# Patient Record
Sex: Male | Born: 1965 | Race: Black or African American | Hispanic: No | Marital: Married | Smoking: Former smoker
Health system: Southern US, Community
[De-identification: ages and names within clinical notes are randomized; demographics above are authoritative.]

## PROBLEM LIST (undated history)

## (undated) DIAGNOSIS — M1990 Unspecified osteoarthritis, unspecified site: Secondary | ICD-10-CM

## (undated) DIAGNOSIS — I82409 Acute embolism and thrombosis of unspecified deep veins of unspecified lower extremity: Secondary | ICD-10-CM

## (undated) DIAGNOSIS — D869 Sarcoidosis, unspecified: Secondary | ICD-10-CM

## (undated) DIAGNOSIS — R011 Cardiac murmur, unspecified: Secondary | ICD-10-CM

## (undated) DIAGNOSIS — R079 Chest pain, unspecified: Secondary | ICD-10-CM

## (undated) DIAGNOSIS — Z9989 Dependence on other enabling machines and devices: Secondary | ICD-10-CM

## (undated) DIAGNOSIS — M545 Low back pain, unspecified: Secondary | ICD-10-CM

## (undated) DIAGNOSIS — M797 Fibromyalgia: Secondary | ICD-10-CM

## (undated) DIAGNOSIS — R9439 Abnormal result of other cardiovascular function study: Secondary | ICD-10-CM

## (undated) DIAGNOSIS — N183 Chronic kidney disease, stage 3 unspecified: Secondary | ICD-10-CM

## (undated) DIAGNOSIS — I38 Endocarditis, valve unspecified: Secondary | ICD-10-CM

## (undated) DIAGNOSIS — G4733 Obstructive sleep apnea (adult) (pediatric): Secondary | ICD-10-CM

## (undated) DIAGNOSIS — J45909 Unspecified asthma, uncomplicated: Secondary | ICD-10-CM

## (undated) DIAGNOSIS — G8929 Other chronic pain: Secondary | ICD-10-CM

## (undated) DIAGNOSIS — I319 Disease of pericardium, unspecified: Secondary | ICD-10-CM

## (undated) HISTORY — DX: Abnormal result of other cardiovascular function study: R94.39

## (undated) HISTORY — DX: Chest pain, unspecified: R07.9

---

## 2006-06-14 ENCOUNTER — Ambulatory Visit: Payer: Self-pay | Admitting: Vascular Surgery

## 2006-06-14 ENCOUNTER — Encounter: Payer: Self-pay | Admitting: Vascular Surgery

## 2006-06-14 ENCOUNTER — Inpatient Hospital Stay (HOSPITAL_COMMUNITY): Admission: EM | Admit: 2006-06-14 | Discharge: 2006-06-16 | Payer: Self-pay | Admitting: Emergency Medicine

## 2006-06-14 ENCOUNTER — Ambulatory Visit: Payer: Self-pay | Admitting: Internal Medicine

## 2006-06-21 ENCOUNTER — Encounter: Payer: Self-pay | Admitting: Internal Medicine

## 2006-06-21 ENCOUNTER — Ambulatory Visit: Payer: Self-pay | Admitting: Hospitalist

## 2006-06-21 LAB — CONVERTED CEMR LAB: INR: 8

## 2006-06-22 ENCOUNTER — Ambulatory Visit: Payer: Self-pay | Admitting: Internal Medicine

## 2006-06-22 ENCOUNTER — Encounter: Payer: Self-pay | Admitting: Internal Medicine

## 2006-06-22 DIAGNOSIS — I82409 Acute embolism and thrombosis of unspecified deep veins of unspecified lower extremity: Secondary | ICD-10-CM

## 2006-06-22 DIAGNOSIS — N189 Chronic kidney disease, unspecified: Secondary | ICD-10-CM | POA: Insufficient documentation

## 2006-06-22 DIAGNOSIS — D869 Sarcoidosis, unspecified: Secondary | ICD-10-CM

## 2006-06-22 LAB — CONVERTED CEMR LAB
BUN: 16 mg/dL (ref 6–23)
Bilirubin Urine: NEGATIVE
CO2: 29 meq/L (ref 19–32)
Calcium: 9.8 mg/dL (ref 8.4–10.5)
Chloride: 101 meq/L (ref 96–112)
Creatinine, Ser: 1.41 mg/dL (ref 0.40–1.50)
Glucose, Bld: 104 mg/dL — ABNORMAL HIGH (ref 70–99)
Hemoglobin, Urine: NEGATIVE
Ketones, ur: NEGATIVE mg/dL
Leukocytes, UA: NEGATIVE
Nitrite: NEGATIVE
Potassium: 4.6 meq/L (ref 3.5–5.3)
Protein, ur: NEGATIVE mg/dL
Sodium: 136 meq/L (ref 135–145)
Specific Gravity, Urine: 1.02 (ref 1.005–1.03)
Urine Glucose: NEGATIVE mg/dL
Urobilinogen, UA: 0.2 (ref 0.0–1.0)
pH: 7.5 (ref 5.0–8.0)

## 2006-06-25 ENCOUNTER — Ambulatory Visit: Payer: Self-pay | Admitting: Internal Medicine

## 2006-06-25 LAB — CONVERTED CEMR LAB: INR: 2.9

## 2006-07-02 ENCOUNTER — Ambulatory Visit: Payer: Self-pay | Admitting: Hospitalist

## 2006-07-02 LAB — CONVERTED CEMR LAB: INR: 7.6

## 2006-07-09 ENCOUNTER — Telehealth: Payer: Self-pay | Admitting: *Deleted

## 2006-07-16 ENCOUNTER — Ambulatory Visit: Payer: Self-pay | Admitting: Internal Medicine

## 2006-07-16 LAB — CONVERTED CEMR LAB: INR: 2

## 2006-07-27 ENCOUNTER — Ambulatory Visit: Payer: Self-pay | Admitting: Hospitalist

## 2006-07-27 LAB — CONVERTED CEMR LAB: INR: 2

## 2006-08-09 ENCOUNTER — Ambulatory Visit: Payer: Self-pay | Admitting: Internal Medicine

## 2006-08-09 LAB — CONVERTED CEMR LAB: INR: 4.1

## 2006-08-30 ENCOUNTER — Ambulatory Visit: Payer: Self-pay | Admitting: Internal Medicine

## 2006-08-30 LAB — CONVERTED CEMR LAB: INR: 3.8

## 2006-10-04 ENCOUNTER — Ambulatory Visit: Payer: Self-pay | Admitting: *Deleted

## 2006-10-04 LAB — CONVERTED CEMR LAB: INR: 4.8

## 2006-10-20 ENCOUNTER — Ambulatory Visit: Payer: Self-pay | Admitting: Infectious Disease

## 2006-10-20 LAB — CONVERTED CEMR LAB: INR: 3.2

## 2006-11-15 ENCOUNTER — Ambulatory Visit: Payer: Self-pay | Admitting: Internal Medicine

## 2006-11-15 LAB — CONVERTED CEMR LAB: INR: 2

## 2006-12-20 ENCOUNTER — Ambulatory Visit: Payer: Self-pay | Admitting: Infectious Diseases

## 2006-12-20 LAB — CONVERTED CEMR LAB: INR: 3.3

## 2007-01-17 ENCOUNTER — Ambulatory Visit: Payer: Self-pay | Admitting: *Deleted

## 2007-01-17 LAB — CONVERTED CEMR LAB: INR: 3

## 2007-02-21 ENCOUNTER — Ambulatory Visit: Payer: Self-pay | Admitting: Infectious Diseases

## 2007-02-21 LAB — CONVERTED CEMR LAB: INR: 3.2

## 2007-03-21 ENCOUNTER — Ambulatory Visit: Payer: Self-pay | Admitting: Internal Medicine

## 2007-03-21 LAB — CONVERTED CEMR LAB: INR: 1.9

## 2007-04-25 ENCOUNTER — Ambulatory Visit: Payer: Self-pay | Admitting: Hospitalist

## 2007-04-25 LAB — CONVERTED CEMR LAB: INR: 3.8

## 2007-07-18 ENCOUNTER — Ambulatory Visit: Payer: Self-pay | Admitting: Internal Medicine

## 2007-07-18 LAB — CONVERTED CEMR LAB: INR: 2.3

## 2007-10-17 ENCOUNTER — Ambulatory Visit: Admission: RE | Admit: 2007-10-17 | Discharge: 2007-10-17 | Payer: Self-pay | Admitting: Internal Medicine

## 2007-10-17 ENCOUNTER — Encounter (INDEPENDENT_AMBULATORY_CARE_PROVIDER_SITE_OTHER): Payer: Self-pay | Admitting: Internal Medicine

## 2007-10-17 ENCOUNTER — Ambulatory Visit: Payer: Self-pay | Admitting: Vascular Surgery

## 2007-10-17 ENCOUNTER — Ambulatory Visit: Payer: Self-pay | Admitting: Internal Medicine

## 2007-10-17 DIAGNOSIS — R609 Edema, unspecified: Secondary | ICD-10-CM | POA: Insufficient documentation

## 2007-10-17 LAB — CONVERTED CEMR LAB: INR: 2.1

## 2008-01-11 ENCOUNTER — Ambulatory Visit: Payer: Self-pay | Admitting: *Deleted

## 2008-01-11 ENCOUNTER — Encounter (INDEPENDENT_AMBULATORY_CARE_PROVIDER_SITE_OTHER): Payer: Self-pay | Admitting: Pharmacist

## 2008-01-11 LAB — CONVERTED CEMR LAB
HCT: 42.9 % (ref 39.0–52.0)
Hemoglobin: 14.4 g/dL (ref 13.0–17.0)
INR: 6
INR: 6 (ref 0.0–1.5)
MCHC: 33.6 g/dL (ref 30.0–36.0)
MCV: 95 fL (ref 78.0–100.0)
Platelets: 231 10*3/uL (ref 150–400)
Prothrombin Time: 59.3 s — ABNORMAL HIGH (ref 11.6–15.2)
RBC: 4.52 M/uL (ref 4.22–5.81)
RDW: 13.1 % (ref 11.5–15.5)
WBC: 3.7 10*3/uL — ABNORMAL LOW (ref 4.0–10.5)

## 2008-01-16 ENCOUNTER — Ambulatory Visit: Payer: Self-pay | Admitting: *Deleted

## 2008-01-16 LAB — CONVERTED CEMR LAB

## 2008-11-10 ENCOUNTER — Observation Stay (HOSPITAL_COMMUNITY): Admission: EM | Admit: 2008-11-10 | Discharge: 2008-11-11 | Payer: Self-pay | Admitting: Emergency Medicine

## 2010-03-09 HISTORY — PX: CATARACT EXTRACTION W/ INTRAOCULAR LENS  IMPLANT, BILATERAL: SHX1307

## 2010-03-09 HISTORY — PX: GLAUCOMA SURGERY: SHX656

## 2010-03-13 ENCOUNTER — Emergency Department (HOSPITAL_COMMUNITY)
Admission: EM | Admit: 2010-03-13 | Discharge: 2010-03-13 | Payer: Self-pay | Source: Home / Self Care | Admitting: Emergency Medicine

## 2010-03-13 LAB — DIFFERENTIAL
Basophils Absolute: 0 10*3/uL (ref 0.0–0.1)
Basophils Relative: 1 % (ref 0–1)
Eosinophils Absolute: 0.4 10*3/uL (ref 0.0–0.7)
Eosinophils Relative: 10 % — ABNORMAL HIGH (ref 0–5)
Lymphocytes Relative: 41 % (ref 12–46)
Lymphs Abs: 1.7 10*3/uL (ref 0.7–4.0)
Monocytes Absolute: 0.5 10*3/uL (ref 0.1–1.0)
Monocytes Relative: 12 % (ref 3–12)
Neutro Abs: 1.5 10*3/uL — ABNORMAL LOW (ref 1.7–7.7)
Neutrophils Relative %: 37 % — ABNORMAL LOW (ref 43–77)

## 2010-03-13 LAB — BASIC METABOLIC PANEL
BUN: 14 mg/dL (ref 6–23)
CO2: 26 mEq/L (ref 19–32)
Calcium: 9.4 mg/dL (ref 8.4–10.5)
Chloride: 105 mEq/L (ref 96–112)
Creatinine, Ser: 1.46 mg/dL (ref 0.4–1.5)
GFR calc Af Amer: >60 mL/min (ref >60)
GFR calc non Af Amer: 52 mL/min — ABNORMAL LOW (ref >60)
Glucose, Bld: 104 mg/dL — ABNORMAL HIGH (ref 70–99)
Potassium: 4.1 mEq/L (ref 3.5–5.1)
Sodium: 138 mEq/L (ref 135–145)

## 2010-03-13 LAB — CBC
HCT: 39 % (ref 39.0–52.0)
Hemoglobin: 14.6 g/dL (ref 13.0–17.0)
MCH: 33.7 pg (ref 26.0–34.0)
MCHC: 37.4 g/dL — ABNORMAL HIGH (ref 30.0–36.0)
MCV: 90.1 fL (ref 78.0–100.0)
Platelets: 223 10*3/uL (ref 150–400)
RBC: 4.33 MIL/uL (ref 4.22–5.81)
RDW: 12.7 % (ref 11.5–15.5)
WBC: 4 10*3/uL (ref 4.0–10.5)

## 2010-03-13 LAB — PROTIME-INR
INR: 1.98 — ABNORMAL HIGH (ref 0.00–1.49)
Prothrombin Time: 22.7 seconds — ABNORMAL HIGH (ref 11.6–15.2)

## 2010-06-13 LAB — DIFFERENTIAL
Basophils Absolute: 0 10*3/uL (ref 0.0–0.1)
Basophils Relative: 1 % (ref 0–1)
Eosinophils Absolute: 0.5 10*3/uL (ref 0.0–0.7)
Eosinophils Relative: 13 % — ABNORMAL HIGH (ref 0–5)
Lymphocytes Relative: 24 % (ref 12–46)
Lymphocytes Relative: 24 % (ref 12–46)
Lymphs Abs: 1 10*3/uL (ref 0.7–4.0)
Monocytes Absolute: 0.5 10*3/uL (ref 0.1–1.0)
Monocytes Absolute: 0.9 10*3/uL (ref 0.1–1.0)
Monocytes Relative: 12 % (ref 3–12)
Monocytes Relative: 13 % — ABNORMAL HIGH (ref 3–12)
Neutro Abs: 2.2 10*3/uL (ref 1.7–7.7)
Neutro Abs: 4 10*3/uL (ref 1.7–7.7)
Neutrophils Relative %: 51 % (ref 43–77)
Neutrophils Relative %: 61 % (ref 43–77)

## 2010-06-13 LAB — URINALYSIS, ROUTINE W REFLEX MICROSCOPIC
Bilirubin Urine: NEGATIVE
Glucose, UA: NEGATIVE mg/dL
Hgb urine dipstick: NEGATIVE
Ketones, ur: NEGATIVE mg/dL
Nitrite: NEGATIVE
Protein, ur: NEGATIVE mg/dL
Specific Gravity, Urine: 1.027 (ref 1.005–1.030)
Urobilinogen, UA: 0.2 mg/dL (ref 0.0–1.0)
pH: 6 (ref 5.0–8.0)

## 2010-06-13 LAB — CK TOTAL AND CKMB (NOT AT ARMC)
CK, MB: 4.2 ng/mL — ABNORMAL HIGH (ref 0.3–4.0)
Relative Index: 1.5 (ref 0.0–2.5)
Total CK: 287 U/L — ABNORMAL HIGH (ref 7–232)

## 2010-06-13 LAB — CARDIAC PANEL(CRET KIN+CKTOT+MB+TROPI)
CK, MB: 3.5 ng/mL (ref 0.3–4.0)
Relative Index: 1.6 (ref 0.0–2.5)
Relative Index: 1.9 (ref 0.0–2.5)
Troponin I: 0.01 ng/mL (ref 0.00–0.06)
Troponin I: 0.02 ng/mL (ref 0.00–0.06)

## 2010-06-13 LAB — COMPREHENSIVE METABOLIC PANEL
Albumin: 3.2 g/dL — ABNORMAL LOW (ref 3.5–5.2)
BUN: 19 mg/dL (ref 6–23)
Calcium: 9.1 mg/dL (ref 8.4–10.5)
Creatinine, Ser: 1.33 mg/dL (ref 0.4–1.5)
Glucose, Bld: 146 mg/dL — ABNORMAL HIGH (ref 70–99)
Total Protein: 6.6 g/dL (ref 6.0–8.3)

## 2010-06-13 LAB — PROTIME-INR
INR: 2.6 — ABNORMAL HIGH (ref 0.00–1.49)
INR: 3.1 — ABNORMAL HIGH (ref 0.00–1.49)
Prothrombin Time: 27.6 seconds — ABNORMAL HIGH (ref 11.6–15.2)
Prothrombin Time: 31.6 seconds — ABNORMAL HIGH (ref 11.6–15.2)

## 2010-06-13 LAB — CBC
HCT: 37 % — ABNORMAL LOW (ref 39.0–52.0)
HCT: 42.3 % (ref 39.0–52.0)
Hemoglobin: 14.7 g/dL (ref 13.0–17.0)
MCHC: 34.6 g/dL (ref 30.0–36.0)
MCHC: 34.7 g/dL (ref 30.0–36.0)
MCV: 93.5 fL (ref 78.0–100.0)
MCV: 93.7 fL (ref 78.0–100.0)
Platelets: 304 10*3/uL (ref 150–400)
Platelets: 328 10*3/uL (ref 150–400)
RBC: 4.52 MIL/uL (ref 4.22–5.81)
RDW: 13.3 % (ref 11.5–15.5)
RDW: 13.6 % (ref 11.5–15.5)
WBC: 4.3 10*3/uL (ref 4.0–10.5)

## 2010-06-13 LAB — POCT CARDIAC MARKERS
CKMB, poc: 2.4 ng/mL (ref 1.0–8.0)
CKMB, poc: 3.2 ng/mL (ref 1.0–8.0)
CKMB, poc: 4.6 ng/mL (ref 1.0–8.0)
Myoglobin, poc: 185 ng/mL (ref 12–200)
Myoglobin, poc: 227 ng/mL (ref 12–200)
Troponin i, poc: <0.05 ng/mL (ref 0.00–0.09)
Troponin i, poc: <0.05 ng/mL (ref 0.00–0.09)

## 2010-06-13 LAB — BRAIN NATRIURETIC PEPTIDE: Pro B Natriuretic peptide (BNP): <30 pg/mL (ref 0.0–100.0)

## 2010-06-13 LAB — BASIC METABOLIC PANEL
BUN: 17 mg/dL (ref 6–23)
CO2: 28 mEq/L (ref 19–32)
Calcium: 9.7 mg/dL (ref 8.4–10.5)
Chloride: 104 mEq/L (ref 96–112)
Creatinine, Ser: 1.29 mg/dL (ref 0.4–1.5)
GFR calc Af Amer: >60 mL/min (ref >60)
GFR calc non Af Amer: >60 mL/min (ref >60)
Glucose, Bld: 105 mg/dL — ABNORMAL HIGH (ref 70–99)
Potassium: 4.4 mEq/L (ref 3.5–5.1)
Sodium: 139 mEq/L (ref 135–145)

## 2010-07-25 NOTE — Discharge Summary (Signed)
Richard Shepherd, Richard Shepherd             ACCOUNT NO.:  [id number]   MEDICAL RECORD NO.:  [id number]          PATIENT TYPE:  INP   LOCATION:  5511                         FACILITY:  [facility]   PHYSICIAN:  Richard Shepherd, M.D.DATE OF BIRTH:  [birth year]   DATE OF ADMISSION:  06/14/2006  DATE OF DISCHARGE:  06/16/2006                               DISCHARGE SUMMARY   DISCHARGE DIAGNOSIS:  Deep vein thrombosis of the right lower extremity,  namely the popliteal vein to distal femoral vein.   CHRONIC DIAGNOSES:  1. Sarcoidosis diagnosed in 1998, with complications including uveitis      and has been prednisone dependent.  2. Elevated creatinine which resolved.   DISCHARGE MEDICATIONS:  1. Lovenox 105 mg subcutaneous injection once every 12 hours until his      Monday appointment with Dr. Alexandria Shepherd.  2. Coumadin 10 mg by mouth once a day also until his Monday      appointment with Dr. Alexandria Shepherd in the Coumadin clinic at which point      his Coumadin dose will be adjusted and it will be determined      whether he needs to continue his Lovenox dose.  3. Prednisone 20 mg by mouth once a day.  4. Homatropine 5% ophthalmic solution 2 drops in each eye twice a day.  5. Percocet 5/325 one tablet by mouth once every 4-6 hours as needed      for pain.   DISPOSITION AND FOLLOWUP:  [name] was stable at discharge with  easing of the pain in his right lower extremity.  Since this is his  second DVT, he had a prior unmanipulated deep vein thrombosis in July  2007 for which he had 6 months of treatment with Coumadin.  He will  require life-long anticoagulation most likely.  He is to follow up with  Dr. Alexandria Shepherd in the outpatient clinic on Monday, April 14th, at 2:45 p.m.  At this time, he will have a repeat PT/INR check to determine if his  Coumadin levels are therapeutic and then it will be determined if he  needs to remain on Lovenox for 2 additional days and what dose of  Coumadin he can be continued on  will be determined in the future.  He  will also see Dr. Phillips Shepherd in the outpatient clinic on Tuesday, April  15th, at 1:30 p.m. to establish care.  Also, he will have a recheck of  his BMET to make sure that his kidney function has remained within  normal limits since his discharge.  It should also be determined if he  has had resolution of his urinary symptoms.  He had been complaining of  polyuria and incomplete emptying on voiding without any pain and he has  never had kidney stones.  Did have a limited workup of his acute renal  failure including a UA which was negative, a FeNa which was 0.8, and  urinary calcium check which was only 5 as, with sarcoidosis, there could  be an associated renal involvement, though usually with hypercalcemia  which he did not have.  Also at this time, it  can be seen if we have  been able to obtain his records from the [facility] in [place], where he  used to have his care looked after.  They were transferring charts but  it is very likely that it will not arrive as they said it would have a  lag time of up to 2 weeks.  Also, it can be determined at this time if  he will be following up with the [facility] outpatient clinic in [place]  and we can also, finally at this time, determine if at any point he will  need to have a repeat chest x-ray done to assess his sarcoidosis and we  will need to make sure that he is seeing an ophthalmologist as he has  not seen one within the last 7 months and this is someone that he needs  to follow up with urgently in reference to his uveitis.  No procedures  were performed or consultations obtained during this hospitalization.   BRIEF ADMITTING HISTORY AND PHYSICAL:  [name] is a 45 year old  male, with a past medical history of sarcoidosis requiring chronic  prednisone therapy and a deep vein thrombosis in July 2007 requiring 6  months of Coumadin therapy, who comes in complaining of right leg  swelling and cramps when  walking that occurred the morning of admission.  The pain begins in his calf and goes up to the thigh and there is also  an associated numbness.  Denies any other respiratory symptoms.   Temperature 97.8, blood pressure 135/84, pulse 78, respiratory rate 16,  O2 sat 96% on room air.  He was in no apparent distress.  EYES:  Were anicteric.  He had no pallor.  LUNGS:  Sounds were clear to auscultation bilaterally.  No wheezes, no  crackles.  Good air movement.  No evidence of respiratory distress.  CARDIOVASCULAR EXAM:  Regular rate and rhythm, S1, S2, no murmurs.  EXTREMITIES:  He was noted to have right leg swelling with positive  Homans sign and intact pulses and his calf was painful to palpitation in  the popliteal region but he did not have a palpable cord or evidence of  erythema.   Sodium 137, potassium 4.3, chloride 105, bicarb 30 for an anion gap of  2, BUN 13, creatinine was 1.9, glucose 104, hemoglobin 16.2, MCV 96.2,  platelets 270,000, white blood cell count 5.9, PT 12.3, INR 0.9, PTT 28.   For more detailed history and physical, please refer to the chart.   HOSPITAL COURSE:  1. Right leg tenderness and swelling, positive Homans sign.  An      ultrasound was obtained which showed a fairly large DVT in the      popliteal region extending to the femoral vein.  He was started on      Lovenox and Coumadin while keeping his leg elevated.  A      hypercoagulable panel was obtained which was essentially      inconclusive.  Did not show any potential cause for his repeat      DVTs.  The only notable labs were his total protein S was mildly      elevated at 200 and his functional protein C was mildly elevated at      152.  No real explanation for his repeat DVT.  With his pain      resolving and his receiving therapy and being agreeable to      continuing Lovenox on an outpatient basis until  early followup, it     was decided that he could be discharged home.  During his hospital       course, he did not have any symptoms of PE.  He had no shortness of      breath, no tachycardia.  An EKG was within normal limits.  No      evidence of right heart strain.  He was advised that, should he      develop any of these symptoms, to return to the hospital urgently.  2. Elevated creatinine.  When he came, it was 1.9, however, he had      been urinating without difficulty, with adequate urine output.  He      did have some polyuria, incomplete emptying the week prior but,      without IV hydration, his creatinine trended down during his      hospital course.  A workup was obtained to see if there was an      association with a sarcoidosis but urine calcium was negative, UA      was negative, FeNa was within normal limits and, again, his      creatinine resolved with no hydration.  This is something that we      will need to follow up with on an outpatient basis just to confirm      that he is not having any further difficulty with his kidneys.  Of      note was that he was taking multiple weight-gaining and muscle      building substances, which names he did not recall.  This is just      possibly associated with his elevated creatinine and he was advised      to stop taking any of these herbal supplements in the future.  3. Sarcoidosis, prednisone dependent, with history of uveitis.  He is      running out of his eye solution and his vision is somewhat blurry      and he states that, whenever he goes off of his prednisone, he      suffers from fatigue and severe arthritis.  He was continued on his      prednisone dose and was advised to obtain an ophthalmology      consultation at his earliest convenience as he does have insurance.      He can make the appointment on his own.  We will need to follow up      with him on this when he returns to the outpatient clinic because      this is something that he needs to have done urgently.  We also      attempted to obtain records from  the [facility] but they will not arrive      for another 2 weeks after his discharge.   DISCHARGE LABS:  Sodium 138, potassium 4.1, chloride 102, bicarb 31,  glucose 99, BUN 13, creatinine 1.5 for a GFR above 60, calcium 9.3.  White blood cell count 5.4, hemoglobin 15.5, hematocrit 45, platelets  283,000, PT 15.2, INR 1.2.  A urine culture returned negative.   VITAL SIGNS:  Temperature 97.9, pulse 75, respirations 20, blood  pressure 140/85, O2 saturation 97% on room air.      Valetta Close, M.D.  Electronically Signed      Richard Shepherd, M.D.  Electronically Signed    JC/MEDQ  D:  06/18/2006  T:  06/18/2006  Job:  [id number]  cc:   Edsel Petrin, D.O.

## 2011-01-28 ENCOUNTER — Emergency Department (HOSPITAL_COMMUNITY)
Admission: EM | Admit: 2011-01-28 | Discharge: 2011-01-29 | Disposition: A | Discharge status: Home / Self Care | Payer: Medicare Other | Attending: Emergency Medicine | Admitting: Emergency Medicine

## 2011-01-28 DIAGNOSIS — M79606 Pain in leg, unspecified: Secondary | ICD-10-CM

## 2011-01-28 DIAGNOSIS — M79609 Pain in unspecified limb: Secondary | ICD-10-CM

## 2011-01-28 DIAGNOSIS — IMO0001 Reserved for inherently not codable concepts without codable children: Secondary | ICD-10-CM | POA: Insufficient documentation

## 2011-01-28 DIAGNOSIS — Z86718 Personal history of other venous thrombosis and embolism: Secondary | ICD-10-CM | POA: Insufficient documentation

## 2011-01-28 DIAGNOSIS — D869 Sarcoidosis, unspecified: Secondary | ICD-10-CM | POA: Insufficient documentation

## 2011-01-28 DIAGNOSIS — Z79899 Other long term (current) drug therapy: Secondary | ICD-10-CM | POA: Insufficient documentation

## 2011-01-28 DIAGNOSIS — M6282 Rhabdomyolysis: Secondary | ICD-10-CM | POA: Insufficient documentation

## 2011-01-28 DIAGNOSIS — M7989 Other specified soft tissue disorders: Secondary | ICD-10-CM

## 2011-01-28 DIAGNOSIS — Z7901 Long term (current) use of anticoagulants: Secondary | ICD-10-CM | POA: Insufficient documentation

## 2011-01-28 DIAGNOSIS — M2550 Pain in unspecified joint: Secondary | ICD-10-CM | POA: Insufficient documentation

## 2011-01-28 DIAGNOSIS — Z9889 Other specified postprocedural states: Secondary | ICD-10-CM | POA: Insufficient documentation

## 2011-01-28 HISTORY — DX: Sarcoidosis, unspecified: D86.9

## 2011-01-28 LAB — CBC
HCT: 43 % (ref 39.0–52.0)
MCV: 90.7 fL (ref 78.0–100.0)
Platelets: 244 10*3/uL (ref 150–400)
RBC: 4.74 MIL/uL (ref 4.22–5.81)
RDW: 13.4 % (ref 11.5–15.5)
WBC: 3.6 10*3/uL — ABNORMAL LOW (ref 4.0–10.5)

## 2011-01-28 LAB — PROTIME-INR: INR: 2.37 — ABNORMAL HIGH (ref 0.00–1.49)

## 2011-01-28 LAB — CK: Total CK: 768 U/L — ABNORMAL HIGH (ref 7–232)

## 2011-01-28 LAB — BASIC METABOLIC PANEL
CO2: 28 mEq/L (ref 19–32)
Calcium: 9.3 mg/dL (ref 8.4–10.5)
GFR calc non Af Amer: 59 mL/min — ABNORMAL LOW (ref >90)
Glucose, Bld: 97 mg/dL (ref 70–99)
Potassium: 4.4 mEq/L (ref 3.5–5.1)
Sodium: 137 mEq/L (ref 135–145)

## 2011-01-28 MED ORDER — SODIUM CHLORIDE 0.9 % IV BOLUS (SEPSIS): 1000.0000 mL | Freq: Once | INTRAVENOUS | Status: DC

## 2011-01-28 NOTE — ED Notes (Signed)
Dr. Manus Gunning notified re: elevated total CK. Order received for NS bolus.

## 2011-01-28 NOTE — ED Notes (Signed)
Pt taking water PO.

## 2011-01-28 NOTE — Progress Notes (Signed)
Bilateral lower extremity venous duplex completed - No evidence of lower extremity DVT, superficial thrombosis, or Baker's cyst bilaterally.  Richard Shepherd, [place] D 01/28/2011, 2:24 PM

## 2011-01-28 NOTE — ED Provider Notes (Signed)
History     CSN: [id number] Arrival date & time: 01/28/2011  1:11 PM   First MD Initiated Contact with Patient 01/28/11 1333      Chief Complaint  Patient presents with  . Leg Pain    (Consider location/radiation/quality/duration/timing/severity/associated sxs/prior treatment) HPI Comments: Patient presents with soreness and pain in his left calf for the past 3 days. He denies any trauma. He is worried he has a DVT as he has a history of DVT on the right side. He is currently on Coumadin and followed at the [facility] in [place]. He states his INR was decreased at 1.4 a week ago. He denies any chest pain, shortness of breath, cough or fever. Denies any weakness, numbness or tingling to lower 70s. He is able to ambulate without difficulty.  The history is provided by the patient.    Past Medical History  Diagnosis Date  . DVT (deep vein thrombosis) in pregnancy   . Sarcoidosis     Past Surgical History  Procedure Date  . Glaucoma surgery   . Cataract extraction     History reviewed. No pertinent family history.  History  Substance Use Topics  . Smoking status: Never Smoker   . Smokeless tobacco: Not on file  . Alcohol Use: Yes      Review of Systems  Constitutional: Negative for fever, activity change and appetite change.  HENT: Negative for congestion and rhinorrhea.   Respiratory: Negative for cough and shortness of breath.   Cardiovascular: Negative for chest pain.  Gastrointestinal: Negative for nausea, vomiting and diarrhea.  Genitourinary: Negative for dysuria.  Musculoskeletal: Positive for myalgias and arthralgias. Negative for back pain and gait problem.  Neurological: Negative for headaches.    Allergies  Review of patient's allergies indicates no known allergies.  Home Medications   Current Outpatient Rx  Name Route Sig Dispense Refill  . CALCIUM PO Oral Take 1 tablet by mouth daily.      Marland Kitchen VITAMIN D3 1000 UNITS PO CAPS Oral Take 1 capsule by  mouth daily.      Marland Kitchen GABAPENTIN 600 MG PO TABS Oral Take 1,200 mg by mouth at bedtime.      Marland Kitchen HOMATROPINE HBR 2 % OP SOLN Both Eyes Place 2 drops into both eyes 2 (two) times daily.      Marland Kitchen NAPROXEN SODIUM 220 MG PO TABS Oral Take 220 mg by mouth 2 (two) times daily as needed. For pain     . PREDNISOLONE ACETATE 1 % OP SUSP Both Eyes Place 1 drop into both eyes 6 (six) times daily.      . TRAMADOL HCL 50 MG PO TABS Oral Take 50 mg by mouth every 6 (six) hours as needed. Maximum dose= 8 tablets per day For pain      . WARFARIN SODIUM 5 MG PO TABS Oral Take 5 mg by mouth daily.        BP 141/91  Pulse 65  Temp(Src) 97 F (36.1 C) (Oral)  Resp 18  Ht 5\' 11"  (1.803 m)  Wt 250 lb (113.399 kg)  BMI 34.87 kg/m2  SpO2 99%  Physical Exam  Constitutional: He is oriented to person, place, and time. He appears well-developed and well-nourished. No distress.  HENT:  Head: Normocephalic and atraumatic.  Mouth/Throat: Oropharynx is clear and moist. No oropharyngeal exudate.  Eyes: Conjunctivae are normal. Pupils are equal, round, and reactive to light.  Neck: Normal range of motion.  Cardiovascular: Normal rate, regular rhythm and normal heart  sounds.   Pulmonary/Chest: Effort normal and breath sounds normal.  Abdominal: Soft. Bowel sounds are normal. There is no tenderness. There is no rebound and no guarding.  Musculoskeletal: Normal range of motion. He exhibits tenderness.       There is no appreciable asymmetry lower extremities.  He does have bilateral calf tenderness. He is able to wiggle his toes, +2 DP and PT pulses bilaterally.  Neurological: He is alert and oriented to person, place, and time. No cranial nerve deficit.  Skin: Skin is warm.    ED Course  Procedures (including critical care time)  Labs Reviewed  CBC - Abnormal; Notable for the following:    WBC 3.6 (*)    All other components within normal limits  PROTIME-INR - Abnormal; Notable for the following:    Prothrombin  Time 26.3 (*)    INR 2.37 (*)    All other components within normal limits  BASIC METABOLIC PANEL - Abnormal; Notable for the following:    Creatinine, Ser 1.40 (*)    GFR calc non Af Amer 59 (*)    GFR calc Af Amer 69 (*)    All other components within normal limits  CK - Abnormal; Notable for the following:    Total CK 768 (*)    All other components within normal limits   No results found.   1. Leg pain   2. Rhabdomyolysis       MDM  Lower extremity pain with history of DVT. Patient reports last Coumadin level subtherapeutic. We'll obtain a duplex imaging of lower extremities, check INR.  He is not having chest pain or shortness of breath, tachypnea or hypoxia to suggest PE.  No evidence of DVT on Doppler. Coumadin is therapeutic at 2.37. Elevated CK noted.  Creatinine is stable at 1.4.  The patient is elevated CK concerning for rhabdomyolysis and likely contributing to his pain.  He declines IV fluids in the emergency department and states she will drink by mouth.  He has a follow up appointment scheduled at the [facility] in [place] on the 26th. He is instructed that he needs to have his creatinine and CK rechecked at that time.  He should return to the ED with any new or worsening symptoms.     Glynn Octave, MD 01/28/11 2024

## 2011-01-28 NOTE — ED Notes (Signed)
Pt presents with 3 day h/o L leg pain and swelling.  Pt has h/o DVT to R leg, is on coumadin for same.

## 2011-01-28 NOTE — ED Notes (Signed)
slight edema in left ankle

## 2012-11-22 DIAGNOSIS — Z961 Presence of intraocular lens: Secondary | ICD-10-CM | POA: Insufficient documentation

## 2013-03-09 HISTORY — PX: EYE SURGERY: SHX253

## 2013-08-16 ENCOUNTER — Emergency Department (INDEPENDENT_AMBULATORY_CARE_PROVIDER_SITE_OTHER)
Admission: EM | Admit: 2013-08-16 | Discharge: 2013-08-16 | Disposition: A | Discharge status: Nursing Home (Includes ICF) | Payer: Medicare Other | Source: Home / Self Care | Attending: Emergency Medicine | Admitting: Emergency Medicine

## 2013-08-16 ENCOUNTER — Emergency Department (HOSPITAL_COMMUNITY): Payer: Medicare Other

## 2013-08-16 ENCOUNTER — Inpatient Hospital Stay (HOSPITAL_COMMUNITY)
Admission: EM | Admit: 2013-08-16 | Discharge: 2013-08-18 | DRG: 316 | Disposition: A | Discharge status: Home / Self Care | Payer: Medicare Other | Attending: Cardiovascular Disease | Admitting: Cardiovascular Disease

## 2013-08-16 ENCOUNTER — Encounter (HOSPITAL_COMMUNITY)
Admission: EM | Disposition: A | Discharge status: Home / Self Care | Payer: Self-pay | Source: Home / Self Care | Attending: Cardiovascular Disease

## 2013-08-16 ENCOUNTER — Encounter (HOSPITAL_COMMUNITY): Payer: Self-pay | Admitting: Emergency Medicine

## 2013-08-16 DIAGNOSIS — J45909 Unspecified asthma, uncomplicated: Secondary | ICD-10-CM | POA: Diagnosis present

## 2013-08-16 DIAGNOSIS — I219 Acute myocardial infarction, unspecified: Secondary | ICD-10-CM

## 2013-08-16 DIAGNOSIS — B029 Zoster without complications: Secondary | ICD-10-CM | POA: Diagnosis present

## 2013-08-16 DIAGNOSIS — Z79899 Other long term (current) drug therapy: Secondary | ICD-10-CM

## 2013-08-16 DIAGNOSIS — I319 Disease of pericardium, unspecified: Principal | ICD-10-CM | POA: Diagnosis present

## 2013-08-16 DIAGNOSIS — R609 Edema, unspecified: Secondary | ICD-10-CM | POA: Diagnosis present

## 2013-08-16 DIAGNOSIS — N189 Chronic kidney disease, unspecified: Secondary | ICD-10-CM | POA: Diagnosis present

## 2013-08-16 DIAGNOSIS — D869 Sarcoidosis, unspecified: Secondary | ICD-10-CM | POA: Diagnosis present

## 2013-08-16 DIAGNOSIS — R079 Chest pain, unspecified: Secondary | ICD-10-CM | POA: Diagnosis present

## 2013-08-16 DIAGNOSIS — G589 Mononeuropathy, unspecified: Secondary | ICD-10-CM | POA: Diagnosis present

## 2013-08-16 DIAGNOSIS — Z7901 Long term (current) use of anticoagulants: Secondary | ICD-10-CM

## 2013-08-16 DIAGNOSIS — Z86718 Personal history of other venous thrombosis and embolism: Secondary | ICD-10-CM

## 2013-08-16 DIAGNOSIS — N182 Chronic kidney disease, stage 2 (mild): Secondary | ICD-10-CM | POA: Diagnosis present

## 2013-08-16 DIAGNOSIS — I213 ST elevation (STEMI) myocardial infarction of unspecified site: Secondary | ICD-10-CM

## 2013-08-16 DIAGNOSIS — Z9849 Cataract extraction status, unspecified eye: Secondary | ICD-10-CM

## 2013-08-16 HISTORY — DX: Unspecified asthma, uncomplicated: J45.909

## 2013-08-16 HISTORY — DX: Acute embolism and thrombosis of unspecified deep veins of unspecified lower extremity: I82.409

## 2013-08-16 LAB — CBC
HCT: 35 % — ABNORMAL LOW (ref 39.0–52.0)
Hemoglobin: 11.9 g/dL — ABNORMAL LOW (ref 13.0–17.0)
MCH: 32.4 pg (ref 26.0–34.0)
MCHC: 34 g/dL (ref 30.0–36.0)
MCV: 95.4 fL (ref 78.0–100.0)
PLATELETS: 294 10*3/uL (ref 150–400)
RBC: 3.67 MIL/uL — AB (ref 4.22–5.81)
RDW: 12.7 % (ref 11.5–15.5)
WBC: 9.1 10*3/uL (ref 4.0–10.5)

## 2013-08-16 LAB — COMPREHENSIVE METABOLIC PANEL
ALBUMIN: 3.1 g/dL — AB (ref 3.5–5.2)
ALT: 46 U/L (ref 0–53)
AST: 48 U/L — AB (ref 0–37)
Alkaline Phosphatase: 124 U/L — ABNORMAL HIGH (ref 39–117)
BUN: 14 mg/dL (ref 6–23)
CALCIUM: 8.4 mg/dL (ref 8.4–10.5)
CO2: 25 meq/L (ref 19–32)
CREATININE: 1.45 mg/dL — AB (ref 0.50–1.35)
Chloride: 97 mEq/L (ref 96–112)
GFR calc Af Amer: 65 mL/min — ABNORMAL LOW (ref >90)
GFR, EST NON AFRICAN AMERICAN: 56 mL/min — AB (ref >90)
Glucose, Bld: 89 mg/dL (ref 70–99)
Potassium: 4 mEq/L (ref 3.7–5.3)
SODIUM: 136 meq/L — AB (ref 137–147)
Total Bilirubin: 0.5 mg/dL (ref 0.3–1.2)
Total Protein: 7 g/dL (ref 6.0–8.3)

## 2013-08-16 LAB — I-STAT TROPONIN, ED: Troponin i, poc: 0.05 ng/mL (ref 0.00–0.08)

## 2013-08-16 LAB — PROTIME-INR
INR: 2.4 — ABNORMAL HIGH (ref 0.00–1.49)
PROTHROMBIN TIME: 25.4 s — AB (ref 11.6–15.2)

## 2013-08-16 LAB — APTT: aPTT: 64 seconds — ABNORMAL HIGH (ref 24–37)

## 2013-08-16 LAB — SEDIMENTATION RATE: SED RATE: 57 mm/h — AB (ref 0–16)

## 2013-08-16 SURGERY — LEFT HEART CATHETERIZATION WITH CORONARY ANGIOGRAM
Anesthesia: LOCAL

## 2013-08-16 MED ORDER — HOMATROPINE HBR 2 % OP SOLN
2.0000 [drp] | Freq: Two times a day (BID) | OPHTHALMIC | Status: DC
  Administered 2013-08-17 (×2): 2 [drp] via OPHTHALMIC
  Filled 2013-08-16: qty 5

## 2013-08-16 MED ORDER — ASPIRIN 81 MG PO CHEW
CHEWABLE_TABLET | ORAL | Status: AC
  Filled 2013-08-16: qty 4

## 2013-08-16 MED ORDER — HEPARIN (PORCINE) IN NACL 2-0.9 UNIT/ML-% IJ SOLN
INTRAMUSCULAR | Status: AC
  Filled 2013-08-16: qty 1500

## 2013-08-16 MED ORDER — SODIUM CHLORIDE 0.9 % IV SOLN
Freq: Once | INTRAVENOUS | Status: AC
  Administered 2013-08-16: 20:00:00 via INTRAVENOUS

## 2013-08-16 MED ORDER — NITROGLYCERIN 0.4 MG SL SUBL
0.4000 mg | SUBLINGUAL_TABLET | SUBLINGUAL | Status: DC | PRN
  Administered 2013-08-16: 0.4 mg via SUBLINGUAL

## 2013-08-16 MED ORDER — VALACYCLOVIR HCL 500 MG PO TABS
1000.0000 mg | ORAL_TABLET | Freq: Every day | ORAL | Status: DC
  Administered 2013-08-17 – 2013-08-18 (×2): 1000 mg via ORAL
  Filled 2013-08-16 (×2): qty 2

## 2013-08-16 MED ORDER — IBUPROFEN 600 MG PO TABS
600.0000 mg | ORAL_TABLET | Freq: Three times a day (TID) | ORAL | Status: DC
  Administered 2013-08-17 – 2013-08-18 (×5): 600 mg via ORAL
  Filled 2013-08-16 (×7): qty 1

## 2013-08-16 MED ORDER — SODIUM CHLORIDE 0.9 % IJ SOLN
3.0000 mL | Freq: Two times a day (BID) | INTRAMUSCULAR | Status: DC
  Administered 2013-08-17 – 2013-08-18 (×4): 3 mL via INTRAVENOUS

## 2013-08-16 MED ORDER — ASPIRIN 81 MG PO CHEW
324.0000 mg | CHEWABLE_TABLET | Freq: Once | ORAL | Status: AC
  Administered 2013-08-16: 324 mg via ORAL

## 2013-08-16 MED ORDER — IBUPROFEN 800 MG PO TABS
800.0000 mg | ORAL_TABLET | Freq: Once | ORAL | Status: AC
  Administered 2013-08-16: 800 mg via ORAL
  Filled 2013-08-16: qty 1

## 2013-08-16 MED ORDER — NITROGLYCERIN 0.2 MG/ML ON CALL CATH LAB
INTRAVENOUS | Status: AC
  Filled 2013-08-16: qty 1

## 2013-08-16 MED ORDER — BRIMONIDINE TARTRATE 0.2 % OP SOLN
1.0000 [drp] | Freq: Three times a day (TID) | OPHTHALMIC | Status: DC
  Administered 2013-08-17 – 2013-08-18 (×5): 1 [drp] via OPHTHALMIC
  Filled 2013-08-16: qty 5

## 2013-08-16 MED ORDER — PREDNISOLONE ACETATE 1 % OP SUSP
1.0000 [drp] | Freq: Three times a day (TID) | OPHTHALMIC | Status: DC
  Administered 2013-08-17 – 2013-08-18 (×5): 1 [drp] via OPHTHALMIC
  Filled 2013-08-16: qty 1

## 2013-08-16 MED ORDER — KETOROLAC TROMETHAMINE 0.5 % OP SOLN
1.0000 [drp] | Freq: Two times a day (BID) | OPHTHALMIC | Status: DC
  Administered 2013-08-17 – 2013-08-18 (×4): 1 [drp] via OPHTHALMIC
  Filled 2013-08-16: qty 3

## 2013-08-16 MED ORDER — ASPIRIN 81 MG PO CHEW: 324.0000 mg | CHEWABLE_TABLET | Freq: Once | ORAL | Status: DC

## 2013-08-16 MED ORDER — PANTOPRAZOLE SODIUM 40 MG IV SOLR
40.0000 mg | Freq: Every day | INTRAVENOUS | Status: DC
  Administered 2013-08-17 (×2): 40 mg via INTRAVENOUS
  Filled 2013-08-16 (×3): qty 40

## 2013-08-16 MED ORDER — MYCOPHENOLATE MOFETIL 500 MG PO TABS
1000.0000 mg | ORAL_TABLET | Freq: Two times a day (BID) | ORAL | Status: DC
  Administered 2013-08-17: 1000 mg via ORAL
  Filled 2013-08-16 (×3): qty 2

## 2013-08-16 MED ORDER — TRAMADOL HCL 50 MG PO TABS
50.0000 mg | ORAL_TABLET | Freq: Four times a day (QID) | ORAL | Status: DC | PRN
  Administered 2013-08-18: 50 mg via ORAL
  Filled 2013-08-16: qty 1

## 2013-08-16 MED ORDER — NITROGLYCERIN 0.4 MG SL SUBL
SUBLINGUAL_TABLET | SUBLINGUAL | Status: AC
  Filled 2013-08-16: qty 1

## 2013-08-16 MED ORDER — GABAPENTIN 600 MG PO TABS
1200.0000 mg | ORAL_TABLET | Freq: Every day | ORAL | Status: DC
  Administered 2013-08-17 (×2): 1200 mg via ORAL
  Filled 2013-08-16 (×3): qty 2

## 2013-08-16 MED ORDER — SODIUM CHLORIDE 0.9 % IJ SOLN: 3.0000 mL | INTRAMUSCULAR | Status: DC | PRN

## 2013-08-16 MED ORDER — LIDOCAINE HCL (PF) 1 % IJ SOLN
INTRAMUSCULAR | Status: AC
  Filled 2013-08-16: qty 30

## 2013-08-16 MED ORDER — SODIUM CHLORIDE 0.9 % IV SOLN: 250.0000 mL | INTRAVENOUS | Status: DC | PRN

## 2013-08-16 MED ORDER — CALCIUM CARBONATE-VITAMIN D 500-200 MG-UNIT PO TABS
1.0000 | ORAL_TABLET | Freq: Every day | ORAL | Status: DC
  Administered 2013-08-17 – 2013-08-18 (×2): 1 via ORAL
  Filled 2013-08-16 (×3): qty 1

## 2013-08-16 MED ORDER — COLCHICINE 0.6 MG PO TABS
0.6000 mg | ORAL_TABLET | Freq: Every day | ORAL | Status: DC
  Administered 2013-08-17 – 2013-08-18 (×2): 0.6 mg via ORAL
  Filled 2013-08-16 (×2): qty 1

## 2013-08-16 NOTE — Discharge Instructions (Signed)
We have determined that your problem requires further evaluation in the emergency department.  We will take care of your transport there.  Once at the emergency department, you will be evaluated by a provider and they will order whatever treatment or tests they deem necessary.  We cannot guarantee that they will do any specific test or do any specific treatment.  ° °

## 2013-08-16 NOTE — ED Notes (Signed)
Per [facility], pt began feeling bad around 3 days ago having SOB and Upper abd pain. Pt went to urgent care where he received an EKG which showed eleveation in Lead I and Lead II and AVL with no reciprocal changes. Pt alert x 4 at this time. Pt denies CP at this time. Pt denies SOB. Pt received 1 nitro in route.

## 2013-08-16 NOTE — ED Notes (Signed)
Patient transported to X-ray 

## 2013-08-16 NOTE — ED Notes (Signed)
Attempted report x1. 

## 2013-08-16 NOTE — ED Notes (Signed)
Attempted IV in R AC without success. [facility] EMT-Paramedic

## 2013-08-16 NOTE — ED Notes (Signed)
[facility] here- report given to RN. 

## 2013-08-16 NOTE — ED Provider Notes (Signed)
CSN: [id number]     Arrival date & time 08/16/13  2016 History   First MD Initiated Contact with Patient 08/16/13 2024     Chief Complaint  Patient presents with  . Code STEMI     (Consider location/radiation/quality/duration/timing/severity/associated sxs/prior Treatment) HPI Pt presenting to the ED as transfer from urgent care for code stemi. He had onset of left sided chest pain today. Sharp and worse with deep breathing. No fever/chills.  However approx 1 week ago he had sore throat with some cough and subjective fever.  EKG at urgent care showed st elevations in inferior leads on EKG.  Pt denies current chest pain on arrival to the ED.  Has hx of sarcoidosis and DVT. He is currently on coumadin.  Denies shortness of breath.  Pt received nitro via EMS which did help to resolve his pain.  There are no other associated systemic symptoms, there are no other alleviating or modifying factors.   Past Medical History  Diagnosis Date  . DVT (deep venous thrombosis)     a. mainly affect R leg  . Sarcoidosis     a. eye involvement only  . Asthma    Past Surgical History  Procedure Laterality Date  . Glaucoma surgery    . Cataract extraction    . Eye surgery Bilateral 2015    scraped calcium from cornea   History reviewed. No pertinent family history. History  Substance Use Topics  . Smoking status: Never Smoker   . Smokeless tobacco: Not on file  . Alcohol Use: Yes     Comment: occasional    Review of Systems ROS reviewed and all otherwise negative except for mentioned in HPI    Allergies  Review of patient's allergies indicates no known allergies.  Home Medications   Prior to Admission medications   Medication Sig Start Date End Date Taking? Authorizing Provider  brimonidine (ALPHAGAN) 0.2 % ophthalmic solution Place 1 drop into both eyes 3 (three) times daily.   Yes Historical Provider, MD  calcium-vitamin D (OSCAL WITH D) 500-200 MG-UNIT per tablet Take 1 tablet by  mouth daily with breakfast.   Yes Historical Provider, MD  gabapentin (NEURONTIN) 600 MG tablet Take 1,200 mg by mouth at bedtime.     Yes Historical Provider, MD  homatropine 2 % ophthalmic solution Place 2 drops into both eyes 2 (two) times daily.     Yes Historical Provider, MD  ketorolac (ACULAR) 0.5 % ophthalmic solution Place 1 drop into both eyes 2 (two) times daily.   Yes Historical Provider, MD  MECLIZINE HCL PO Take 1 tablet by mouth daily.   Yes Historical Provider, MD  mycophenolate (CELLCEPT) 500 MG tablet Take 1,000 mg by mouth 2 (two) times daily.   Yes Historical Provider, MD  naproxen sodium (ANAPROX) 220 MG tablet Take 220 mg by mouth 2 (two) times daily as needed. For pain    Yes Historical Provider, MD  prednisoLONE acetate (PRED FORTE) 1 % ophthalmic suspension Place 1 drop into both eyes 3 (three) times daily.    Yes Historical Provider, MD  traMADol (ULTRAM) 50 MG tablet Take 50 mg by mouth every 6 (six) hours as needed. Maximum dose= 8 tablets per day For pain   Yes Historical Provider, MD  valACYclovir (VALTREX) 1000 MG tablet Take 1,000 mg by mouth daily.   Yes Historical Provider, MD  warfarin (COUMADIN) 5 MG tablet Take 2.5-5 mg by mouth daily. Take 2.5mg  by mouth on Mon, take 5mg  by mouth  every other day   Yes Historical Provider, MD   BP 138/80  Pulse 90  Temp(Src) 98.3 F (36.8 C) (Oral)  Resp 18  Ht 5\' 11"  (1.803 m)  Wt 256 lb 6.4 oz (116.302 kg)  BMI 35.78 kg/m2  SpO2 99% Vitals reviewed Physical Exam Physical Examination: General appearance - alert, well appearing, and in no distress Mental status - alert, oriented to person, place, and time Eyes - no conjunctival injection, no scleral icterus Chest - clear to auscultation, no wheezes, rales or rhonchi, symmetric air entry Heart - normal rate, regular rhythm, normal S1, S2, no murmurs, rubs, clicks or gallops Abdomen - soft, nontender, nondistended, no masses or organomegaly Extremities - peripheral  pulses normal, no pedal edema, no clubbing or cyanosis Skin - normal coloration and turgor, no rashes  ED Course  Procedures (including critical care time)  8:24 PM pt seen and evaluated, code stemi was activated by [facility].  Cardiology is at the bedside.  Pt is no longer having chest pain, EKG done at urgent care shows inferior ST elevations  8:38 PM Dr. Adolm Joseph, cardiology states pt is no longer a stemi- they will admit patient, feel it is more likely a pericarditis.  Awaiting INR, then will given 800mg  ibuprofen.  Pt continues to be chest pain free.  Labs Review Labs Reviewed  APTT - Abnormal; Notable for the following:    aPTT 64 (*)    All other components within normal limits  CBC - Abnormal; Notable for the following:    RBC 3.67 (*)    Hemoglobin 11.9 (*)    HCT 35.0 (*)    All other components within normal limits  COMPREHENSIVE METABOLIC PANEL - Abnormal; Notable for the following:    Sodium 136 (*)    Creatinine, Ser 1.45 (*)    Albumin 3.1 (*)    AST 48 (*)    Alkaline Phosphatase 124 (*)    GFR calc non Af Amer 56 (*)    GFR calc Af Amer 65 (*)    All other components within normal limits  PROTIME-INR - Abnormal; Notable for the following:    Prothrombin Time 25.4 (*)    INR 2.40 (*)    All other components within normal limits  SEDIMENTATION RATE - Abnormal; Notable for the following:    Sed Rate 57 (*)    All other components within normal limits  C-REACTIVE PROTEIN - Abnormal; Notable for the following:    CRP 18.9 (*)    All other components within normal limits  CBC - Abnormal; Notable for the following:    RBC 3.77 (*)    Hemoglobin 12.4 (*)    HCT 36.5 (*)    All other components within normal limits  BASIC METABOLIC PANEL - Abnormal; Notable for the following:    Glucose, Bld 112 (*)    Creatinine, Ser 1.47 (*)    GFR calc non Af Amer 55 (*)    GFR calc Af Amer 64 (*)    All other components within normal limits  PROTIME-INR - Abnormal; Notable  for the following:    Prothrombin Time 22.6 (*)    INR 2.06 (*)    All other components within normal limits  TROPONIN I  TROPONIN I  TROPONIN I  PROTIME-INR  BASIC METABOLIC PANEL  HEPATIC FUNCTION PANEL  I-STAT TROPOININ, ED    Imaging Review Dg Chest 2 View  08/16/2013   CLINICAL DATA:  Chest pain ; reported history of sarcoidosis.  EXAM: CHEST  2 VIEW  COMPARISON:  November 10, 2008  FINDINGS: There is interstitial prominence with mild persistent hilar and azygos region fullness. These changes are stable since the prior study. There is no frank consolidation. The heart is upper normal in size with pulmonary vascularity normal. No bone lesions. No pneumothorax.  IMPRESSION: Changes as noted above, consistent with a history of sarcoidosis. No airspace consolidation. No new lymph node prominence.   Electronically Signed   By: Bretta Bang M.D.   On: 08/16/2013 21:56     EKG Interpretation None      Date: 08/16/2013 Rate- 98  Rhythm: normal sinus rhythm  QRS Axis: normal  Intervals: normal  ST/T Wave abnormalities: st elevations in inferior leads  Conduction Disutrbances: none    EKG read incomplete as original taken by cardiology for evaluation, and EKG not coming up for me to view epic for interpretation in muse     MDM   Final diagnoses:  Chest pain  Pericarditis  Sarcoid    Pt presenting with c/o chest pain, he was transferred to the ED from urgent care as a stemi activation.  He was seen by cardiology and code stemi cancelled, felt to be more likely pericarditis.  Pt not having chest pain currently on ED evaluation.  Pt given aspirin.  Heparin held as he is on coumadin, INR therapeutic.  Pt admitted to cardiology service.      Ethelda Chick, MD 08/17/13 [phone]

## 2013-08-16 NOTE — ED Notes (Signed)
[facility] called with Code Stemi.

## 2013-08-16 NOTE — ED Notes (Signed)
Called to registration to assess pt.  C/o getting hot and cold since Thur.- did not check his temperature at home.  C/o pain under his ribs when he breathes ( on inspiration).  C/o non-productive cough and SOB when he climbs steps.  Has some wheezing  at night when he lies down.  No acute resp. Distress. VS stable.

## 2013-08-16 NOTE — ED Provider Notes (Signed)
Chief Complaint   Chief Complaint  Patient presents with  . Chest Pain    History of Present Illness    Richard Shepherd is a 48 year old male with sarcoidosis who has had a one-week history of intermittent, left submammary chest pain without radiation. Current episode began this afternoon. He has not had any exertional chest pain. He's felt hot and cold and had sweats. He also feels somewhat short of breath. The pain is described as pleuritic. He also described a sore throat, earache, slight cough. 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 He denies any cardiac history. No history of high blood pressure, high cholesterol, diabetes, or cigarette smoking. No family history of heart disease. He denies nausea. No abdominal pain.  Review of Systems    Other than noted above, the patient denies any of the following symptoms. Systemic:  No fever or chills. Pulmonary:  No cough, wheezing, shortness of breath, sputum production, hemoptysis. Cardiac:  No palpitations, rapid heartbeat, dizziness, presyncope or syncope. GI:  No abdominal pain, heartburn, nausea, or vomiting. Ext:  No leg pain or swelling.  PMFSH    Past medical history, family history, social history, meds, and allergies were reviewed. Current meds include Neurontin, CellCept, tramadol, and Coumadin. He has a history of DVT, sarcoidosis, and asthma.  Physical Exam     Vital signs:  BP 137/73  Pulse 95  Temp(Src) 99.3 F (37.4 C) (Oral)  Resp 24  SpO2 99% Gen:  Alert, oriented, in no distress, skin warm and dry. Eye:  PERRL, lids and conjunctivas normal.  Sclera non-icteric. ENT:  Mucous membranes moist, pharynx clear. Neck:  Supple, no adenopathy or tenderness.  No JVD. Lungs:  Clear to auscultation, no wheezes, rales or rhonchi.  No respiratory distress. Heart:  Regular rhythm.  No gallops, murmers, clicks or rubs. Chest:  There is mild tenderness to palpation in the left submammary  area. Abdomen:  Soft, nontender, no organomegaly or mass.  Bowel sounds normal.  No pulsatile abdominal mass or bruit. Ext:  No edema.  No calf tenderness and Homann's sign negative.  Pulses full and equal. Skin:  Warm and dry.  No rash.   EKG Results:  Date: 08/16/2013  Rate: 92   Rhythm: normal sinus rhythm  QRS Axis: normal  Intervals: normal  ST/T Wave abnormalities: ST elevations laterally  Conduction Disutrbances:none  Narrative Interpretation: Sinus rhythm, left ventricular hypertrophy, inferior infarct, possibly acute, lateral injury pattern consistent with acute STEMI.  Old EKG Reviewed: none available    Course in Urgent Care Center         He was begun on oxygen, monitor, and IV normal saline and given nitroglycerin. He was not given aspirin since he is on Coumadin.  Assessment     The encounter diagnosis was STEMI (ST elevation myocardial infarction).  Plan     The patient was transferred to the ED via [facility] in stable condition.  Medical Decision Making:  48 year old male with sarcoidosis has had a one-week history of intermittent left submammary chest pain without radiation. The pain is not exertional. It is pruritic. It's been associated with feeling hot and cold and some sweats. He's also short of breath. No nausea. No history of cardiac disease. He's had a history of DVT and is on warfarin for that. We do not know his recent INR. An EKG shows ST segment elevation in leads I, II, aVL. Dr. Gery Pray has been called and the code STEMI has been activated.     Reuben Likes, MD 08/16/13 2015

## 2013-08-16 NOTE — H&P (Signed)
History and Physical  Patient ID: Richard Shepherd MRN: [id number], SOB: 02/07/1966 48 y.o. Date of Encounter: 08/16/2013, 8:54 PM  Primary Physician: [facility] [place] Primary Cardiologist: None  Chief Complaint: chest pain  HPI: 48 y.o. male w/ PMHx significant for h/o DVT 2006, on coumadin, sarcoidosis (mainly eye involvement), h/o shingles who presented to urgent care with 1 week history of feeling poorly. Transferred to [facility] on 08/16/2013 as EKG concerning for STEMI.   He denies any cardiac history though did undergo stress testing several years ago which was ultimately determined to be shingles. No cardiac involvement of his sarcoidosis.   He reports 1 week ago, felt fevers and chills and had sore throat. Was soaking through tshirts but never checked to see if he had a fever. Felt okay but symptoms continued over the past several days progressing to dyspnea on exertion and pleuritic chest pain. Sharp pain on left side, worse with inspiration. Currently not present unless he takes very deep breath. Sought urgent care due to symptoms and EKG concerning for lateral ST elevations --> STEMI called.  Currently no chest except with very deep inspiration. No fevers, chills, nausea, vomiting. No LE edema.   STEMI called off as no chest pain and history and EKG consistent with pericardial process.   EKG revealed NSR ST elevations in I, II, AVL (though R wave progression appears to be wrong?, repeat EKG without AVL involvement). CXR pending Labs pending. Initial troponin negative.   Past Medical History  Diagnosis Date  . DVT (deep vein thrombosis) in pregnancy   . Sarcoidosis   . Asthma      Surgical History:  Past Surgical History  Procedure Laterality Date  . Glaucoma surgery    . Cataract extraction    . Eye surgery Bilateral 2015    scraped calcium from cornea     Home Meds: Prior to Admission medications   Medication Sig Start Date End Date Taking?  Authorizing Provider  brimonidine (ALPHAGAN) 0.2 % ophthalmic solution Place 1 drop into both eyes 3 (three) times daily.   Yes Historical Provider, MD  calcium-vitamin D (OSCAL WITH D) 500-200 MG-UNIT per tablet Take 1 tablet by mouth daily with breakfast.   Yes Historical Provider, MD  gabapentin (NEURONTIN) 600 MG tablet Take 1,200 mg by mouth at bedtime.     Yes Historical Provider, MD  homatropine 2 % ophthalmic solution Place 2 drops into both eyes 2 (two) times daily.     Yes Historical Provider, MD  ketorolac (ACULAR) 0.5 % ophthalmic solution Place 1 drop into both eyes 2 (two) times daily.   Yes Historical Provider, MD  MECLIZINE HCL PO Take 1 tablet by mouth daily.   Yes Historical Provider, MD  mycophenolate (CELLCEPT) 500 MG tablet Take 1,000 mg by mouth 2 (two) times daily.   Yes Historical Provider, MD  naproxen sodium (ANAPROX) 220 MG tablet Take 220 mg by mouth 2 (two) times daily as needed. For pain    Yes Historical Provider, MD  prednisoLONE acetate (PRED FORTE) 1 % ophthalmic suspension Place 1 drop into both eyes 3 (three) times daily.    Yes Historical Provider, MD  traMADol (ULTRAM) 50 MG tablet Take 50 mg by mouth every 6 (six) hours as needed. Maximum dose= 8 tablets per day For pain   Yes Historical Provider, MD  valACYclovir (VALTREX) 1000 MG tablet Take 1,000 mg by mouth daily.   Yes Historical Provider, MD  warfarin (COUMADIN) 5 MG tablet Take 2.5-5  mg by mouth daily. Take 2.5mg  by mouth on Mon, take 5mg  by mouth every other day   Yes Historical Provider, MD    Allergies: No Known Allergies  History   Social History  . Marital Status: Married    Spouse Name: N/A    Number of Children: N/A  . Years of Education: N/A   Occupational History  . Not on file.   Social History Main Topics  . Smoking status: Never Smoker   . Smokeless tobacco: Not on file  . Alcohol Use: Yes     Comment: occasional  . Drug Use: No  . Sexual Activity: Not on file   Other  Topics Concern  . Not on file   Social History Narrative  . No narrative on file     No family history on file.  Review of Systems General: see HPI Cardiovascular: see HPI Dermatological: negative for rash Respiratory: negative for cough or wheezing Urologic: negative for hematuria Abdominal: negative for nausea, vomiting, diarrhea, bright red blood per rectum, melena, or hematemesis Neurologic: negative for visual changes, syncope, or dizziness All other systems reviewed and are otherwise negative except as noted above.  Labs:   Lab Results  Component Value Date   WBC 9.1 08/16/2013   HGB 11.9* 08/16/2013   HCT 35.0* 08/16/2013   MCV 95.4 08/16/2013   PLT 294 08/16/2013   No results found for this basename: NA, K, CL, CO2, BUN, CREATININE, CALCIUM, LABALBU, PROT, BILITOT, ALKPHOS, ALT, AST, GLUCOSE,  in the last 168 hours No results found for this basename: CKTOTAL, CKMB, TROPONINI,  in the last 72 hours No results found for this basename: CHOL, HDL, LDLCALC, TRIG   No results found for this basename: DDIMER    Radiology/Studies:  No results found.   EKG: repeat with PR depression in II, V5, V6,  STE in III, avf, V4-6 without reciprocal changes.  Physical Exam: Blood pressure 141/74, pulse 101, temperature 99.9 F (37.7 C), temperature source Oral, resp. rate 26, SpO2 97.00%. General: Well developed, well nourished, in no acute distress. Head: Normocephalic, atraumatic, sclera non-icteric, nares are without discharge Neck: Supple. Negative for carotid bruits. JVD not elevated. Lungs: Clear bilaterally to auscultation without wheezes, rales, or rhonchi. Breathing is unlabored. Heart: RRR with S1 S2. No murmurs, rubs, or gallops appreciated. Abdomen: Soft, non-tender, non-distended with normoactive bowel sounds. No rebound/guarding. No obvious abdominal masses. Msk:  Strength and tone appear normal for age. Extremities: No edema. No clubbing or cyanosis. Distal pedal  pulses are 2+ and equal bilaterally. Neuro: Alert and oriented X 3. Moves all extremities spontaneously. Psych:  Responds to questions appropriately with a normal affect.    ASSESSMENT AND PLAN:  Problem List 1. Chest pain, likely pericarditis 2. Sarcoidosis, eye involvment, neuropathy 3. H/o DVT, on coumadin 4. Asthma 5. H/o shingles, on suppressive therapy  48 y.o. male w/ PMHx significant for h/o DVT 2006, on coumadin, sarcoidosis (mainly eye involvement), h/o shingles who presented to urgent care with 1 week history of feeling poorly. [facility]16[facility]2013 as EKG concerning for STEMI but after evaluation, symptoms and history are more consistent with pericarditis.  Recent URI symptoms, pleuritic pain and ST elevations and PR depressions without true reciprocal changes more suggestive of pericardial process. Initial troponin negative arguing against myocardial involvement. Chest pain free currently. H/o most consistent with viral etiology but sarcoid could be a cause as well. Check inflammatory markers. Cxray doesn't demonstrate enlarged silhouette, check echo for  effusion.  Treat with ibuprofen and colchicine. PPI due NSAIDs and warfarin.  Consider consulting pulm if questions arise concerning use of anti-inflammatories, colchicine and his immunosuppression. Continue meds for sarcoid.  Continue coumadin for h/o DVT.   Prophylaxis: PPI Coumadin  Signed, Naquan Garman C. MD 08/16/2013, 8:54 PM

## 2013-08-17 ENCOUNTER — Encounter (HOSPITAL_COMMUNITY): Payer: Self-pay | Admitting: Physician Assistant

## 2013-08-17 DIAGNOSIS — I319 Disease of pericardium, unspecified: Principal | ICD-10-CM

## 2013-08-17 DIAGNOSIS — D869 Sarcoidosis, unspecified: Secondary | ICD-10-CM

## 2013-08-17 DIAGNOSIS — R079 Chest pain, unspecified: Secondary | ICD-10-CM

## 2013-08-17 LAB — BASIC METABOLIC PANEL
BUN: 17 mg/dL (ref 6–23)
CALCIUM: 9.2 mg/dL (ref 8.4–10.5)
CHLORIDE: 100 meq/L (ref 96–112)
CO2: 26 meq/L (ref 19–32)
CREATININE: 1.47 mg/dL — AB (ref 0.50–1.35)
GFR calc Af Amer: 64 mL/min — ABNORMAL LOW (ref >90)
GFR calc non Af Amer: 55 mL/min — ABNORMAL LOW (ref >90)
GLUCOSE: 112 mg/dL — AB (ref 70–99)
Potassium: 4.3 mEq/L (ref 3.7–5.3)
Sodium: 140 mEq/L (ref 137–147)

## 2013-08-17 LAB — PROTIME-INR
INR: 2.06 — ABNORMAL HIGH (ref 0.00–1.49)
Prothrombin Time: 22.6 seconds — ABNORMAL HIGH (ref 11.6–15.2)

## 2013-08-17 LAB — TROPONIN I
Troponin I: <0.3 ng/mL (ref <0.30)
Troponin I: <0.3 ng/mL (ref <0.30)
Troponin I: <0.3 ng/mL (ref <0.30)

## 2013-08-17 LAB — CBC
HEMATOCRIT: 36.5 % — AB (ref 39.0–52.0)
HEMOGLOBIN: 12.4 g/dL — AB (ref 13.0–17.0)
MCH: 32.9 pg (ref 26.0–34.0)
MCHC: 34 g/dL (ref 30.0–36.0)
MCV: 96.8 fL (ref 78.0–100.0)
Platelets: 315 10*3/uL (ref 150–400)
RBC: 3.77 MIL/uL — AB (ref 4.22–5.81)
RDW: 13.1 % (ref 11.5–15.5)
WBC: 7.8 10*3/uL (ref 4.0–10.5)

## 2013-08-17 LAB — C-REACTIVE PROTEIN: CRP: 18.9 mg/dL — AB (ref <0.60)

## 2013-08-17 MED ORDER — WARFARIN - PHARMACIST DOSING INPATIENT
Freq: Every day | Status: DC
  Administered 2013-08-17: 18:00:00

## 2013-08-17 MED ORDER — MYCOPHENOLATE MOFETIL 250 MG PO CAPS
1000.0000 mg | ORAL_CAPSULE | Freq: Two times a day (BID) | ORAL | Status: DC
  Administered 2013-08-17 – 2013-08-18 (×3): 1000 mg via ORAL
  Filled 2013-08-17 (×4): qty 4

## 2013-08-17 MED ORDER — WARFARIN SODIUM 5 MG PO TABS
5.0000 mg | ORAL_TABLET | Freq: Once | ORAL | Status: AC
  Administered 2013-08-17: 5 mg via ORAL
  Filled 2013-08-17: qty 1

## 2013-08-17 NOTE — Progress Notes (Signed)
ANTICOAGULATION CONSULT NOTE - Follow Up  Pharmacy Consult for Warfarin  Indication: DVT, h/o   No Known Allergies  Patient Measurements: Height: 5\' 11"  (180.3 cm) Weight: 256 lb 6.4 oz (116.302 kg) IBW/kg (Calculated) : 75.3  Vital Signs: Temp: 97.6 F (36.4 C) (06/11 0533) Temp src: Oral (06/11 0533) BP: 129/73 mmHg (06/11 0533) Pulse Rate: 75 (06/11 0533)  Labs:  Recent Labs  08/16/13 2022 08/16/13 2359 08/17/13 0447  HGB 11.9*  --  12.4*  HCT 35.0*  --  36.5*  PLT 294  --  315  APTT 64*  --   --   LABPROT 25.4*  --  22.6*  INR 2.40*  --  2.06*  CREATININE 1.45*  --  1.47*  TROPONINI  --  <0.30 <0.30    Estimated Creatinine Clearance: 80.6 ml/min (by C-G formula based on Cr of 1.47).   Medical History: Past Medical History  Diagnosis Date  . DVT (deep venous thrombosis)     a. mainly affect R leg  . Sarcoidosis     a. eye involvement only  . Asthma    Assessment: 48 y/o M admitted 08/16/2013 With CP and ST changes. Concern for pericarditis.  Pharmacy consulted to continue warfarin  for h/o DVT.  PMH: DVT 2006, sarcoid, asthma  Coag: h/o DVT . INR 2.4 on admit, remains at goal, no bleeding noted, CBC stable PTA: 2.5 mg Monday, 5 mg all other days  ID history of shingles Valtrex 1g daily  CV: pericardidts Colchicine, ibuprofen  Opthamology: sarcoid:  Alphagan, acular, predforte, cellcept 1g bid  PTA Medication Issues: Home medications not ordered:  Homatropine 2@ 2 ggts both eyes bid, meclizine daily, gapapentin  Best Practices: DVT Px: INR at goal, IV protonix  Goal of Therapy:  INR 2-3 Monitor platelets by anticoagulation protocol: Yes   Plan:  -Warfarin 5mg  PO x 1 at 1800 -Daily PT/INR -Monitor for bleeding  Thank you for allowing pharmacy to be a part of this patients care team.  Lovenia Kim Pharm.D., BCPS, AQ-Cardiology Clinical Pharmacist 08/17/2013 10:45 AM Pager: [phone] Phone: [phone]

## 2013-08-17 NOTE — Progress Notes (Signed)
Utilization Review Completed.Richard Shepherd T6/01/2014  

## 2013-08-17 NOTE — Progress Notes (Signed)
  Echocardiogram 2D Echocardiogram has been performed.  Georgian Co 08/17/2013, 9:05 AM

## 2013-08-17 NOTE — Progress Notes (Signed)
Patient Name: Richard Shepherd Date of Encounter: 08/17/2013     Principal Problem:   Pericarditis Active Problems:   Chest pain   Sarcoidosis   Chronic kidney disease   LEG EDEMA    SUBJECTIVE  Denies any significant SOB or chest discomfort. He does have mild chest pain on deep inspiration. Otherwise, his symptom has improved. Felt feverish and chills last Thur   CURRENT MEDS . brimonidine  1 drop Both Eyes TID  . calcium-vitamin D  1 tablet Oral Q breakfast  . colchicine  0.6 mg Oral Daily  . gabapentin  1,200 mg Oral QHS  . homatropine  2 drop Both Eyes BID  . ibuprofen  600 mg Oral TID  . ketorolac  1 drop Both Eyes BID  . mycophenolate  1,000 mg Oral BID  . pantoprazole (PROTONIX) IV  40 mg Intravenous QHS  . prednisoLONE acetate  1 drop Both Eyes TID  . sodium chloride  3 mL Intravenous Q12H  . valACYclovir  1,000 mg Oral Daily  . warfarin  5 mg Oral ONCE-1800  . Warfarin - Pharmacist Dosing Inpatient   Does not apply q1800    OBJECTIVE  Filed Vitals:   08/16/13 2058 08/16/13 2129 08/16/13 2238 08/17/13 0533  BP: 137/73 136/91 116/46 129/73  Pulse: 95 90 82 75  Temp:   99.7 F (37.6 C) 97.6 F (36.4 C)  TempSrc:   Oral Oral  Resp: 22 22 20 18   Height:   5\' 11"  (1.803 m)   Weight:   256 lb 6.4 oz (116.302 kg)   SpO2: 100% 98% 97% 96%   No intake or output data in the 24 hours ending 08/17/13 0947 Filed Weights   08/16/13 2238  Weight: 256 lb 6.4 oz (116.302 kg)    PHYSICAL EXAM  General: Pleasant, NAD. Neuro: Alert and oriented X 3. Moves all extremities spontaneously. Psych: Normal affect. HEENT:  Normal  Neck: Supple without bruits or JVD. Lungs:  Resp regular and unlabored, CTA. Heart: RRR no s3, s4, or murmurs. No obvious pericardial rub Abdomen: Soft, non-tender, non-distended, BS + x 4.  Extremities: No clubbing, cyanosis or edema. DP/PT/Radials 2+ and equal bilaterally.  Accessory Clinical Findings  CBC  Recent Labs   08/16/13 2022 08/17/13 0447  WBC 9.1 7.8  HGB 11.9* 12.4*  HCT 35.0* 36.5*  MCV 95.4 96.8  PLT 294 315   Basic Metabolic Panel  Recent Labs  08/16/13 2022 08/17/13 0447  NA 136* 140  K 4.0 4.3  CL 97 100  CO2 25 26  GLUCOSE 89 112*  BUN 14 17  CREATININE 1.45* 1.47*  CALCIUM 8.4 9.2   Liver Function Tests  Recent Labs  08/16/13 2022  AST 48*  ALT 46  ALKPHOS 124*  BILITOT 0.5  PROT 7.0  ALBUMIN 3.1*   Cardiac Enzymes  Recent Labs  08/16/13 2359 08/17/13 0447  TROPONINI <0.30 <0.30    TELE  NSR with HR 70s, no significant ventricular ectopy  ECG  NSR with HR 98, diffuse ST elevation in all leads except aVL, aVR, V1-V2  Radiology/Studies  Dg Chest 2 View  08/16/2013   CLINICAL DATA:  Chest pain ; reported history of sarcoidosis.  EXAM: CHEST  2 VIEW  COMPARISON:  November 10, 2008  FINDINGS: There is interstitial prominence with mild persistent hilar and azygos region fullness. These changes are stable since the prior study. There is no frank consolidation. The heart is upper normal in size with pulmonary vascularity  normal. No bone lesions. No pneumothorax.  IMPRESSION: Changes as noted above, consistent with a history of sarcoidosis. No airspace consolidation. No new lymph node prominence.   Electronically Signed   By: Bretta Bang M.D.   On: 08/16/2013 21:56    ASSESSMENT AND PLAN  1. Pericarditis 2/2 viral infection vs sarcoidosis  - continue colchicine and ibuprofen. Monitor kidney function while on ibuprofen, short term use of ibuprofen only  - unclear if pericarditis related to recent viral infection vs sarcoidosis  - pending echo today to r/o restrictive disease and assess if has pericardial fluid  - if no obvious restrictive disease or pericardial effusion on echo, likely does not need MRI given patient symptomatic improvement  - continue on PPI for GI protection  2. CKD, stage II  3. H/o DVT on coumadin, INR 2.0  - no recent  bleeding, no h/o peptic ulcer, gastritis or GI bleed  Richard Shepherd Course PA Pager: [phone]  Patient seen and examined. Agree with assessment and plan. Feels beter, although still with pleuritic chest pain with inspiration and recumbency. Echo done; results pending. F/U renal fxn and LFT's.   Richard Bihari, MD, [facility] 08/17/2013 11:11 AM

## 2013-08-17 NOTE — Progress Notes (Signed)
ANTICOAGULATION CONSULT NOTE - Initial Consult  Pharmacy Consult for Warfarin  Indication: DVT, h/o   No Known Allergies  Patient Measurements: Height: 5\' 11"  (180.3 cm) Weight: 256 lb 6.4 oz (116.302 kg) IBW/kg (Calculated) : 75.3  Vital Signs: Temp: 99.7 F (37.6 C) (06/10 2238) Temp src: Oral (06/10 2238) BP: 116/46 mmHg (06/10 2238) Pulse Rate: 82 (06/10 2238)  Labs:  Recent Labs  08/16/13 2022 08/16/13 2359  HGB 11.9*  --   HCT 35.0*  --   PLT 294  --   APTT 64*  --   LABPROT 25.4*  --   INR 2.40*  --   CREATININE 1.45*  --   TROPONINI  --  <0.30    Estimated Creatinine Clearance: 81.7 ml/min (by C-G formula based on Cr of 1.45).   Medical History: Past Medical History  Diagnosis Date  . DVT (deep vein thrombosis) in pregnancy   . Sarcoidosis   . Asthma    Assessment: 48 y/o M on warfarin PTA for h/o DVT (last dose 6/10). INR 2.4 on admit, Hgb 11.9, other labs as above.   Goal of Therapy:  INR 2-3 Monitor platelets by anticoagulation protocol: Yes   Plan:  -Warfarin 5mg  PO x 1 at 1800 -Daily PT/INR -Monitor for bleeding  Abran Duke 08/17/2013,12:58 AM

## 2013-08-18 ENCOUNTER — Other Ambulatory Visit: Payer: Self-pay

## 2013-08-18 ENCOUNTER — Other Ambulatory Visit: Payer: Self-pay | Admitting: Physician Assistant

## 2013-08-18 DIAGNOSIS — I309 Acute pericarditis, unspecified: Secondary | ICD-10-CM

## 2013-08-18 LAB — HEPATIC FUNCTION PANEL
ALK PHOS: 139 U/L — AB (ref 39–117)
ALT: 80 U/L — ABNORMAL HIGH (ref 0–53)
AST: 82 U/L — AB (ref 0–37)
Albumin: 3.1 g/dL — ABNORMAL LOW (ref 3.5–5.2)
BILIRUBIN TOTAL: 0.5 mg/dL (ref 0.3–1.2)
Bilirubin, Direct: <0.2 mg/dL (ref 0.0–0.3)
TOTAL PROTEIN: 7.3 g/dL (ref 6.0–8.3)

## 2013-08-18 LAB — BASIC METABOLIC PANEL
BUN: 16 mg/dL (ref 6–23)
CHLORIDE: 105 meq/L (ref 96–112)
CO2: 25 meq/L (ref 19–32)
Calcium: 9.1 mg/dL (ref 8.4–10.5)
Creatinine, Ser: 1.39 mg/dL — ABNORMAL HIGH (ref 0.50–1.35)
GFR calc Af Amer: 68 mL/min — ABNORMAL LOW (ref >90)
GFR calc non Af Amer: 59 mL/min — ABNORMAL LOW (ref >90)
GLUCOSE: 115 mg/dL — AB (ref 70–99)
POTASSIUM: 4.5 meq/L (ref 3.7–5.3)
SODIUM: 143 meq/L (ref 137–147)

## 2013-08-18 LAB — PROTIME-INR
INR: 2.29 — AB (ref 0.00–1.49)
Prothrombin Time: 24.5 seconds — ABNORMAL HIGH (ref 11.6–15.2)

## 2013-08-18 MED ORDER — WARFARIN SODIUM 5 MG PO TABS
5.0000 mg | ORAL_TABLET | Freq: Once | ORAL | Status: DC
  Filled 2013-08-18: qty 1

## 2013-08-18 MED ORDER — IBUPROFEN 600 MG PO TABS: 600.0000 mg | ORAL_TABLET | Freq: Three times a day (TID) | ORAL | Status: AC

## 2013-08-18 MED ORDER — DOCUSATE SODIUM 100 MG PO CAPS
100.0000 mg | ORAL_CAPSULE | Freq: Every day | ORAL | Status: DC | PRN
  Administered 2013-08-18: 100 mg via ORAL
  Filled 2013-08-18: qty 1

## 2013-08-18 MED ORDER — PANTOPRAZOLE SODIUM 40 MG PO TBEC: 40.0000 mg | DELAYED_RELEASE_TABLET | Freq: Every day | ORAL | Status: DC

## 2013-08-18 MED ORDER — METOPROLOL TARTRATE 25 MG PO TABS: 12.5000 mg | ORAL_TABLET | Freq: Two times a day (BID) | ORAL | Status: DC

## 2013-08-18 MED ORDER — COLCHICINE 0.6 MG PO TABS: 0.6000 mg | ORAL_TABLET | Freq: Every day | ORAL | Status: DC

## 2013-08-18 MED ORDER — OMEPRAZOLE 20 MG PO CPDR: 20.0000 mg | DELAYED_RELEASE_CAPSULE | Freq: Every day | ORAL | Status: DC

## 2013-08-18 NOTE — Progress Notes (Signed)
Patient Name: Richard Shepherd Date of Encounter: 08/18/2013     Principal Problem:   Pericarditis Active Problems:   Chest pain   Sarcoidosis   Chronic kidney disease   LEG EDEMA    SUBJECTIVE  Mild chest discomfort this morning with deep inhalation. No significant chest pain at rest. He states he feels the medication is helping  CURRENT MEDS . brimonidine  1 drop Both Eyes TID  . calcium-vitamin D  1 tablet Oral Q breakfast  . colchicine  0.6 mg Oral Daily  . gabapentin  1,200 mg Oral QHS  . homatropine  2 drop Both Eyes BID  . ibuprofen  600 mg Oral TID  . ketorolac  1 drop Both Eyes BID  . mycophenolate  1,000 mg Oral BID  . pantoprazole (PROTONIX) IV  40 mg Intravenous QHS  . prednisoLONE acetate  1 drop Both Eyes TID  . sodium chloride  3 mL Intravenous Q12H  . valACYclovir  1,000 mg Oral Daily  . Warfarin - Pharmacist Dosing Inpatient   Does not apply q1800    OBJECTIVE  Filed Vitals:   08/17/13 0533 08/17/13 1440 08/17/13 2038 08/18/13 0608  BP: 129/73 134/79 138/80 138/73  Pulse: 75 83 90 84  Temp: 97.6 F (36.4 C) 97.8 F (36.6 C) 98.3 F (36.8 C) 98.5 F (36.9 C)  TempSrc: Oral Oral Oral Oral  Resp: 18 16 18 18   Height:      Weight:    256 lb 11.2 oz (116.438 kg)  SpO2: 96% 96% 99% 98%   No intake or output data in the 24 hours ending 08/18/13 0800 Filed Weights   08/16/13 2238 08/18/13 0608  Weight: 256 lb 6.4 oz (116.302 kg) 256 lb 11.2 oz (116.438 kg)    PHYSICAL EXAM  General: Pleasant, NAD. Neuro: Alert and oriented X 3. Moves all extremities spontaneously. Psych: Normal affect. HEENT:  Normal  Neck: Supple without bruits or JVD. Lungs:  Resp regular and unlabored, CTA. Heart: RRR no s3, s4, or murmurs. No significant pericardial rub Abdomen: Soft, non-tender, non-distended, BS + x 4.  Extremities: No clubbing, cyanosis or edema. DP/PT/Radials 2+ and equal bilaterally.  Accessory Clinical Findings  CBC  Recent Labs   08/16/13 2022 08/17/13 0447  WBC 9.1 7.8  HGB 11.9* 12.4*  HCT 35.0* 36.5*  MCV 95.4 96.8  PLT 294 315   Basic Metabolic Panel  Recent Labs  08/17/13 0447 08/18/13 0332  NA 140 143  K 4.3 4.5  CL 100 105  CO2 26 25  GLUCOSE 112* 115*  BUN 17 16  CREATININE 1.47* 1.39*  CALCIUM 9.2 9.1   Liver Function Tests  Recent Labs  08/16/13 2022 08/18/13 0332  AST 48* 82*  ALT 46 80*  ALKPHOS 124* 139*  BILITOT 0.5 0.5  PROT 7.0 7.3  ALBUMIN 3.1* 3.1*   Cardiac Enzymes  Recent Labs  08/16/13 2359 08/17/13 0447 08/17/13 1047  TROPONINI <0.30 <0.30 <0.30    TELE  NSR with HR 80s, occasional PVCs, no significant ventricular ectopy  ECG  EKG 08/16/2013 sinus rythm with HR 98, diffuse ST elevation.   Radiology/Studies  Dg Chest 2 View  08/16/2013   CLINICAL DATA:  Chest pain ; reported history of sarcoidosis.  EXAM: CHEST  2 VIEW  COMPARISON:  November 10, 2008  FINDINGS: There is interstitial prominence with mild persistent hilar and azygos region fullness. These changes are stable since the prior study. There is no frank consolidation. The heart is  upper normal in size with pulmonary vascularity normal. No bone lesions. No pneumothorax.  IMPRESSION: Changes as noted above, consistent with a history of sarcoidosis. No airspace consolidation. No new lymph node prominence.   Electronically Signed   By: Bretta BangWilliam  Woodruff M.D.   On: 08/16/2013 21:56    ASSESSMENT AND PLAN  1. Pericarditis 2/2 viral infection vs sarcoidosis  - continue colchicine and ibuprofen. Monitor kidney function while on ibuprofen, short term use of ibuprofen only  - unclear if pericarditis related to recent viral infection vs sarcoidosis  - pending echo today to r/o restrictive disease and assess if has pericardial fluid  - if no obvious restrictive disease or pericardial effusion on echo, likely does not need MRI given patient symptomatic improvement  - continue on PPI after discharge for GI  protection  - repeat EKG to see if ST changes improved - possible discharge today if no gross abnormality on echo  2. CKD, stage II   - Cr improving  3. H/o DVT on coumadin, INR 2.29  - no recent bleeding, no h/o peptic ulcer, gastritis or GI bleed   4. h/o sarcoidosis with eye involvement only   Ramond DialSigned, Meng, Hao PA Pager: [phone]  Echo Study Conclusions  - Left ventricle: The cavity size was normal. Systolic function was normal. The estimated ejection fraction was in the range of 60% to 65%. Wall motion was normal; there were no regional wall motion abnormalities. - Mitral valve: There was mild regurgitation. - Left atrium: The atrium was mildly dilated. - Pulmonic valve: There was mild regurgitation. - Pericardium, extracardiac: A small pericardial effusion was identified circumferential to the heart. The fluid had no internal echoes.There was no evidence of hemodynamic compromise. There was mild right atrial chamber collapse for less than 50% of the cardiac cycle.   Patient seen and examined. Agree with assessment and plan. ECG with evolving inferolateral ST changes consistent with his pericarditis. 2 component friction rub today on exam. OK for dc today, but would re-check echo in 1-2 weeks with ov f/u. Will add low dose beta blocker.   Lennette Biharihomas A. Kelly, MD, [facility] 08/18/2013 10:37 AM

## 2013-08-18 NOTE — Discharge Summary (Signed)
Discharge Summary   Patient ID: Richard Shepherd,  MRN: [id number], DOB/AGE: 48-Mar-1967 48 y.o.  Admit date: 08/16/2013 Discharge date: 08/18/2013  Primary Care Provider: No PCP Per Patient Primary Cardi[facility] [place]  Discharge Diagnoses Principal Problem:   Pericarditis Active Problems:   Chest pain   Sarcoidosis   Chronic kidney disease   LEG EDEMA   Allergies No Known Allergies  Procedures  Transthoracic Echocardiography  Patient: Richard Shepherd, [place] MR #: [id number] Study Date: 08/17/2013 Gender: M Age: 48 Height: 180.3 cm Weight: 116.1 kg BSA: 2.45 m^2 Pt. Status: Room: 3W35C  ADMITTING Nanetta BattyJonathan Berry, MD ATTENDING Nanetta BattyJonathan Berry, MD SONOGRAPHER Georgian CoLauren Williams, RDCS, CCT Arnoldo LenisDERING Whitlock, Matthew PERFORMING [facility], Inpatient  cc:  ------------------------------------------------------------------- LV EF: 60% - 65%  ------------------------------------------------------------------- Indications: Chest pain 786.51.  ------------------------------------------------------------------- History: Risk factors: DVT. Pericarditis. Sarcoidosis. Leg edema.  ------------------------------------------------------------------- Study Conclusions  - Left ventricle: The cavity size was normal. Systolic function was normal. The estimated ejection fraction was in the range of 60% to 65%. Wall motion was normal; there were no regional wall motion abnormalities. - Mitral valve: There was mild regurgitation. - Left atrium: The atrium was mildly dilated. - Pulmonic valve: There was mild regurgitation. - Pericardium, extracardiac: A small pericardial effusion was identified circumferential to the heart. The fluid had no internal echoes.There was no evidence of hemodynamic compromise. There was mildright atrial chamber collapse for less than 50% of the cardiac cycle.    Hospital Course  The patient is a 48 year old African American male with  past medical history significant for DVT in 2006, on chronic Coumadin, history of sarcoidosis with eye involvement and a history of shingles who was transferred to [facility] on 08/16/2013 for STEMI after presented to the urgent care with sharp pain on the left side and shortness of breath. EKG revealed diffuse ST elevation in all leads. His symptom was pleuritic in nature and worse with deep inspiration, however no chest pain otherwise. His EKG finding and symptoms were more consistent with pericarditis, therefore STEMI was canceled.   Patient was placed on ibuprofen 600 mg 3 times a day and colchicine 0.6 mg daily with symptomatic improvement. Given the fact patient has been taking Coumadin for history of DVT, he is at higher risk for GI bleed when taking ibuprofen, therefore PPI was added to his medical regimen as well. Echocardiogram was obtained on 08/17/2013 which revealed EF of 60-65%, no wall motion abnormality, mild mitral regurgitation and small circumferential pericardial effusion noted in the pericardium without sign of hemodynamic compromise. During the admission, his heart rate has been consistently in the high 80s and low 90s, however her rate goes up to low 100s with minimal exertion. Metoprolol was added to his medical regimen for rate control.  Patient was seen the morning of 08/18/2013, at which time, he still have some mild chest discomfort with deep inspiration however no significant chest pain overnight. He denies any significant shortness of breath either. He is felt stable for discharge from cardiology perspective. [facility] [place]. He will have a repeat echocardiogram on 08/28/2013 to [facility] after which he will have a one time followup with Dr. Tresa EndoKelly in the clinic on 08/30/2013. If his pericardial effusion appears to be stable on repeat echo, patient [facility] after that. Patient has been advised to  contact us if he noticed any significant GI bleed, blood in the stool, blood in the  urine, increasing shortness of breath or dizziness.   Discharge Vitals Blood pressure 138/73, pulse 84, temperature 98.5 F (36.9 C), temperature source Oral, resp. rate 18, height 5\' 11"  (1.803 m), weight 256 lb 11.2 oz (116.438 kg), SpO2 98.00%.  Filed Weights   08/16/13 2238 08/18/13 0608  Weight: 256 lb 6.4 oz (116.302 kg) 256 lb 11.2 oz (116.438 kg)    Labs  CBC  Recent Labs  08/16/13 2022 08/17/13 0447  WBC 9.1 7.8  HGB 11.9* 12.4*  HCT 35.0* 36.5*  MCV 95.4 96.8  PLT 294 315   Basic Metabolic Panel  Recent Labs  08/17/13 0447 08/18/13 0332  NA 140 143  K 4.3 4.5  CL 100 105  CO2 26 25  GLUCOSE 112* 115*  BUN 17 16  CREATININE 1.47* 1.39*  CALCIUM 9.2 9.1   Liver Function Tests  Recent Labs  08/16/13 2022 08/18/13 0332  AST 48* 82*  ALT 46 80*  ALKPHOS 124* 139*  BILITOT 0.5 0.5  PROT 7.0 7.3  ALBUMIN 3.1* 3.1*   No results found for this basename: LIPASE, AMYLASE,  in the last 72 hours Cardiac Enzymes  Recent Labs  08/16/13 2359 08/17/13 0447 08/17/13 1047  TROPONINI <0.30 <0.30 <0.30    Disposition  Pt is being discharged home today in good condition.  Follow-up Plans & Appointments      Follow-up Information   Follow up with [facility] On 08/28/2013. (outpatient Echo at 10:00am at [facility] Echo lab. Can get direction from [facility] hospital front desk or call [phone])       Follow up with Lennette Bihari, MD On 08/30/2013. (10:30 am for 1 time follow up. After which time, he will follow up with [facility])    Specialty:  Cardiology   Contact information:   [address] [place] [address] [phone]       Discharge Medications    Medication List    STOP taking these medications       naproxen sodium 220 MG tablet  Commonly known as:  ANAPROX      TAKE these medications       brimonidine 0.2 % ophthalmic  solution  Commonly known as:  ALPHAGAN  Place 1 drop into both eyes 3 (three) times daily.     calcium-vitamin D 500-200 MG-UNIT per tablet  Commonly known as:  OSCAL WITH D  Take 1 tablet by mouth daily with breakfast.     colchicine 0.6 MG tablet  Take 1 tablet (0.6 mg total) by mouth daily.     gabapentin 600 MG tablet  Commonly known as:  NEURONTIN  Take 1,200 mg by mouth at bedtime.     homatropine 2 % ophthalmic solution  Place 2 drops into both eyes 2 (two) times daily.     ibuprofen 600 MG tablet  Commonly known as:  ADVIL,MOTRIN  Take 1 tablet (600 mg total) by mouth 3 (three) times daily. Can substitute with over the counter medication with same strength (600mg )     ketorolac 0.5 % ophthalmic solution  Commonly known as:  ACULAR  Place 1 drop into both eyes 2 (two) times daily.     MECLIZINE HCL PO  Take 1 tablet by mouth daily.     metoprolol tartrate 25 MG tablet  Commonly known as:  LOPRESSOR  Take 0.5 tablets (12.5 mg total) by mouth 2 (two) times daily.     mycophenolate 500 MG tablet  Commonly known  as:  CELLCEPT  Take 1,000 mg by mouth 2 (two) times daily.     omeprazole 20 MG capsule  Commonly known as:  PRILOSEC  Take 1 capsule (20 mg total) by mouth daily.     prednisoLONE acetate 1 % ophthalmic suspension  Commonly known as:  PRED FORTE  Place 1 drop into both eyes 3 (three) times daily.     traMADol 50 MG tablet  Commonly known as:  ULTRAM  - Take 50 mg by mouth every 6 (six) hours as needed. Maximum dose= 8 tablets per day  - For pain     valACYclovir 1000 MG tablet  Commonly known as:  VALTREX  Take 1,000 mg by mouth daily.     warfarin 5 MG tablet  Commonly known as:  COUMADIN  Take 2.5-5 mg by mouth daily. Take 2.5mg  by mouth on Mon, take 5mg  by mouth every other day        Outstanding Labs/Studies  Outpatient echo on 08/28/2013 at 10:00am  Duration of Discharge Encounter   Greater than 30 minutes including physician  time.  Ramond Dial PA 08/18/2013, 11:57 AM Pager: [phone]

## 2013-08-18 NOTE — Progress Notes (Signed)
ANTICOAGULATION CONSULT NOTE - Follow Up Consult  Pharmacy Consult for Warfarin Indication: Hx DVT  No Known Allergies  Patient Measurements: Height: 5\' 11"  (180.3 cm) Weight: 256 lb 11.2 oz (116.438 kg) IBW/kg (Calculated) : 75.3  Vital Signs: Temp: 98.5 F (36.9 C) (06/12 0608) Temp src: Oral (06/12 0608) BP: 138/73 mmHg (06/12 0608) Pulse Rate: 84 (06/12 0608)  Labs:  Recent Labs  08/16/13 2022 08/16/13 2359 08/17/13 0447 08/17/13 1047 08/18/13 0332  HGB 11.9*  --  12.4*  --   --   HCT 35.0*  --  36.5*  --   --   PLT 294  --  315  --   --   APTT 64*  --   --   --   --   LABPROT 25.4*  --  22.6*  --  24.5*  INR 2.40*  --  2.06*  --  2.29*  CREATININE 1.45*  --  1.47*  --  1.39*  TROPONINI  --  <0.30 <0.30 <0.30  --     Estimated Creatinine Clearance: 85.2 ml/min (by C-G formula based on Cr of 1.39).   Assessment: 58 YOM who continues on warfarin for hx DVT with a therapeutic INR this morning (INR 2.29 << 2.06, goal of 2-3). No CBC today - no bleeding noted.   Will also change protonix IV to po today per P&T criteria listed below:  These criteria include:  The patient is eating (either orally or via tube) and/or has been taking other orally administered medications for a least 24 hours  There is no active GI bleed or impaired GI absorption noted.     Goal of Therapy:  INR 2-3 Monitor platelets by anticoagulation protocol: Yes   Plan:  1. Warfarin 5 mg x 1 dose at 1800 today 2. Will continue to monitor for any signs/symptoms of bleeding and will follow up with PT/INR in the a.m.   Georgina Pillion, PharmD, BCPS Clinical Pharmacist Pager: [phone] 08/18/2013 11:49 AM

## 2013-08-18 NOTE — Progress Notes (Signed)
Reviewed discharge instructions with patient and wife, they stated their understanding.  Instructions given to patient to notify MD for increasing shortness of breath, pain or dizziness.  Discharged home via wheelchair.  Richard Shepherd

## 2013-08-18 NOTE — Discharge Instructions (Addendum)
Pericardial Effusion Pericardial effusion is an accumulation of extra fluid in the pericardial space. This is the space between the heart and the sac that surrounds it. This space normally contains a small amount of fluid which serves as lubrication for the heart inside the sac. If the amount of fluid increases, it causes problems for the working of the heart. This is called a heart (cardiac) tamponade.  CAUSES  A higher incidence of pericardial effusion is associated with certain diseases. Some common causes of pericardial effusion are:  Infections (bacterial, viral, fungal, from AIDS, etc.).  Cancer which has spread to the pericardial sac.  Fluid accumulation in the sac after a heart attack or open heart surgery.  Injury (a fall with chest injury or as a result of a knife or gunshot wound) with bleeding into the pericardial sac.  Immune diseases (such as Rheumatoid Arthritis) and other arthritis conditions.  Reactions (uncommon) to medications. SYMPTOMS  Very small effusions may cause no problems. Even a small effusion if it comes on rapidly can be deadly. Some common symptoms are:  Chest pain, pressure, discomfort.  Light-headed feeling, fainting.  Cough, shortness of breath.  Feeling of palpitations.  Hiccoughs  Anxiety or confusion DIAGNOSIS  Your caregiver may do a number of blood tests and imaging tests (like X-rays) to help find the cause.   ECHO (Echocardiographic stress test) has become the diagnostic method of choice. This is due to its portability and availability. CT and MRI are also used, and may be more accurate.  Pericardioscopy, where available, uses a telescope-like instrument to look inside the pericardial sac.  This may be helpful in cases of unexplained pericardial effusions. It allows your caregiver to look at the pericardium and do pericardial biopsies if something abnormal is found.  Sometimes fluid may be removed to help with diagnosing the cause of  the accumulation. This is called a pericardiocentesis. TREATMENT  Treatment is directed at removal of the extra pericardial fluid and treating the cause. Small amounts of effusion that are not causing problems can simply be watched. If the fluid is accumulating rapidly and resulting in cardiac tamponade, this can be life-threatening. Emergency removal of fluid must be done.  SEEK IMMEDIATE MEDICAL CARE IF:   You develop chest pain, irregular heart beat (palpitations) or racing heart, shortness of breath, or begin sweating.  You become lightheaded or pass out.  You develop increasing swelling of the legs. Document Released: 10/21/2004 Document Revised: 05/18/2011 Document Reviewed: 10/07/2007 [facility] Patient Information 2014 [place], [place].

## 2013-08-28 ENCOUNTER — Ambulatory Visit (HOSPITAL_COMMUNITY)
Admission: RE | Admit: 2013-08-28 | Discharge: 2013-08-28 | Disposition: A | Discharge status: Home / Self Care | Payer: Medicare Other | Source: Ambulatory Visit | Attending: Internal Medicine | Admitting: Internal Medicine

## 2013-08-28 DIAGNOSIS — I319 Disease of pericardium, unspecified: Secondary | ICD-10-CM

## 2013-08-28 DIAGNOSIS — I309 Acute pericarditis, unspecified: Secondary | ICD-10-CM

## 2013-08-28 NOTE — Progress Notes (Signed)
2D Echocardiogram Complete.  08/28/2013   Richard Shepherd, RDCS  Preliminary Technician Findings:  Findings seem to be consistent with report from 08-17-13.  There was a Small-Moderate sized circumfrential Pericardial Effusion, causing mild right atrial and right ventricular dimpling without signs of collapse or tamponade.  Richard Shepherd appears stable and is having no shortness of breath, or other symptoms at this time.

## 2013-08-30 ENCOUNTER — Ambulatory Visit (INDEPENDENT_AMBULATORY_CARE_PROVIDER_SITE_OTHER): Payer: Medicare Other | Admitting: Cardiovascular Disease

## 2013-08-30 ENCOUNTER — Encounter: Payer: Self-pay | Admitting: Cardiovascular Disease

## 2013-08-30 VITALS — BP 134/72 | HR 96 | Ht 71.0 in | Wt 262.3 lb

## 2013-08-30 DIAGNOSIS — R609 Edema, unspecified: Secondary | ICD-10-CM

## 2013-08-30 DIAGNOSIS — N182 Chronic kidney disease, stage 2 (mild): Secondary | ICD-10-CM

## 2013-08-30 DIAGNOSIS — R0602 Shortness of breath: Secondary | ICD-10-CM

## 2013-08-30 DIAGNOSIS — I219 Acute myocardial infarction, unspecified: Secondary | ICD-10-CM

## 2013-08-30 DIAGNOSIS — I82409 Acute embolism and thrombosis of unspecified deep veins of unspecified lower extremity: Secondary | ICD-10-CM

## 2013-08-30 DIAGNOSIS — R5383 Other fatigue: Secondary | ICD-10-CM

## 2013-08-30 DIAGNOSIS — R0789 Other chest pain: Secondary | ICD-10-CM

## 2013-08-30 DIAGNOSIS — R6889 Other general symptoms and signs: Secondary | ICD-10-CM

## 2013-08-30 DIAGNOSIS — R5381 Other malaise: Secondary | ICD-10-CM

## 2013-08-30 DIAGNOSIS — D869 Sarcoidosis, unspecified: Secondary | ICD-10-CM

## 2013-08-30 DIAGNOSIS — Z79899 Other long term (current) drug therapy: Secondary | ICD-10-CM

## 2013-08-30 DIAGNOSIS — I319 Disease of pericardium, unspecified: Secondary | ICD-10-CM

## 2013-08-30 MED ORDER — FUROSEMIDE 20 MG PO TABS: 20.0000 mg | ORAL_TABLET | Freq: Every day | ORAL | Status: DC

## 2013-08-30 NOTE — Patient Instructions (Signed)
Dr Tresa Endo has recommended making the following medication changes:  START Furosemide (Lasix) 20 mg - take 1 tablet daily  Your physician has ordered you to have some blood work done. (CMET, Sed Rate, A&A, CBC)  Your physician has requested that you have an echocardiogram. Echocardiography is a painless test that uses sound waves to create images of your heart. It provides your doctor with information about the size and shape of your heart and how well your heart's chambers and valves are working. This procedure takes approximately one hour. There are no restrictions for this procedure.  Your physician recommends that you schedule a follow-up appointment in 4 weeks with Dr Tresa Endo.

## 2013-09-01 ENCOUNTER — Encounter: Payer: Self-pay | Admitting: Cardiovascular Disease

## 2013-09-01 NOTE — Progress Notes (Signed)
Patient ID: Richard Shepherd, male   DOB: [birth year], 48 y.o.   MRN: [id number]     HPI: Richard Shepherd is a 48 y.o. male who presents to the office today for a follow up cardiology evaluation following his recent hospitalization with pericarditis  [name] is a 48 year old African American gentleman, who has a history of sarcoidosis with eye involvement, as well as a history of shingles, who was admitted to [facility] on 08/16/2013  With sharp chest pain on his left side.  His ECG showed diffuse ST elevation with was felt to be due to pericarditis rather than ST segment elevation myocardial infarction.  He was placed on ibuprofen 600 mg 3 times a day.  In addition to colchicine 0.6 mg daily with symptomatic improvement.  The patient has been on chronic Coumadin therapy for history of DVT and because of potential high risk for GI bleeding with ibuprofen, PPI therapy was instituted.  An echo Doppler study on 08/17/2013 showed an ejection fraction of 60-65% without wall motion abnormality, mild mitral regurgitation, and a small circumferential pericardial effusion without signs of hemodynamic compromise.  His heart rate often would trend from 90-200 and metoprolol was added to his medical regimen.  He was discharged.  He underwent a subsequent echo Doppler study on 08/28/2013.  This showed ejection fraction at 60-65%.  There was mild mitral regurgitation.  His left atrium was mildly dilated.  There was still evidence for small to moderate free flowing pericardial effusion circumferentially without internal echoes and again, there was no evidence for hemodynamic compromise or chamber collapse.  A normal risk viral phasic change in stroke volume.  He presents for followup evaluation.  Since his hospital discharge, he has noticed some intermittent episodes of hot and cold spells without frank fever.  He denies any cough.  He denies myalgias.  He still notes some mild pleuritic chest pain symptoms. He does  note some mild leg swelling.   Past Medical History  Diagnosis Date  . DVT (deep venous thrombosis)     a. mainly affect R leg  . Sarcoidosis     a. eye involvement only  . Asthma     Past Surgical History  Procedure Laterality Date  . Glaucoma surgery  2012  . Cataract extraction  2012  . Eye surgery Bilateral 2015    scraped calcium from cornea    No Known Allergies  Current Outpatient Prescriptions  Medication Sig Dispense Refill  . brimonidine (ALPHAGAN) 0.2 % ophthalmic solution Place 1 drop into both eyes 3 (three) times daily.      . calcium-vitamin D (OSCAL WITH D) 500-200 MG-UNIT per tablet Take 1 tablet by mouth daily with breakfast.      . colchicine 0.6 MG tablet Take 1 tablet (0.6 mg total) by mouth daily.  30 tablet  3  . gabapentin (NEURONTIN) 600 MG tablet Take 1,200 mg by mouth at bedtime.        . homatropine 2 % ophthalmic solution Place 2 drops into both eyes 2 (two) times daily.        Marland Kitchen. ibuprofen (ADVIL,MOTRIN) 600 MG tablet Take 1 tablet (600 mg total) by mouth 3 (three) times daily. Can substitute with over the counter medication with same strength (600mg )  42 tablet  0  . ketorolac (ACULAR) 0.5 % ophthalmic solution Place 1 drop into both eyes 2 (two) times daily.      . MECLIZINE HCL PO Take 1 tablet by mouth daily.      .Marland Kitchen  metoprolol tartrate (LOPRESSOR) 25 MG tablet Take 0.5 tablets (12.5 mg total) by mouth 2 (two) times daily.  30 tablet  3  . mycophenolate (CELLCEPT) 500 MG tablet Take 1,000 mg by mouth 2 (two) times daily.      Marland Kitchen omeprazole (PRILOSEC) 20 MG capsule Take 1 capsule (20 mg total) by mouth daily.  30 capsule  0  . prednisoLONE acetate (PRED FORTE) 1 % ophthalmic suspension Place 1 drop into both eyes 3 (three) times daily.       . traMADol (ULTRAM) 50 MG tablet Take 50 mg by mouth every 6 (six) hours as needed. Maximum dose= 8 tablets per day For pain      . valACYclovir (VALTREX) 1000 MG tablet Take 1,000 mg by mouth daily.      Marland Kitchen  warfarin (COUMADIN) 5 MG tablet Take 2.5-5 mg by mouth daily. Take 2.5mg  by mouth on Mon, take 5mg  by mouth every other day      . furosemide (LASIX) 20 MG tablet Take 1 tablet (20 mg total) by mouth daily.  90 tablet  3   No current facility-administered medications for this visit.    History   Social History  . Marital Status: Married    Spouse Name: N/A    Number of Children: N/A  . Years of Education: N/A   Occupational History  . Not on file.   Social History Main Topics  . Smoking status: Never Smoker   . Smokeless tobacco: Not on file  . Alcohol Use: Yes     Comment: occasional  . Drug Use: No  . Sexual Activity: Not on file   Other Topics Concern  . Not on file   Social History Narrative  . No narrative on file   Socially, he is married.  He served in [employer] for 15 years.  Family History  Problem Relation Age of Onset  . Breast cancer Mother   . Multiple sclerosis Father   . Multiple sclerosis Brother   . Diabetes Brother     ROS General: Negative; No fevers, chills, or night sweats.  No change in weight HEENT: Negative; No changes in vision or hearing, sinus congestion, difficulty swallowing Pulmonary: Negative; No cough, wheezing, shortness of breath, hemoptysis Cardiovascular: See HPI:  GI: Negative; No nausea, vomiting, diarrhea, or abdominal pain GU: Negative; No dysuria, hematuria, or difficulty voiding Musculoskeletal: Negative; no myalgias, joint pain, or weakness Hematologic: History of prior DVT on Coumadin anticoagulation; no easy bruising, bleeding Endocrine: Negative; no heat/cold intolerance; no diabetes, Neuro: Negative; no changes in balance, headaches Skin: Negative; No rashes or skin lesions Psychiatric: Negative; No behavioral problems, depression Sleep: Negative; No snoring,  daytime sleepiness, hypersomnolence, bruxism, restless legs, hypnogognic hallucinations. Other comprehensive 14 point system review is negative   Physical  Exam BP 134/72  Pulse 96  Ht 5\' 11"  (1.803 m)  Wt 262 lb 4.8 oz (118.978 kg)  BMI 36.60 kg/m2 General: Alert, oriented, no distress.  Skin: normal turgor, no rashes, warm and dry HEENT: Normocephalic, atraumatic. Pupils equal round and reactive to light; sclera anicteric; extraocular muscles intact, No lid lag; Nose without nasal septal hypertrophy; Mouth/Parynx benign; Mallinpatti scale 3 Neck: No JVD, no carotid bruits; normal carotid upstroke Lungs: clear to ausculatation and percussion bilaterally; no wheezing or rales, normal inspiratory and expiratory effort Chest wall: without tenderness to palpitation Heart: PMI not displaced, RRR, s1 s2 normal, 3 component friction rub; 1/6 systolic murmur, No diastolic murmur, no rubs, gallops, thrills, or  heaves Abdomen: soft, nontender; no hepatosplenomehaly, BS+; abdominal aorta nontender and not dilated by palpation. Back: no CVA tenderness Pulses: 2+  Musculoskeletal: full range of motion, normal strength, no joint deformities Extremities: Pulses 2+,  mild leg edema;no clubbing cyanosis, Homan's sign negative  Neurologic: grossly nonfocal; Cranial nerves grossly wnl Psychologic: Normal mood and affect   ECG (independently read by me): Normal sinus rhythm.  Nondiagnostic Q waves inferiorly.  Persistent mild J-point elevation inferolaterally with evolved Q waves inferiorly  LABS:  BMET    Component Value Date/Time   NA 143 08/18/2013 0332   K 4.5 08/18/2013 0332   CL 105 08/18/2013 0332   CO2 25 08/18/2013 0332   GLUCOSE 115* 08/18/2013 0332   BUN 16 08/18/2013 0332   CREATININE 1.39* 08/18/2013 0332   CALCIUM 9.1 08/18/2013 0332   GFRNONAA 59* 08/18/2013 0332   GFRAA 68* 08/18/2013 0332     Hepatic Function Panel     Component Value Date/Time   PROT 7.3 08/18/2013 0332   ALBUMIN 3.1* 08/18/2013 0332   AST 82* 08/18/2013 0332   ALT 80* 08/18/2013 0332   ALKPHOS 139* 08/18/2013 0332   BILITOT 0.5 08/18/2013 0332   BILIDIR <0.2 08/18/2013  0332   IBILI NOT CALCULATED 08/18/2013 0332     CBC    Component Value Date/Time   WBC 7.8 08/17/2013 0447   RBC 3.77* 08/17/2013 0447   HGB 12.4* 08/17/2013 0447   HCT 36.5* 08/17/2013 0447   PLT 315 08/17/2013 0447   MCV 96.8 08/17/2013 0447   MCH 32.9 08/17/2013 0447   MCHC 34.0 08/17/2013 0447   RDW 13.1 08/17/2013 0447   LYMPHSABS 1.7 03/13/2010 1801   MONOABS 0.5 03/13/2010 1801   EOSABS 0.4 03/13/2010 1801   BASOSABS 0.0 03/13/2010 1801     BNP    Component Value Date/Time   PROBNP <30.0 11/10/2008 0225    Lipid Panel  No results found for this basename: chol, trig, hdl, cholhdl, vldl, ldlcalc     RADIOLOGY: Dg Chest 2 View  08/16/2013   CLINICAL DATA:  Chest pain ; reported history of sarcoidosis.  EXAM: CHEST  2 VIEW  COMPARISON:  November 10, 2008  FINDINGS: There is interstitial prominence with mild persistent hilar and azygos region fullness. These changes are stable since the prior study. There is no frank consolidation. The heart is upper normal in size with pulmonary vascularity normal. No bone lesions. No pneumothorax.  IMPRESSION: Changes as noted above, consistent with a history of sarcoidosis. No airspace consolidation. No new lymph node prominence.   Electronically Signed   By: Bretta Bang M.D.   On: 08/16/2013 21:56      ASSESSMENT AND PLAN: Mr. Davide Risdon is a 48 year old, African American male, who has a history of sarcoidosis with eye involvement.  He recently presented with her pericarditis and initial acute ST changes .  Due to his pericarditis.  He has been on ibuprofen in addition to colchicine.  His most recent echo Doppler study continues to show a circumferential pericardial effusion.  He continues to have normal systolic and diastolic function.  There is no evidence for chamber collapse or hemodynamic compromise.  Electing to add furosemide 20 mg to his medical regimen, which will also help some of his mild leg edema and shortness of breath.  He will  continue to take nonsteroidal anti-inflammatory therapy, and colchicine.  I am recommending a followup echo Doppler study be obtained in 4 weeks.  I am scheduling him  for laboratory consisting of a compressive metabolic panel, CBC, erythrocyte sedimentation rate, as well as ANA level.  I will see him back in the office in 4 weeks in follow up of his echo and further recommendations were made at that time.    Lennette Bihari, MD, [facility]  09/01/2013 2:52 PM

## 2013-09-05 LAB — CBC
HEMATOCRIT: 32.3 % — AB (ref 39.0–52.0)
Hemoglobin: 11.3 g/dL — ABNORMAL LOW (ref 13.0–17.0)
MCH: 31.3 pg (ref 26.0–34.0)
MCHC: 35 g/dL (ref 30.0–36.0)
MCV: 89.5 fL (ref 78.0–100.0)
PLATELETS: 509 10*3/uL — AB (ref 150–400)
RBC: 3.61 MIL/uL — ABNORMAL LOW (ref 4.22–5.81)
RDW: 13.9 % (ref 11.5–15.5)
WBC: 6.6 10*3/uL (ref 4.0–10.5)

## 2013-09-05 LAB — SEDIMENTATION RATE: SED RATE: 82 mm/h — AB (ref 0–16)

## 2013-09-06 LAB — COMPREHENSIVE METABOLIC PANEL
ALT: 48 U/L (ref 0–53)
AST: 44 U/L — ABNORMAL HIGH (ref 0–37)
Albumin: 3.2 g/dL — ABNORMAL LOW (ref 3.5–5.2)
Alkaline Phosphatase: 172 U/L — ABNORMAL HIGH (ref 39–117)
BUN: 15 mg/dL (ref 6–23)
CALCIUM: 8.6 mg/dL (ref 8.4–10.5)
CHLORIDE: 103 meq/L (ref 96–112)
CO2: 24 meq/L (ref 19–32)
Creat: 1.08 mg/dL (ref 0.50–1.35)
Glucose, Bld: 92 mg/dL (ref 70–99)
Potassium: 4.5 mEq/L (ref 3.5–5.3)
Sodium: 137 mEq/L (ref 135–145)
Total Bilirubin: 0.7 mg/dL (ref 0.2–1.2)
Total Protein: 6.9 g/dL (ref 6.0–8.3)

## 2013-09-06 LAB — ANA: Anti Nuclear Antibody(ANA): NEGATIVE

## 2013-09-12 ENCOUNTER — Ambulatory Visit (HOSPITAL_COMMUNITY)
Admission: RE | Admit: 2013-09-12 | Discharge: 2013-09-12 | Disposition: A | Discharge status: Home / Self Care | Payer: Medicare Other | Source: Ambulatory Visit | Attending: Internal Medicine | Admitting: Internal Medicine

## 2013-09-12 DIAGNOSIS — I319 Disease of pericardium, unspecified: Secondary | ICD-10-CM

## 2013-09-15 ENCOUNTER — Telehealth: Payer: Self-pay | Admitting: *Deleted

## 2013-09-15 NOTE — Telephone Encounter (Signed)
Lab results called to patient.  Voiced understanding.  States he is not having pleuritic chest pain anymore but still not feeling back to normal.  A bit fatigued.  Appointment 09/25/13 for Echo and office visit.  Symptoms reported back to Dr. Tresa Endo.

## 2013-09-15 NOTE — Telephone Encounter (Signed)
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  Is he feeling any better with reference to his pleuritic CP? ------

## 2013-09-15 NOTE — Telephone Encounter (Signed)
ok 

## 2013-09-25 ENCOUNTER — Encounter: Payer: Self-pay | Admitting: Cardiovascular Disease

## 2013-09-25 ENCOUNTER — Ambulatory Visit (INDEPENDENT_AMBULATORY_CARE_PROVIDER_SITE_OTHER): Payer: Medicare Other | Admitting: Cardiovascular Disease

## 2013-09-25 ENCOUNTER — Ambulatory Visit (HOSPITAL_COMMUNITY)
Admission: RE | Admit: 2013-09-25 | Discharge: 2013-09-25 | Disposition: A | Discharge status: Home / Self Care | Payer: Medicare Other | Source: Ambulatory Visit | Attending: Cardiovascular Disease | Admitting: Cardiovascular Disease

## 2013-09-25 VITALS — BP 122/76 | HR 82 | Ht 71.0 in | Wt 249.7 lb

## 2013-09-25 DIAGNOSIS — D869 Sarcoidosis, unspecified: Secondary | ICD-10-CM

## 2013-09-25 DIAGNOSIS — I3139 Other pericardial effusion (noninflammatory): Secondary | ICD-10-CM

## 2013-09-25 DIAGNOSIS — I82409 Acute embolism and thrombosis of unspecified deep veins of unspecified lower extremity: Secondary | ICD-10-CM

## 2013-09-25 DIAGNOSIS — R609 Edema, unspecified: Secondary | ICD-10-CM

## 2013-09-25 DIAGNOSIS — I313 Pericardial effusion (noninflammatory): Secondary | ICD-10-CM

## 2013-09-25 DIAGNOSIS — I059 Rheumatic mitral valve disease, unspecified: Secondary | ICD-10-CM

## 2013-09-25 DIAGNOSIS — I319 Disease of pericardium, unspecified: Secondary | ICD-10-CM

## 2013-09-25 DIAGNOSIS — I219 Acute myocardial infarction, unspecified: Secondary | ICD-10-CM

## 2013-09-25 NOTE — Progress Notes (Signed)
Limited Echocardiogram complete.  Charlane Westry, RCS 

## 2013-09-25 NOTE — Patient Instructions (Signed)
Follow up as needed with Dr.Kelly

## 2013-09-29 ENCOUNTER — Encounter: Payer: Self-pay | Admitting: Cardiovascular Disease

## 2013-09-29 NOTE — Progress Notes (Signed)
Patient ID: Richard Shepherd, male   DOB: 1965/04/22, 48 y.o.   MRN: [id number]     HPI: Richard Shepherd is a 48 y.o. male who presents to the office today for a follow up cardiology evaluation of his  pericarditis  Richard Shepherd is a 48 year old African American gentleman, who has a history of sarcoidosis with eye involvement, as well as a history of shingles, who was admitted to [facility] on 08/16/2013  With sharp chest pain on his left side.  His ECG showed diffuse ST elevation with was felt to be due to pericarditis rather than ST segment elevation myocardial infarction.  He was placed on ibuprofen 600 mg 3 times a day.  In addition to colchicine 0.6 mg daily with symptomatic improvement.  The patient has been on chronic Coumadin therapy for history of DVT and because of potential high risk for GI bleeding with ibuprofen, PPI therapy was instituted.  An echo Doppler study on 08/17/2013 showed an ejection fraction of 60-65% without wall motion abnormality, mild mitral regurgitation, and a small circumferential pericardial effusion without signs of hemodynamic compromise.  His heart rate often would trend from 90- 100 and metoprolol was added to his medical regimen.  He was discharged.  He underwent a subsequent echo Doppler study on 08/28/2013 showed ejection fraction at 60-65%.  There was mild mitral regurgitation.  His left atrium was mildly dilated.  There was still evidence for small to moderate free flowing pericardial effusion circumferentially without internal echoes and again, there was no evidence for hemodynamic compromise or chamber collapse.  A normal risk viral phasic change in stroke volume.  He presents for followup evaluation.  When I saw him one month ago, he was still complaining of intermittent mild pleuritic chest painand had noticed some intermittent episodes of hot and cold spells without frank fever.  He denied any cough.  He denied myalgias.    He had noticed  some mild leg  swelling.  At that time, I added furosemide 20 mg was medical regimen.  He had been on ibuprofen in addition to colchicine.  I recommended he continue nonsteroidal anti-inflammatory therapy, along with colchicine.  Blood work was obtained, and an erythrocyte sedimentation rate was elevated at 82; he had a negative ANA.   Presently, he feels improved.  His edema, essentially has resolved.  He denies any further pleuritic chest pain.  He did undergo a limited echo Doppler study immediately prior to seeing me in the office.  This showed almost complete resolution of his prior pericardial effusion.  He had moderately systolic prolapse of his mitral valve.  It was felt that his mitral regurgitation was moderate rather than mild.  He denies chest pain.  He denies shortness of breath.  He denies palpitations    Past Medical History  Diagnosis Date  . DVT (deep venous thrombosis)     a. mainly affect R leg  . Sarcoidosis     a. eye involvement only  . Asthma     Past Surgical History  Procedure Laterality Date  . Glaucoma surgery  2012  . Cataract extraction  2012  . Eye surgery Bilateral 2015    scraped calcium from cornea    No Known Allergies  Current Outpatient Prescriptions  Medication Sig Dispense Refill  . brimonidine (ALPHAGAN) 0.2 % ophthalmic solution Place 1 drop into both eyes 3 (three) times daily.      . calcium-vitamin D (OSCAL WITH D) 500-200 MG-UNIT per tablet Take 1 tablet by  mouth daily with breakfast.      . colchicine 0.6 MG tablet Take 1 tablet (0.6 mg total) by mouth daily.  30 tablet  3  . furosemide (LASIX) 20 MG tablet Take 1 tablet (20 mg total) by mouth daily.  90 tablet  3  . gabapentin (NEURONTIN) 600 MG tablet Take 1,200 mg by mouth at bedtime.        . homatropine 2 % ophthalmic solution Place 2 drops into both eyes 2 (two) times daily.        Marland Kitchen. ketorolac (ACULAR) 0.5 % ophthalmic solution Place 1 drop into both eyes 2 (two) times daily.      . MECLIZINE HCL  PO Take 1 tablet by mouth daily.      . metoprolol tartrate (LOPRESSOR) 25 MG tablet Take 0.5 tablets (12.5 mg total) by mouth 2 (two) times daily.  30 tablet  3  . mycophenolate (CELLCEPT) 500 MG tablet Take 1,000 mg by mouth 2 (two) times daily.      Marland Kitchen. omeprazole (PRILOSEC) 20 MG capsule Take 1 capsule (20 mg total) by mouth daily.  30 capsule  0  . prednisoLONE acetate (PRED FORTE) 1 % ophthalmic suspension Place 1 drop into both eyes 3 (three) times daily.       . traMADol (ULTRAM) 50 MG tablet Take 50 mg by mouth every 6 (six) hours as needed. Maximum dose= 8 tablets per day For pain      . valACYclovir (VALTREX) 1000 MG tablet Take 1,000 mg by mouth daily.      Marland Kitchen. warfarin (COUMADIN) 5 MG tablet Take 2.5-5 mg by mouth daily. Take 2.5mg  by mouth on Mon, take 5mg  by mouth every other day       No current facility-administered medications for this visit.    History   Social History  . Marital Status: Married    Spouse Name: N/A    Number of Children: N/A  . Years of Education: N/A   Occupational History  . Not on file.   Social History Main Topics  . Smoking status: Never Smoker   . Smokeless tobacco: Not on file  . Alcohol Use: Yes     Comment: occasional  . Drug Use: No  . Sexual Activity: Not on file   Other Topics Concern  . Not on file   Social History Narrative  . No narrative on file   Socially, he is married.  He served in [employer] for [employer] years.  Family History  Problem Relation Age of Onset  . Breast cancer Mother   . Multiple sclerosis Father   . Multiple sclerosis Brother   . Diabetes Brother     ROS General: Negative; No fevers, chills, or night sweats.  No change in weight HEENT: Negative; No changes in vision or hearing, sinus congestion, difficulty swallowing Pulmonary: Negative; No cough, wheezing, shortness of breath, hemoptysis Cardiovascular: See HPI:  GI: Negative; No nausea, vomiting, diarrhea, or abdominal pain GU: Negative; No dysuria,  hematuria, or difficulty voiding Musculoskeletal: Negative; no myalgias, joint pain, or weakness Hematologic: History of prior DVT on Coumadin anticoagulation; no easy bruising, bleeding Endocrine: Negative; no heat/cold intolerance; no diabetes, Neuro: Negative; no changes in balance, headaches Skin: Negative; No rashes or skin lesions Psychiatric: Negative; No behavioral problems, depression Sleep: Negative; No snoring,  daytime sleepiness, hypersomnolence, bruxism, restless legs, hypnogognic hallucinations. Other comprehensive 14 point system review is negative   Physical Exam BP 122/76  Pulse 82  Ht 5\' 11"  (  1.803 m)  Wt 249 lb 11.2 oz (113.263 kg)  BMI 34.84 kg/m2 General: Alert, oriented, no distress.  Skin: normal turgor, no rashes, warm and dry HEENT: Normocephalic, atraumatic. Pupils equal round and reactive to light; sclera anicteric; extraocular muscles intact, No lid lag; Nose without nasal septal hypertrophy; Mouth/Parynx benign; Mallinpatti scale 3 Neck: No JVD, no carotid bruits; normal carotid upstroke Lungs: clear to ausculatation and percussion bilaterally; no wheezing or rales, normal inspiratory and expiratory effort Chest wall: without tenderness to palpitation Heart: PMI not displaced, RRR, s1 s2 normal, intermittent pericardial friction rub, significantly less from his prior evaluatio; 1/6 systolic murmur, No diastolic murmur, no rubs, gallops, thrills, or heaves Abdomen: soft, nontender; no hepatosplenomehaly, BS+; abdominal aorta nontender and not dilated by palpation. Back: no CVA tenderness Pulses: 2+  Musculoskeletal: full range of motion, normal strength, no joint deformities Extremities: Pulses 2+,  mild leg edema;no clubbing cyanosis, Homan's sign negative  Neurologic: grossly nonfocal; Cranial nerves grossly wnl Psychologic: Normal mood and affect   Prior 08/30/2013 ECG (independently read by me): Normal sinus rhythm.  Nondiagnostic Q waves inferiorly.   Persistent mild J-point elevation inferolaterally with evolved Q waves inferiorly   Today's Echo Study Conclusions  - Left ventricle: The cavity size was normal. Wall thickness was normal. Systolic function was normal. The estimated ejection fraction was in the range of 50% to 55%. Possible mild hypokinesis of the lateral myocardium. - Mitral valve: Moderate, late systolicprolapse, involving the anterior leaflet. There was moderate regurgitation directed eccentrically. - Left atrium: The atrium was mildly dilated.  Impressions:  - LImited study without full Doppler assessment of the mitral valve. There is at least moderate eccentric mitral insufficiency, much worse than described on previous reports. The pericardial effusion is no longer present.     LABS:  BMET    Component Value Date/Time   NA 137 09/05/2013 1516   K 4.5 09/05/2013 1516   CL 103 09/05/2013 1516   CO2 24 09/05/2013 1516   GLUCOSE 92 09/05/2013 1516   BUN 15 09/05/2013 1516   CREATININE 1.08 09/05/2013 1516   CREATININE 1.39* 08/18/2013 0332   CALCIUM 8.6 09/05/2013 1516   GFRNONAA 59* 08/18/2013 0332   GFRAA 68* 08/18/2013 0332     Hepatic Function Panel     Component Value Date/Time   PROT 6.9 09/05/2013 1516   ALBUMIN 3.2* 09/05/2013 1516   AST 44* 09/05/2013 1516   ALT 48 09/05/2013 1516   ALKPHOS 172* 09/05/2013 1516   BILITOT 0.7 09/05/2013 1516   BILIDIR <0.2 08/18/2013 0332   IBILI NOT CALCULATED 08/18/2013 0332     CBC    Component Value Date/Time   WBC 6.6 09/05/2013 1516   RBC 3.61* 09/05/2013 1516   HGB 11.3* 09/05/2013 1516   HCT 32.3* 09/05/2013 1516   PLT 509* 09/05/2013 1516   MCV 89.5 09/05/2013 1516   MCH 31.3 09/05/2013 1516   MCHC 35.0 09/05/2013 1516   RDW 13.9 09/05/2013 1516   LYMPHSABS 1.7 03/13/2010 1801   MONOABS 0.5 03/13/2010 1801   EOSABS 0.4 03/13/2010 1801   BASOSABS 0.0 03/13/2010 1801     BNP    Component Value Date/Time   PROBNP <30.0 11/10/2008 0225    Lipid Panel  No  results found for this basename: chol,  trig,  hdl,  cholhdl,  vldl,  ldlcalc     RADIOLOGY: Dg Chest 2 View  08/16/2013   CLINICAL DATA:  Chest pain ; reported history of sarcoidosis.  EXAM: CHEST  2 VIEW  COMPARISON:  November 10, 2008  FINDINGS: There is interstitial prominence with mild persistent hilar and azygos region fullness. These changes are stable since the prior study. There is no frank consolidation. The heart is upper normal in size with pulmonary vascularity normal. No bone lesions. No pneumothorax.  IMPRESSION: Changes as noted above, consistent with a history of sarcoidosis. No airspace consolidation. No new lymph node prominence.   Electronically Signed   By: Bretta Bang M.D.   On: 08/16/2013 21:56      ASSESSMENT AND PLAN: [name] is a 48 year old, African American male, who has a history of sarcoidosis with eye involvement.  He recently presented with her pericarditis and had initial acute ST changes due to  pericarditis.  He has been on ibuprofen in addition to colchicine.  His echo Doppler study from today shows essentially  complete resolution of his prior circumferential pericardial effusion.  One month ago, his sedimentation rate was still elevated at 82.  He no longer is symptomatic.  His peripheral edema has improved with institution of furosemide and he can now take this on an as-needed basis rather than daily.  He is followed at the [facility].  He tells me he had complete set of blood work done just last week. He continues to be on Coumadin for his history of DVT and denies bleeding.  I reviewed his echo Doppler study in detail.  He does have a mitral regurgitant murmur due to mitral valve prolapse.  He still has mild intermittent residual pericardial friction rub.  He continued with followup at the [facility].  I will be available if additional problems arise or further evaluation is necessary.   Lennette Bihari, MD, [facility]  09/29/2013 9:33 AM

## 2013-10-24 ENCOUNTER — Telehealth: Payer: Self-pay | Admitting: Cardiovascular Disease

## 2013-10-24 NOTE — Telephone Encounter (Signed)
8.18.15 Received completed packet for Disability form from [employer]  Processed and sent to [email or web address]  on 8.18.15 lp

## 2013-10-30 ENCOUNTER — Telehealth: Payer: Self-pay | Admitting: Cardiovascular Disease

## 2013-10-30 NOTE — Telephone Encounter (Signed)
8.24.15 Received Disabiity Claim form back from [employer].  Given to W. Wadell for Dr Tresa Endo to sign and return

## 2013-11-08 ENCOUNTER — Telehealth: Payer: Self-pay | Admitting: Cardiovascular Disease

## 2013-11-08 NOTE — Telephone Encounter (Signed)
Mrs Miura calling concerning her paperwork on her husband's disability claim.  Needs to know when she can expect to receive it back.  Please call.

## 2013-11-08 NOTE — Telephone Encounter (Signed)
9.2.15 Received signed Critical Illness Claim Form from Dr Tresa Endo, Notified Dortha Kern and advised signed and ready to pick up.  She will pick up on 11/09/13 lp

## 2014-05-21 ENCOUNTER — Other Ambulatory Visit: Payer: Self-pay

## 2014-05-21 MED ORDER — METOPROLOL TARTRATE 25 MG PO TABS: 12.5000 mg | ORAL_TABLET | Freq: Two times a day (BID) | ORAL | Status: DC

## 2014-09-03 ENCOUNTER — Other Ambulatory Visit: Payer: Self-pay

## 2014-09-03 MED ORDER — FUROSEMIDE 20 MG PO TABS: 20.0000 mg | ORAL_TABLET | Freq: Every day | ORAL | Status: DC

## 2014-12-21 ENCOUNTER — Other Ambulatory Visit: Payer: Self-pay | Admitting: Cardiovascular Disease

## 2014-12-21 MED ORDER — METOPROLOL TARTRATE 25 MG PO TABS: 12.5000 mg | ORAL_TABLET | Freq: Two times a day (BID) | ORAL | Status: DC

## 2015-03-10 DIAGNOSIS — I319 Disease of pericardium, unspecified: Secondary | ICD-10-CM

## 2015-03-10 HISTORY — DX: Disease of pericardium, unspecified: I31.9

## 2015-07-23 ENCOUNTER — Emergency Department (HOSPITAL_COMMUNITY)
Admission: EM | Admit: 2015-07-23 | Discharge: 2015-07-23 | Disposition: A | Discharge status: Home / Self Care | Payer: Medicare Other | Attending: Emergency Medicine | Admitting: Emergency Medicine

## 2015-07-23 ENCOUNTER — Emergency Department (HOSPITAL_COMMUNITY): Payer: Medicare Other

## 2015-07-23 ENCOUNTER — Encounter (HOSPITAL_COMMUNITY): Payer: Self-pay | Admitting: Emergency Medicine

## 2015-07-23 DIAGNOSIS — J45909 Unspecified asthma, uncomplicated: Secondary | ICD-10-CM | POA: Insufficient documentation

## 2015-07-23 DIAGNOSIS — Z7901 Long term (current) use of anticoagulants: Secondary | ICD-10-CM | POA: Diagnosis not present

## 2015-07-23 DIAGNOSIS — Z86718 Personal history of other venous thrombosis and embolism: Secondary | ICD-10-CM | POA: Insufficient documentation

## 2015-07-23 DIAGNOSIS — N390 Urinary tract infection, site not specified: Secondary | ICD-10-CM | POA: Diagnosis not present

## 2015-07-23 DIAGNOSIS — R319 Hematuria, unspecified: Secondary | ICD-10-CM | POA: Diagnosis present

## 2015-07-23 DIAGNOSIS — Z79899 Other long term (current) drug therapy: Secondary | ICD-10-CM | POA: Insufficient documentation

## 2015-07-23 LAB — URINALYSIS, ROUTINE W REFLEX MICROSCOPIC
Glucose, UA: NEGATIVE mg/dL
Ketones, ur: 40 mg/dL — AB
Nitrite: POSITIVE — AB
Specific Gravity, Urine: 1.019 (ref 1.005–1.030)
pH: 5.5 (ref 5.0–8.0)

## 2015-07-23 LAB — BASIC METABOLIC PANEL
Anion gap: 10 (ref 5–15)
BUN: 15 mg/dL (ref 6–20)
CO2: 23 mmol/L (ref 22–32)
Calcium: 9.6 mg/dL (ref 8.9–10.3)
Chloride: 103 mmol/L (ref 101–111)
Creatinine, Ser: 1.61 mg/dL — ABNORMAL HIGH (ref 0.61–1.24)
GFR calc non Af Amer: 49 mL/min — ABNORMAL LOW (ref >60)
GFR, EST AFRICAN AMERICAN: 56 mL/min — AB (ref >60)
Glucose, Bld: 110 mg/dL — ABNORMAL HIGH (ref 65–99)
Potassium: 4.1 mmol/L (ref 3.5–5.1)
SODIUM: 136 mmol/L (ref 135–145)

## 2015-07-23 LAB — CBC WITH DIFFERENTIAL/PLATELET
BASOS ABS: 0.1 10*3/uL (ref 0.0–0.1)
BASOS PCT: 1 %
Eosinophils Absolute: 0.3 10*3/uL (ref 0.0–0.7)
Eosinophils Relative: 3 %
HCT: 43.7 % (ref 39.0–52.0)
HEMOGLOBIN: 14.9 g/dL (ref 13.0–17.0)
Lymphocytes Relative: 18 %
Lymphs Abs: 1.5 10*3/uL (ref 0.7–4.0)
MCH: 31.6 pg (ref 26.0–34.0)
MCHC: 34.1 g/dL (ref 30.0–36.0)
MCV: 92.8 fL (ref 78.0–100.0)
MONOS PCT: 8 %
Monocytes Absolute: 0.7 10*3/uL (ref 0.1–1.0)
NEUTROS PCT: 70 %
Neutro Abs: 6 10*3/uL (ref 1.7–7.7)
Platelets: 204 10*3/uL (ref 150–400)
RBC: 4.71 MIL/uL (ref 4.22–5.81)
RDW: 13.1 % (ref 11.5–15.5)
WBC: 8.5 10*3/uL (ref 4.0–10.5)

## 2015-07-23 LAB — URINE MICROSCOPIC-ADD ON

## 2015-07-23 LAB — PROTIME-INR
INR: 1.79 — ABNORMAL HIGH (ref 0.00–1.49)
Prothrombin Time: 20.8 seconds — ABNORMAL HIGH (ref 11.6–15.2)

## 2015-07-23 MED ORDER — LEVOFLOXACIN 750 MG PO TABS
750.0000 mg | ORAL_TABLET | Freq: Once | ORAL | Status: AC
  Administered 2015-07-23: 750 mg via ORAL
  Filled 2015-07-23: qty 1

## 2015-07-23 MED ORDER — HYDROMORPHONE HCL 1 MG/ML IJ SOLN
2.0000 mg | Freq: Once | INTRAMUSCULAR | Status: AC
  Administered 2015-07-23: 2 mg via INTRAMUSCULAR
  Filled 2015-07-23: qty 2

## 2015-07-23 MED ORDER — LEVOFLOXACIN 750 MG PO TABS: 750.0000 mg | ORAL_TABLET | Freq: Every day | ORAL | Status: DC

## 2015-07-23 NOTE — ED Provider Notes (Signed)
CSN: [id number]     Arrival date & time 07/23/15  0208 History   First MD Initiated Contact with Patient 07/23/15 0455     Chief Complaint  Patient presents with  . Hematuria  . Fall     (Consider location/radiation/quality/duration/timing/severity/associated sxs/prior Treatment) HPI  Richard Shepherd is a 50 y.o. male with past medical history of sarcoidosis in the eye only, right lower extremity DVT on Coumadin, presenting today with hematuria and dysuria. Patient states it began last night that worsened this morning. He's passed large blood clots. He also fell at home and reports left shoulder pain. Patient does take mycophenolate for sarcoidosis and thus he states he is immunocompromised. He denies history of urinary infection in the past. He's had no fevers. Patient has no further complaints.  10 Systems reviewed and are negative for acute change except as noted in the HPI.      Past Medical History  Diagnosis Date  . DVT (deep venous thrombosis) (HCC)     a. mainly affect R leg  . Sarcoidosis (HCC)     a. eye involvement only  . Asthma    Past Surgical History  Procedure Laterality Date  . Glaucoma surgery  2012  . Cataract extraction  2012  . Eye surgery Bilateral 2015    scraped calcium from cornea   Family History  Problem Relation Age of Onset  . Breast cancer Mother   . Multiple sclerosis Father   . Multiple sclerosis Brother   . Diabetes Brother    Social History  Substance Use Topics  . Smoking status: Never Smoker   . Smokeless tobacco: None  . Alcohol Use: Yes    Review of Systems    Allergies  Review of patient's allergies indicates no known allergies.  Home Medications   Prior to Admission medications   Medication Sig Start Date End Date Taking? Authorizing Provider  brimonidine (ALPHAGAN) 0.2 % ophthalmic solution Place 1 drop into both eyes 3 (three) times daily.    Historical Provider, MD  calcium-vitamin D (OSCAL WITH D) 500-200 MG-UNIT  per tablet Take 1 tablet by mouth daily with breakfast.    Historical Provider, MD  colchicine 0.6 MG tablet Take 1 tablet (0.6 mg total) by mouth daily. 08/18/13   Azalee Course, PA  furosemide (LASIX) 20 MG tablet Take 1 tablet (20 mg total) by mouth daily. 09/03/14   Lennette Bihari, MD  gabapentin (NEURONTIN) 600 MG tablet Take 1,200 mg by mouth at bedtime.      Historical Provider, MD  homatropine 2 % ophthalmic solution Place 2 drops into both eyes 2 (two) times daily.      Historical Provider, MD  ketorolac (ACULAR) 0.5 % ophthalmic solution Place 1 drop into both eyes 2 (two) times daily.    Historical Provider, MD  levofloxacin (LEVAQUIN) 750 MG tablet Take 1 tablet (750 mg total) by mouth daily. 07/23/15   Tomasita Crumble, MD  MECLIZINE HCL PO Take 1 tablet by mouth daily.    Historical Provider, MD  metoprolol tartrate (LOPRESSOR) 25 MG tablet Take 0.5 tablets (12.5 mg total) by mouth 2 (two) times daily. 12/21/14   Lennette Bihari, MD  mycophenolate (CELLCEPT) 500 MG tablet Take 1,000 mg by mouth 2 (two) times daily.    Historical Provider, MD  omeprazole (PRILOSEC) 20 MG capsule Take 1 capsule (20 mg total) by mouth daily. 08/18/13   Azalee Course, PA  prednisoLONE acetate (PRED FORTE) 1 % ophthalmic suspension Place 1 drop  into both eyes 3 (three) times daily.     Historical Provider, MD  traMADol (ULTRAM) 50 MG tablet Take 50 mg by mouth every 6 (six) hours as needed. Maximum dose= 8 tablets per day For pain    Historical Provider, MD  valACYclovir (VALTREX) 1000 MG tablet Take 1,000 mg by mouth daily.    Historical Provider, MD  warfarin (COUMADIN) 5 MG tablet Take 2.5-5 mg by mouth daily. Take 2.5mg  by mouth on Mon, take 5mg  by mouth every other day    Historical Provider, MD   BP 120/73 mmHg  Pulse 95  Temp(Src) 99.8 F (37.7 C) (Oral)  Resp 20  SpO2 96% Physical Exam  Constitutional: He is oriented to person, place, and time. Vital signs are normal. He appears well-developed and  well-nourished.  Non-toxic appearance. He does not appear ill. No distress.  HENT:  Head: Normocephalic and atraumatic.  Nose: Nose normal.  Mouth/Throat: Oropharynx is clear and moist. No oropharyngeal exudate.  Eyes: Conjunctivae and EOM are normal. Pupils are equal, round, and reactive to light. No scleral icterus.  Neck: Normal range of motion. Neck supple. No tracheal deviation, no edema, no erythema and normal range of motion present. No thyroid mass and no thyromegaly present.  Cardiovascular: Normal rate, regular rhythm, S1 normal, S2 normal, normal heart sounds, intact distal pulses and normal pulses.  Exam reveals no gallop and no friction rub.   No murmur heard. Pulmonary/Chest: Effort normal and breath sounds normal. No respiratory distress. He has no wheezes. He has no rhonchi. He has no rales.  Abdominal: Soft. Normal appearance and bowel sounds are normal. He exhibits no distension, no ascites and no mass. There is no hepatosplenomegaly. There is no tenderness. There is no rebound, no guarding and no CVA tenderness.  Musculoskeletal: Normal range of motion. He exhibits no edema or tenderness.  Left shoulder tenderness to palpation. Normal range of motion. Normal pulses distally.  Lymphadenopathy:    He has no cervical adenopathy.  Neurological: He is alert and oriented to person, place, and time. He has normal strength. No cranial nerve deficit or sensory deficit.  Skin: Skin is warm, dry and intact. No petechiae and no rash noted. He is not diaphoretic. No erythema. No pallor.  Psychiatric: He has a normal mood and affect. His behavior is normal. Judgment normal.  Nursing note and vitals reviewed.   ED Course  Procedures (including critical care time) Labs Review Labs Reviewed  BASIC METABOLIC PANEL - Abnormal; Notable for the following:    Glucose, Bld 110 (*)    Creatinine, Ser 1.61 (*)    GFR calc non Af Amer 49 (*)    GFR calc Af Amer 56 (*)    All other components  within normal limits  URINALYSIS, ROUTINE W REFLEX MICROSCOPIC (NOT AT [facility]) - Abnormal; Notable for the following:    Color, Urine RED (*)    APPearance TURBID (*)    Hgb urine dipstick LARGE (*)    Bilirubin Urine LARGE (*)    Ketones, ur 40 (*)    Protein, ur >300 (*)    Nitrite POSITIVE (*)    Leukocytes, UA LARGE (*)    All other components within normal limits  PROTIME-INR - Abnormal; Notable for the following:    Prothrombin Time 20.8 (*)    INR 1.79 (*)    All other components within normal limits  URINE MICROSCOPIC-ADD ON - Abnormal; Notable for the following:    Squamous Epithelial / LPF  0-5 (*)    Bacteria, UA RARE (*)    All other components within normal limits  CBC WITH DIFFERENTIAL/PLATELET    Imaging Review Dg Shoulder Left  07/23/2015  CLINICAL DATA:  Trip and fall injury yesterday. Left posterior shoulder pain. EXAM: LEFT SHOULDER - 2+ VIEW COMPARISON:  MRI left shoulder 02/15/2008 FINDINGS: Prominent degenerative changes in the glenohumeral joint with joint space narrowing and sclerosis. Prominent osteophytes on both sides of the joint. Small subacromial spur. No evidence of acute fracture or dislocation. No focal bone lesion or bone destruction. Soft tissues are unremarkable. IMPRESSION: Degenerative changes in the glenohumeral joint. No acute bony abnormalities. Electronically Signed   By: Burman Nieves M.D.   On: 07/23/2015 05:29   I have personally reviewed and evaluated these images and lab results as part of my medical decision-making.   EKG Interpretation None      MDM   Final diagnoses:  UTI (lower urinary tract infection)    Patient presents emergency department for dysuria and hematuria. UA reveals an infection. Hematuria likely because he is on Coumadin although his INR today is only 1.7. We'll place the patient on Levaquin. This medication is at risk for changing INR levels, this was instructed to the patient and he is to follow-up within 3  days for repeat check. He was given Dilaudid for pain control. X-ray of his left shoulder does not show any acute injuries. Encouraged Tylenol at home as needed for pain. He appears well and in no acute distress, vital signs were within his normal limits and he is safe for discharge.    Tomasita Crumble, MD 07/23/15 [phone]

## 2015-07-23 NOTE — Discharge Instructions (Signed)
Urinary Tract Infection Richard Shepherd, your urine shows an infection.  Take antibiotics as directed. Also use tylenol as needed for your shoulder pain, the xray does not show any fractures or dislocations.  See a primary care doctor within 3 days for close follow up and to have your INR checked again. Sometimes, antibiotics can change your INR level.  If symptoms worsen, come back to the ED immediately. Thank you. A urinary tract infection (UTI) can occur any place along the urinary tract. The tract includes the kidneys, ureters, bladder, and urethra. A type of germ called bacteria often causes a UTI. UTIs are often helped with antibiotic medicine.  HOME CARE   If given, take antibiotics as told by your doctor. Finish them even if you start to feel better.  Drink enough fluids to keep your pee (urine) clear or pale yellow.  Avoid tea, drinks with caffeine, and bubbly (carbonated) drinks.  Pee often. Avoid holding your pee in for a long time.  Pee before and after having sex (intercourse).  Wipe from front to back after you poop (bowel movement) if you are a woman. Use each tissue only once. GET HELP RIGHT AWAY IF:   You have back pain.  You have lower belly (abdominal) pain.  You have chills.  You feel sick to your stomach (nauseous).  You throw up (vomit).  Your burning or discomfort with peeing does not go away.  You have a fever.  Your symptoms are not better in 3 days. MAKE SURE YOU:   Understand these instructions.  Will watch your condition.  Will get help right away if you are not doing well or get worse.   This information is not intended to replace advice given to you by your health care provider. Make sure you discuss any questions you have with your health care provider.   Document Released: 08/12/2007 Document Revised: 03/16/2014 Document Reviewed: 09/24/2011 Elsevier Interactive Patient Education [employer]

## 2015-07-23 NOTE — ED Notes (Signed)
Pt. reports hematuria with mild dysuria onset this morning , pt. also tripped and fell at home last night reports mild left shoulder pain . Pt. is taking Coumadin .

## 2015-08-02 ENCOUNTER — Other Ambulatory Visit: Payer: Self-pay | Admitting: *Deleted

## 2015-08-02 MED ORDER — FUROSEMIDE 20 MG PO TABS: 20.0000 mg | ORAL_TABLET | Freq: Every day | ORAL | Status: DC

## 2015-08-15 ENCOUNTER — Other Ambulatory Visit: Payer: Self-pay | Admitting: Cardiovascular Disease

## 2015-10-28 DIAGNOSIS — G4733 Obstructive sleep apnea (adult) (pediatric): Secondary | ICD-10-CM | POA: Insufficient documentation

## 2015-10-28 DIAGNOSIS — R739 Hyperglycemia, unspecified: Secondary | ICD-10-CM | POA: Insufficient documentation

## 2015-11-06 DIAGNOSIS — E782 Mixed hyperlipidemia: Secondary | ICD-10-CM | POA: Insufficient documentation

## 2015-11-06 DIAGNOSIS — Z7901 Long term (current) use of anticoagulants: Secondary | ICD-10-CM | POA: Insufficient documentation

## 2015-11-13 ENCOUNTER — Other Ambulatory Visit: Payer: Self-pay | Admitting: Cardiovascular Disease

## 2015-11-21 ENCOUNTER — Other Ambulatory Visit: Payer: Self-pay | Admitting: Cardiovascular Disease

## 2016-03-09 DIAGNOSIS — I38 Endocarditis, valve unspecified: Secondary | ICD-10-CM

## 2016-03-09 HISTORY — DX: Endocarditis, valve unspecified: I38

## 2016-04-17 ENCOUNTER — Ambulatory Visit (HOSPITAL_COMMUNITY)
Admission: EM | Admit: 2016-04-17 | Discharge: 2016-04-17 | Disposition: A | Discharge status: Home / Self Care | Payer: Medicare Other | Attending: Family Medicine | Admitting: Family Medicine

## 2016-04-17 ENCOUNTER — Encounter (HOSPITAL_COMMUNITY): Payer: Self-pay | Admitting: Emergency Medicine

## 2016-04-17 DIAGNOSIS — M25519 Pain in unspecified shoulder: Secondary | ICD-10-CM | POA: Diagnosis not present

## 2016-04-17 MED ORDER — KETOROLAC TROMETHAMINE 60 MG/2ML IM SOLN
60.0000 mg | Freq: Once | INTRAMUSCULAR | Status: AC
  Administered 2016-04-17: 60 mg via INTRAMUSCULAR

## 2016-04-17 MED ORDER — KETOROLAC TROMETHAMINE 60 MG/2ML IM SOLN
INTRAMUSCULAR | Status: AC
  Filled 2016-04-17: qty 2

## 2016-04-17 MED ORDER — CYCLOBENZAPRINE HCL 10 MG PO TABS: 10.0000 mg | ORAL_TABLET | Freq: Two times a day (BID) | ORAL | 0 refills | Status: DC | PRN

## 2016-04-17 MED ORDER — METHOCARBAMOL 500 MG PO TABS: 500.0000 mg | ORAL_TABLET | Freq: Two times a day (BID) | ORAL | 0 refills | Status: DC

## 2016-04-17 NOTE — ED Provider Notes (Signed)
CSN: [id number]     Arrival date & time 04/17/16  1652 History   None    Chief Complaint  Patient presents with  . Shoulder Pain   (Consider location/radiation/quality/duration/timing/severity/associated sxs/prior Treatment) Patient c/o bilateral shoulder discomfort.  He has hx of arthritis.  He went bowling a week ago and now he's having bilateral shoulder discomfort.   The history is provided by the patient.  Shoulder Pain  Injury: no   Pain details:    Quality:  Aching   Radiates to:  Does not radiate   Severity:  Moderate   Onset quality:  Sudden   Duration:  1 week Dislocation: no   Foreign body present:  No foreign bodies Tetanus status:  Unknown   Past Medical History:  Diagnosis Date  . Asthma   . DVT (deep venous thrombosis) (HCC)    a. mainly affect R leg  . Sarcoidosis (HCC)    a. eye involvement only   Past Surgical History:  Procedure Laterality Date  . CATARACT EXTRACTION  2012  . EYE SURGERY Bilateral 2015   scraped calcium from cornea  . GLAUCOMA SURGERY  2012   Family History  Problem Relation Age of Onset  . Breast cancer Mother   . Multiple sclerosis Father   . Multiple sclerosis Brother   . Diabetes Brother    Social History  Substance Use Topics  . Smoking status: Never Smoker  . Smokeless tobacco: Never Used  . Alcohol use Yes    Review of Systems  Constitutional: Negative.   HENT: Negative.   Eyes: Negative.   Respiratory: Negative.   Cardiovascular: Negative.   Gastrointestinal: Negative.   Endocrine: Negative.   Genitourinary: Negative.   Musculoskeletal: Positive for arthralgias.  Allergic/Immunologic: Negative.   Neurological: Negative.   Hematological: Negative.   Psychiatric/Behavioral: Negative.     Allergies  Patient has no known allergies.  Home Medications   Prior to Admission medications   Medication Sig Start Date End Date Taking? Authorizing Provider  brimonidine (ALPHAGAN) 0.2 % ophthalmic solution Place  1 drop into both eyes 3 (three) times daily.   Yes Historical Provider, MD  furosemide (LASIX) 20 MG tablet take 1 tablet by mouth once daily 11/21/15  Yes Lennette Bihari, MD  gabapentin (NEURONTIN) 600 MG tablet Take 1,200 mg by mouth at bedtime.     Yes Historical Provider, MD  homatropine 2 % ophthalmic solution Place 2 drops into both eyes 2 (two) times daily.     Yes Historical Provider, MD  ketorolac (ACULAR) 0.5 % ophthalmic solution Place 1 drop into both eyes 2 (two) times daily.   Yes Historical Provider, MD  levofloxacin (LEVAQUIN) 750 MG tablet Take 1 tablet (750 mg total) by mouth daily. 07/23/15  Yes Tomasita Crumble, MD  mycophenolate (CELLCEPT) 500 MG tablet Take 1,000 mg by mouth 2 (two) times daily.   Yes Historical Provider, MD  traMADol (ULTRAM) 50 MG tablet Take 50 mg by mouth every 6 (six) hours as needed. Maximum dose= 8 tablets per day For pain   Yes Historical Provider, MD  valACYclovir (VALTREX) 1000 MG tablet Take 1,000 mg by mouth daily.   Yes Historical Provider, MD  warfarin (COUMADIN) 5 MG tablet Take 2.5-5 mg by mouth daily. Take 2.5mg  by mouth on Mon, take 5mg  by mouth every other day   Yes Historical Provider, MD  ALBUTEROL IN Inhale 2 puffs into the lungs as needed (wheezing).    Historical Provider, MD  calcium-vitamin D Ruthell Rummage  WITH D) 500-200 MG-UNIT per tablet Take 1 tablet by mouth daily with breakfast.    Historical Provider, MD  colchicine 0.6 MG tablet Take 1 tablet (0.6 mg total) by mouth daily. 08/18/13   Azalee Course, PA  methocarbamol (ROBAXIN) 500 MG tablet Take 1 tablet (500 mg total) by mouth 2 (two) times daily. 04/17/16   Deatra Canter, FNP  metoprolol tartrate (LOPRESSOR) 25 MG tablet Take 0.5 tablets (12.5 mg total) by mouth 2 (two) times daily. 12/21/14   Lennette Bihari, MD  omeprazole (PRILOSEC) 20 MG capsule Take 1 capsule (20 mg total) by mouth daily. 08/18/13   Azalee Course, PA  prednisoLONE acetate (PRED FORTE) 1 % ophthalmic suspension Place 1 drop into  both eyes 3 (three) times daily.     Historical Provider, MD   Meds Ordered and Administered this Visit   Medications  ketorolac (TORADOL) injection 60 mg (not administered)    BP 147/77 (BP Location: Right Arm)   Pulse 79   Temp 98.5 F (36.9 C) (Oral)   Resp 20   SpO2 98%  No data found.   Physical Exam  Constitutional: He appears well-developed and well-nourished.  HENT:  Head: Normocephalic and atraumatic.  Right Ear: External ear normal.  Left Ear: External ear normal.  Mouth/Throat: Oropharynx is clear and moist.  Eyes: Conjunctivae and EOM are normal. Pupils are equal, round, and reactive to light.  Neck: Normal range of motion. Neck supple.  Cardiovascular: Normal rate, regular rhythm and normal heart sounds.   Pulmonary/Chest: Effort normal and breath sounds normal.  Musculoskeletal:  Tenderness bilateral shoulders and decreased ROM bilateral with internal/external rotation.  Nursing note and vitals reviewed.   Urgent Care Course     Procedures (including critical care time)  Labs Review Labs Reviewed - No data to display  Imaging Review No results found.   Visual Acuity Review  Right Eye Distance:   Left Eye Distance:   Bilateral Distance:    Right Eye Near:   Left Eye Near:    Bilateral Near:         MDM   1. Acute shoulder pain, unspecified laterality    Toradol 60mg  IM Robaxin 500mg  po bid prn #20      Deatra Canter, FNP 04/17/16 1733

## 2016-04-17 NOTE — ED Triage Notes (Signed)
Pt c/o bilateral shoulder pain onset 1 week... Reports he aggravated sx while bowling  Hx of arthritis, bursitis  Pain increases w/activity.... Taking tramadol, aleve w/temp relief.   A&O x4... NAD

## 2016-06-14 ENCOUNTER — Emergency Department (HOSPITAL_COMMUNITY): Payer: Medicare Other

## 2016-06-14 ENCOUNTER — Encounter (HOSPITAL_COMMUNITY): Payer: Self-pay | Admitting: Emergency Medicine

## 2016-06-14 ENCOUNTER — Emergency Department (HOSPITAL_COMMUNITY)
Admission: EM | Admit: 2016-06-14 | Discharge: 2016-06-14 | Disposition: A | Discharge status: Home / Self Care | Payer: Medicare Other | Attending: Emergency Medicine | Admitting: Emergency Medicine

## 2016-06-14 DIAGNOSIS — R1031 Right lower quadrant pain: Secondary | ICD-10-CM

## 2016-06-14 DIAGNOSIS — N189 Chronic kidney disease, unspecified: Secondary | ICD-10-CM | POA: Insufficient documentation

## 2016-06-14 DIAGNOSIS — M25512 Pain in left shoulder: Secondary | ICD-10-CM | POA: Diagnosis present

## 2016-06-14 DIAGNOSIS — J45909 Unspecified asthma, uncomplicated: Secondary | ICD-10-CM | POA: Insufficient documentation

## 2016-06-14 DIAGNOSIS — W19XXXA Unspecified fall, initial encounter: Secondary | ICD-10-CM

## 2016-06-14 DIAGNOSIS — Y9302 Activity, running: Secondary | ICD-10-CM | POA: Insufficient documentation

## 2016-06-14 DIAGNOSIS — Y92009 Unspecified place in unspecified non-institutional (private) residence as the place of occurrence of the external cause: Secondary | ICD-10-CM | POA: Diagnosis not present

## 2016-06-14 DIAGNOSIS — W109XXA Fall (on) (from) unspecified stairs and steps, initial encounter: Secondary | ICD-10-CM | POA: Insufficient documentation

## 2016-06-14 DIAGNOSIS — W109XXS Fall (on) (from) unspecified stairs and steps, sequela: Secondary | ICD-10-CM

## 2016-06-14 DIAGNOSIS — R1030 Lower abdominal pain, unspecified: Secondary | ICD-10-CM | POA: Insufficient documentation

## 2016-06-14 DIAGNOSIS — Y999 Unspecified external cause status: Secondary | ICD-10-CM | POA: Diagnosis not present

## 2016-06-14 DIAGNOSIS — R1032 Left lower quadrant pain: Secondary | ICD-10-CM

## 2016-06-14 MED ORDER — MELOXICAM 7.5 MG PO TABS: 7.5000 mg | ORAL_TABLET | Freq: Every day | ORAL | 0 refills | Status: DC

## 2016-06-14 MED ORDER — KETOROLAC TROMETHAMINE 60 MG/2ML IM SOLN
60.0000 mg | Freq: Once | INTRAMUSCULAR | Status: AC
  Administered 2016-06-14: 60 mg via INTRAMUSCULAR
  Filled 2016-06-14: qty 2

## 2016-06-14 MED ORDER — METHOCARBAMOL 500 MG PO TABS
500.0000 mg | ORAL_TABLET | Freq: Two times a day (BID) | ORAL | 0 refills | Status: DC
Start: 2016-06-14 — End: 2016-07-28

## 2016-06-14 NOTE — Discharge Instructions (Signed)
Return to the ED with any concerns including increased pain, weakness of arms or legs, difficulty breathing, or any other alarming symptoms

## 2016-06-14 NOTE — ED Triage Notes (Signed)
Pt reports onset Thursday while rushing to leave the house pt fell down 15 carpeted stairs inside his home. Pt reports he felt fine afterwards and was able to go to class and workout. Pt reports began having pain in left shoulder and B/L groin area onset yesterday. Pt tried tramadol, capsin and green rubbing alcohol without relief.

## 2016-06-14 NOTE — ED Notes (Signed)
Pt noted to have irregular heartbeat.  5 lead EKG shows vent bigeminy.  Dr. Karma Ganja made aware, no orders obtained.  Pt not having any chest pain.

## 2016-06-14 NOTE — ED Provider Notes (Signed)
[facility]-EMERGENCY DEPT Provider Note   CSN: [id number] Arrival date & time: 06/14/16  1603   By signing my name below, I, Richard Shepherd, attest that this documentation has been prepared under the direction and in the presence of Richard Scott, MD. Electronically Signed: Soijett Shepherd, ED Scribe. 06/14/16. 7:18 PM.  History   Chief Complaint Chief Complaint  Patient presents with  . Fall  . Shoulder Pain  . Pelvic Pain    HPI Richard Shepherd is a 51 y.o. male who presents to the Emergency Department complaining of fall onset 3 days ago. He notes that he was running to leave the house when he fell down 15 carpeted stairs and landed on his left shoulder while in his home. Pt reports that he was able to workout following the incident with no immediate pain. He states that he was evaluated by his PCP 2 days ago following the fall and had his INR checked and it was 2.5. Pt reports gradually worsening associated left shoulder pain, bruising to posterior left shoulder, and bilateral groin pain. Pt has tried tramadol and aleve with no relief of his symptoms. Denies hitting his head, LOC, back pain, HA, vomiting, vision change, and any other symptoms. He states that he is on daily coumadin.   The history is provided by the patient. No language interpreter was used.    Past Medical History:  Diagnosis Date  . Asthma   . DVT (deep venous thrombosis) (HCC)    a. mainly affect R leg  . Sarcoidosis (HCC)    a. eye involvement only    Patient Active Problem List   Diagnosis Date Noted  . Chest pain 08/16/2013  . Pericarditis 08/16/2013  . LEG EDEMA 10/17/2007  . Sarcoidosis (HCC) 06/22/2006  . DVT 06/22/2006  . Chronic kidney disease 06/22/2006    Past Surgical History:  Procedure Laterality Date  . CATARACT EXTRACTION  2012  . EYE SURGERY Bilateral 2015   scraped calcium from cornea  . GLAUCOMA SURGERY  2012       Home Medications    Prior to Admission medications   Medication  Sig Start Date End Date Taking? Authorizing Provider  ALBUTEROL IN Inhale 2 puffs into the lungs as needed (wheezing).    Historical Provider, MD  brimonidine (ALPHAGAN) 0.2 % ophthalmic solution Place 1 drop into both eyes 3 (three) times daily.    Historical Provider, MD  calcium-vitamin D (OSCAL WITH D) 500-200 MG-UNIT per tablet Take 1 tablet by mouth daily with breakfast.    Historical Provider, MD  colchicine 0.6 MG tablet Take 1 tablet (0.6 mg total) by mouth daily. 08/18/13   Azalee Course, PA  furosemide (LASIX) 20 MG tablet take 1 tablet by mouth once daily 11/21/15   Lennette Bihari, MD  gabapentin (NEURONTIN) 600 MG tablet Take 1,200 mg by mouth at bedtime.      Historical Provider, MD  homatropine 2 % ophthalmic solution Place 2 drops into both eyes 2 (two) times daily.      Historical Provider, MD  ketorolac (ACULAR) 0.5 % ophthalmic solution Place 1 drop into both eyes 2 (two) times daily.    Historical Provider, MD  levofloxacin (LEVAQUIN) 750 MG tablet Take 1 tablet (750 mg total) by mouth daily. 07/23/15   Tomasita Crumble, MD  methocarbamol (ROBAXIN) 500 MG tablet Take 1 tablet (500 mg total) by mouth 2 (two) times daily. 04/17/16   Deatra Canter, FNP  metoprolol tartrate (LOPRESSOR) 25 MG tablet Take 0.5  tablets (12.5 mg total) by mouth 2 (two) times daily. 12/21/14   Lennette Bihari, MD  mycophenolate (CELLCEPT) 500 MG tablet Take 1,000 mg by mouth 2 (two) times daily.    Historical Provider, MD  omeprazole (PRILOSEC) 20 MG capsule Take 1 capsule (20 mg total) by mouth daily. 08/18/13   Azalee Course, PA  prednisoLONE acetate (PRED FORTE) 1 % ophthalmic suspension Place 1 drop into both eyes 3 (three) times daily.     Historical Provider, MD  traMADol (ULTRAM) 50 MG tablet Take 50 mg by mouth every 6 (six) hours as needed. Maximum dose= 8 tablets per day For pain    Historical Provider, MD  valACYclovir (VALTREX) 1000 MG tablet Take 1,000 mg by mouth daily.    Historical Provider, MD  warfarin  (COUMADIN) 5 MG tablet Take 2.5-5 mg by mouth daily. Take 2.5mg  by mouth on Mon, take 5mg  by mouth every other day    Historical Provider, MD    Family History Family History  Problem Relation Age of Onset  . Breast cancer Mother   . Multiple sclerosis Father   . Multiple sclerosis Brother   . Diabetes Brother     Social History Social History  Substance Use Topics  . Smoking status: Never Smoker  . Smokeless tobacco: Never Used  . Alcohol use Yes     Allergies   Patient has no known allergies.   Review of Systems Review of Systems  All other systems reviewed and are negative.    Physical Exam Updated Vital Signs BP 129/81   Pulse 85   Temp 98.5 F (36.9 C) (Oral)   Resp 18   Ht 5\' 10"  (1.778 m)   Wt 238 lb (108 kg)   SpO2 100%   BMI 34.15 kg/m   Physical Exam  Constitutional: He is oriented to person, place, and time. He appears well-developed and well-nourished. No distress.  HENT:  Head: Normocephalic and atraumatic.  Eyes: EOM are normal.  Neck: Neck supple.  Cardiovascular: Normal rate.   Pulmonary/Chest: Effort normal. No respiratory distress.  Abdominal: He exhibits no distension.  Musculoskeletal: Normal range of motion.       Left shoulder: He exhibits tenderness.       Right hip: He exhibits tenderness. He exhibits no bony tenderness.       Left hip: He exhibits tenderness. He exhibits no bony tenderness.       Cervical back: Normal.       Thoracic back: Normal.       Lumbar back: Normal.  Diffuse tenderness of left shoulder with pain with range of motion. TTP over hip flexors and inguinal region bilaterally. No bony point tenderness. Pelvis stable. No midline tenderness of C, T, or L spine.  Neurological: He is alert and oriented to person, place, and time.  Skin: Skin is warm and dry.  Psychiatric: He has a normal mood and affect. His behavior is normal.  Nursing note and vitals reviewed.    ED Treatments / Results  DIAGNOSTIC  STUDIES: Oxygen Saturation is 100% on RA, nl by my interpretation.    COORDINATION OF CARE: 7:14 PM Discussed treatment plan with pt at bedside which includes left shoulder xray, hips bilateral with pelvis xray, and pt agreed to plan.   Radiology Dg Shoulder Left  Result Date: 06/14/2016 CLINICAL DATA:  Larey Seat downstairs 4 days ago. Persistent shoulder pain. EXAM: LEFT SHOULDER - 2+ VIEW COMPARISON:  Os radiographs 07/23/2015 FINDINGS: Advanced glenohumeral joint degenerative changes  but no acute fracture or dislocation. The [facility] joint is intact with stable mild degenerative changes. Incidental os acromial noted. IMPRESSION: Advanced glenohumeral joint degenerative changes but no fracture or dislocation. Electronically Signed   By: Rudie Meyer M.D.   On: 06/14/2016 17:57   Dg Hips Bilat With Pelvis Min 5 Views  Result Date: 06/14/2016 CLINICAL DATA:  Larey Seat downstairs 4 days ago.  Persistent hip pain. EXAM: DG HIP (WITH OR WITHOUT PELVIS) 5+V BILAT COMPARISON:  None FINDINGS: Both hips are normally located. Mild degenerative changes bilaterally. No fracture or AVN. The pubic symphysis and SI joints are intact. No definite pelvic fractures. IMPRESSION: Mild degenerative changes bilaterally but no hip or pelvic fractures. Electronically Signed   By: Rudie Meyer M.D.   On: 06/14/2016 18:01    Procedures Procedures (including critical care time)  Medications Ordered in ED Medications - No data to display   Initial Impression / Assessment and Plan / ED Course  I have reviewed the triage vital signs and the nursing notes.  Pertinent imaging results that were available during my care of the patient were reviewed by me and considered in my medical decision making (see chart for details).     Pt presenting with c/o pain in left shoulder and bilateral groin after fall several days ago at home- he had a mechanical fall down his staircase- initially had no pain then developed pain in groin- has  tenderness over bilateral hip flexors on exam- xrays are reassuring, pt has hx of arthritis in left shoulder, xray shows this but no acute findings.  Pt given IM toradol to help with pain.  He will f/u with his orthopedic.  Discharged with strict return precautions.  Pt agreeable with plan.  Final Clinical Impressions(s) / ED Diagnoses   Final diagnoses:  Fall (on) (from) unspecified stairs and steps, sequela    New Prescriptions New Prescriptions   No medications on file   I personally performed the services described in this documentation, which was scribed in my presence. The recorded information has been reviewed and is accurate.      Richard Scott, MD 06/14/16 2025

## 2016-07-28 ENCOUNTER — Encounter (HOSPITAL_COMMUNITY): Payer: Self-pay

## 2016-07-28 ENCOUNTER — Emergency Department (HOSPITAL_COMMUNITY)
Admission: EM | Admit: 2016-07-28 | Discharge: 2016-07-28 | Disposition: A | Discharge status: Home / Self Care | Payer: Medicare Other | Attending: Emergency Medicine | Admitting: Emergency Medicine

## 2016-07-28 DIAGNOSIS — J45909 Unspecified asthma, uncomplicated: Secondary | ICD-10-CM | POA: Insufficient documentation

## 2016-07-28 DIAGNOSIS — M79604 Pain in right leg: Secondary | ICD-10-CM | POA: Insufficient documentation

## 2016-07-28 DIAGNOSIS — N189 Chronic kidney disease, unspecified: Secondary | ICD-10-CM | POA: Diagnosis not present

## 2016-07-28 LAB — CBC WITH DIFFERENTIAL/PLATELET
BASOS ABS: 0.1 10*3/uL (ref 0.0–0.1)
BASOS PCT: 1 %
EOS ABS: 0.2 10*3/uL (ref 0.0–0.7)
EOS PCT: 4 %
HCT: 37.4 % — ABNORMAL LOW (ref 39.0–52.0)
Hemoglobin: 12.6 g/dL — ABNORMAL LOW (ref 13.0–17.0)
LYMPHS PCT: 19 %
Lymphs Abs: 1.2 10*3/uL (ref 0.7–4.0)
MCH: 29.9 pg (ref 26.0–34.0)
MCHC: 33.7 g/dL (ref 30.0–36.0)
MCV: 88.6 fL (ref 78.0–100.0)
MONO ABS: 0.4 10*3/uL (ref 0.1–1.0)
Monocytes Relative: 7 %
Neutro Abs: 4.5 10*3/uL (ref 1.7–7.7)
Neutrophils Relative %: 69 %
PLATELETS: 265 10*3/uL (ref 150–400)
RBC: 4.22 MIL/uL (ref 4.22–5.81)
RDW: 13.3 % (ref 11.5–15.5)
WBC: 6.3 10*3/uL (ref 4.0–10.5)

## 2016-07-28 LAB — BASIC METABOLIC PANEL
ANION GAP: 8 (ref 5–15)
BUN: 18 mg/dL (ref 6–20)
CALCIUM: 9.4 mg/dL (ref 8.9–10.3)
CO2: 22 mmol/L (ref 22–32)
CREATININE: 1.4 mg/dL — AB (ref 0.61–1.24)
Chloride: 105 mmol/L (ref 101–111)
GFR calc Af Amer: >60 mL/min (ref >60)
GFR, EST NON AFRICAN AMERICAN: 57 mL/min — AB (ref >60)
GLUCOSE: 98 mg/dL (ref 65–99)
Potassium: 3.9 mmol/L (ref 3.5–5.1)
Sodium: 135 mmol/L (ref 135–145)

## 2016-07-28 LAB — PROTIME-INR
INR: 3.27
Prothrombin Time: 34.1 seconds — ABNORMAL HIGH (ref 11.4–15.2)

## 2016-07-28 LAB — APTT: aPTT: 91 seconds — ABNORMAL HIGH (ref 24–36)

## 2016-07-28 MED ORDER — HYDROCODONE-ACETAMINOPHEN 5-325 MG PO TABS: 1.0000 | ORAL_TABLET | ORAL | 0 refills | Status: DC | PRN

## 2016-07-28 MED ORDER — METHOCARBAMOL 500 MG PO TABS: 500.0000 mg | ORAL_TABLET | Freq: Two times a day (BID) | ORAL | 0 refills | Status: DC

## 2016-07-28 NOTE — ED Provider Notes (Signed)
[facility] DEPT Provider Note   CSN: [id number] Arrival date & time: 07/28/16  1726     History   Chief Complaint No chief complaint on file.   HPI Richard Shepherd is a 51 y.o. male.  Patient with history of sarcoidosis, DVT on Coumadin, CKD presents with complaint of right leg pain and intermittent swelling x 3 days. No fever. He is not aware of any injury. He states he is compliant with his coumadin and other medications. The pain is most noticeable when he rests at night. It is not significant in the mornings or throughout the day. No SOB or chest pain.   The history is provided by the patient. No language interpreter was used.    Past Medical History:  Diagnosis Date  . Asthma   . DVT (deep venous thrombosis) (HCC)    a. mainly affect R leg  . Sarcoidosis    a. eye involvement only    Patient Active Problem List   Diagnosis Date Noted  . Chest pain 08/16/2013  . Pericarditis 08/16/2013  . LEG EDEMA 10/17/2007  . Sarcoidosis 06/22/2006  . DVT 06/22/2006  . Chronic kidney disease 06/22/2006    Past Surgical History:  Procedure Laterality Date  . CATARACT EXTRACTION  2012  . EYE SURGERY Bilateral 2015   scraped calcium from cornea  . GLAUCOMA SURGERY  2012       Home Medications    Prior to Admission medications   Medication Sig Start Date End Date Taking? Authorizing Provider  ALBUTEROL IN Inhale 2 puffs into the lungs as needed (wheezing).    [provider]  brimonidine (ALPHAGAN) 0.2 % ophthalmic solution Place 1 drop into both eyes 3 (three) times daily.    [provider]  calcium-vitamin D (OSCAL WITH D) 500-200 MG-UNIT per tablet Take 1 tablet by mouth daily with breakfast.    [provider]  colchicine 0.6 MG tablet Take 1 tablet (0.6 mg total) by mouth daily. 08/18/13   Azalee Course, PA  furosemide (LASIX) 20 MG tablet take 1 tablet by mouth once daily 11/21/15   Lennette Bihari, MD  gabapentin (NEURONTIN) 600 MG  tablet Take 1,200 mg by mouth at bedtime.      [provider]  homatropine 2 % ophthalmic solution Place 2 drops into both eyes 2 (two) times daily.      [provider]  ketorolac (ACULAR) 0.5 % ophthalmic solution Place 1 drop into both eyes 2 (two) times daily.    [provider]  levofloxacin (LEVAQUIN) 750 MG tablet Take 1 tablet (750 mg total) by mouth daily. 07/23/15   Tomasita Crumble, MD  meloxicam (MOBIC) 7.5 MG tablet Take 1 tablet (7.5 mg total) by mouth daily. 06/14/16   [name], MD  methocarbamol (ROBAXIN) 500 MG tablet Take 1 tablet (500 mg total) by mouth 2 (two) times daily. 06/14/16   [name], MD  metoprolol tartrate (LOPRESSOR) 25 MG tablet Take 0.5 tablets (12.5 mg total) by mouth 2 (two) times daily. 12/21/14   Lennette Bihari, MD  mycophenolate (CELLCEPT) 500 MG tablet Take 1,000 mg by mouth 2 (two) times daily.    [provider]  omeprazole (PRILOSEC) 20 MG capsule Take 1 capsule (20 mg total) by mouth daily. 08/18/13   Azalee Course, PA  prednisoLONE acetate (PRED FORTE) 1 % ophthalmic suspension Place 1 drop into both eyes 3 (three) times daily.     [provider]  traMADol (ULTRAM) 50 MG tablet  Take 50 mg by mouth every 6 (six) hours as needed. Maximum dose= 8 tablets per day For pain    [provider]  valACYclovir (VALTREX) 1000 MG tablet Take 1,000 mg by mouth daily.    [provider]  warfarin (COUMADIN) 5 MG tablet Take 2.5-5 mg by mouth daily. Take 2.5mg  by mouth on Mon, take 5mg  by mouth every other day    [provider]    Family History Family History  Problem Relation Age of Onset  . Breast cancer Mother   . Multiple sclerosis Father   . Multiple sclerosis Brother   . Diabetes Brother     Social History Social History  Substance Use Topics  . Smoking status: Never Smoker  . Smokeless tobacco: Never Used  . Alcohol use Yes     Allergies   Patient has no known  allergies.   Review of Systems Review of Systems  Constitutional: Negative for chills and fever.  Respiratory: Negative for shortness of breath.   Cardiovascular: Negative for chest pain.  Gastrointestinal: Negative for abdominal pain.  Musculoskeletal:       See HPI.  Skin: Negative for color change and wound.  Neurological: Negative for weakness and numbness.     Physical Exam Updated Vital Signs BP 126/87 (BP Location: Right Arm)   Pulse 79   Temp 98.7 F (37.1 C) (Oral)   Resp 18   Ht 5\' 10"  (1.778 m)   Wt 108 kg (238 lb)   SpO2 100%   BMI 34.15 kg/m   Physical Exam  Constitutional: He is oriented to person, place, and time. He appears well-developed and well-nourished. No distress.  HENT:  Head: Normocephalic.  Neck: Normal range of motion. Neck supple.  Cardiovascular: Normal rate, regular rhythm and intact distal pulses.   No murmur heard. Pulmonary/Chest: Effort normal and breath sounds normal. He has no wheezes. He has no rales.  Abdominal: Soft. Bowel sounds are normal. There is no tenderness. There is no rebound and no guarding.  Musculoskeletal: Normal range of motion. He exhibits no edema or tenderness.  Right leg is normal in appearance. No swelling or discoloration. Nontender to palpation of calf or thigh. No palpable mass. FROM.  Neurological: He is alert and oriented to person, place, and time.  Skin: Skin is warm and dry. No rash noted.  Psychiatric: He has a normal mood and affect.     ED Treatments / Results  Labs (all labs ordered are listed, but only abnormal results are displayed) Labs Reviewed  CBC WITH DIFFERENTIAL/PLATELET - Abnormal; Notable for the following:       Result Value   Hemoglobin 12.6 (*)    HCT 37.4 (*)    All other components within normal limits  BASIC METABOLIC PANEL - Abnormal; Notable for the following:    Creatinine, Ser 1.40 (*)    GFR calc non Af Amer 57 (*)    All other components within normal limits   PROTIME-INR - Abnormal; Notable for the following:    Prothrombin Time 34.1 (*)    All other components within normal limits  APTT - Abnormal; Notable for the following:    aPTT 91 (*)    All other components within normal limits   Results for orders placed or performed during the hospital encounter of 07/28/16  CBC with Differential  Result Value Ref Range   WBC 6.3 4.0 - 10.5 K/uL   RBC 4.22 4.22 - 5.81 MIL/uL   Hemoglobin 12.6 (  L) 13.0 - 17.0 g/dL   HCT 16.1 (L) 09.6 - 04.5 %   MCV 88.6 78.0 - 100.0 fL   MCH 29.9 26.0 - 34.0 pg   MCHC 33.7 30.0 - 36.0 g/dL   RDW 40.9 81.1 - 91.4 %   Platelets 265 150 - 400 K/uL   Neutrophils Relative % 69 %   Neutro Abs 4.5 1.7 - 7.7 K/uL   Lymphocytes Relative 19 %   Lymphs Abs 1.2 0.7 - 4.0 K/uL   Monocytes Relative 7 %   Monocytes Absolute 0.4 0.1 - 1.0 K/uL   Eosinophils Relative 4 %   Eosinophils Absolute 0.2 0.0 - 0.7 K/uL   Basophils Relative 1 %   Basophils Absolute 0.1 0.0 - 0.1 K/uL  Basic metabolic panel  Result Value Ref Range   Sodium 135 135 - 145 mmol/L   Potassium 3.9 3.5 - 5.1 mmol/L   Chloride 105 101 - 111 mmol/L   CO2 22 22 - 32 mmol/L   Glucose, Bld 98 65 - 99 mg/dL   BUN 18 6 - 20 mg/dL   Creatinine, Ser 7.82 (H) 0.61 - 1.24 mg/dL   Calcium 9.4 8.9 - 95.6 mg/dL   GFR calc non Af Amer 57 (L) >60 mL/min   GFR calc Af Amer >60 >60 mL/min   Anion gap 8 5 - 15  Protime-INR  Result Value Ref Range   Prothrombin Time 34.1 (H) 11.4 - 15.2 seconds   INR 3.27   APTT  Result Value Ref Range   aPTT 91 (H) 24 - 36 seconds    EKG  EKG Interpretation None       Radiology No results found.  Procedures Procedures (including critical care time)  Medications Ordered in ED Medications - No data to display   Initial Impression / Assessment and Plan / ED Course  I have reviewed the triage vital signs and the nursing notes.  Pertinent labs & imaging results that were available during my care of the patient  were reviewed by me and considered in my medical decision making (see chart for details).     Patient with a history of DVT in the right leg, therapeutic on Coumadin presents with c/o right leg pain with intermittent swelling. Today there is no swelling or physical exam finding. Labs unremarkable.   Do not have evidence of infection/cellulitis. He reports he hurt his back in a fall in early April of this year - no fracture - but continues to have pain in the right hip. Potentially the cause of pain today. Will have doppler studies in the morning to insure that clot burden is not worsening while well anticoagulated. He will follow up with PCP at [facility].   Final Clinical Impressions(s) / ED Diagnoses   Final diagnoses:  None   1. Right leg pain  New Prescriptions New Prescriptions   No medications on file     Danne Harbor 07/28/16 2158    Tilden Fossa, MD 07/29/16 [phone]

## 2016-07-28 NOTE — ED Notes (Signed)
Pt st's he was bitten by a snake on left lower leg.  Pt does not have any puncture wounds but left lower leg is swollen

## 2016-07-28 NOTE — ED Triage Notes (Signed)
Pt presents with 3 day h/o R leg pain.  Pt reports intermittent swelling, reports leg has been warm to touch; denies injury, reports h/o DVT in R leg, last INR 2.5 x 2 weeks ago.

## 2016-07-28 NOTE — ED Notes (Signed)
Pt c/o pain to right lat thigh x's 2 1/2 weeks.

## 2016-07-28 NOTE — Discharge Instructions (Signed)
Return in the morning for a doppler study of the right leg. [facility] [place] [facility] in one week. Return here with any worsening symptoms or new concern.

## 2016-07-29 ENCOUNTER — Encounter (HOSPITAL_COMMUNITY): Payer: Medicare Other

## 2016-09-10 ENCOUNTER — Emergency Department (HOSPITAL_COMMUNITY): Payer: Medicare Other

## 2016-09-10 ENCOUNTER — Emergency Department (HOSPITAL_COMMUNITY)
Admission: EM | Admit: 2016-09-10 | Discharge: 2016-09-10 | Disposition: A | Discharge status: Home / Self Care | Payer: Medicare Other | Attending: Emergency Medicine | Admitting: Emergency Medicine

## 2016-09-10 ENCOUNTER — Encounter (HOSPITAL_COMMUNITY): Payer: Self-pay | Admitting: Emergency Medicine

## 2016-09-10 DIAGNOSIS — Z7901 Long term (current) use of anticoagulants: Secondary | ICD-10-CM | POA: Insufficient documentation

## 2016-09-10 DIAGNOSIS — T82528A Displacement of other cardiac and vascular devices and implants, initial encounter: Secondary | ICD-10-CM

## 2016-09-10 DIAGNOSIS — Y712 Prosthetic and other implants, materials and accessory cardiovascular devices associated with adverse incidents: Secondary | ICD-10-CM | POA: Insufficient documentation

## 2016-09-10 DIAGNOSIS — J45909 Unspecified asthma, uncomplicated: Secondary | ICD-10-CM | POA: Diagnosis not present

## 2016-09-10 DIAGNOSIS — N189 Chronic kidney disease, unspecified: Secondary | ICD-10-CM | POA: Diagnosis not present

## 2016-09-10 DIAGNOSIS — T82898A Other specified complication of vascular prosthetic devices, implants and grafts, initial encounter: Secondary | ICD-10-CM | POA: Insufficient documentation

## 2016-09-10 DIAGNOSIS — T82524A Displacement of infusion catheter, initial encounter: Secondary | ICD-10-CM

## 2016-09-10 DIAGNOSIS — Z79899 Other long term (current) drug therapy: Secondary | ICD-10-CM | POA: Diagnosis not present

## 2016-09-10 MED ORDER — DEXTROSE 5 % IV SOLN: 500.0000 mg | Freq: Once | INTRAVENOUS | Status: DC

## 2016-09-10 NOTE — ED Notes (Signed)
Delay explained to pt and family 

## 2016-09-10 NOTE — ED Notes (Signed)
PCXR at room

## 2016-09-10 NOTE — ED Triage Notes (Signed)
Pt. Stated, I was at the [facility] hospital for picline for endocarditis and when it was flushed this morning it would not flush. [facility] told [facility] come [facility].

## 2016-09-10 NOTE — Discharge Instructions (Signed)
As discussed, your evaluation today has been largely reassuring.  But, it is important that you monitor your condition carefully, and do not hesitate to return to the ED if you develop new, or concerning changes in your condition. ? ?Otherwise, please follow-up with your physician for appropriate ongoing care. ? ?

## 2016-09-10 NOTE — ED Notes (Signed)
IV team at bedside 

## 2016-09-10 NOTE — ED Provider Notes (Signed)
[facility]ENCY DEPT Provider Note   CSN: [id number] Arrival date & time: 09/10/16  0755     History   Chief Complaint Chief Complaint  Patient presents with  . Vascular Access Problem    HPI Richard Shepherd is a 51 y.o. male.  HPI  Patient presents due to palpitations of PICC line. Patient has a history of endocarditis, has been receiving IV therapy for the past 10 days. He denies other acute changes, any fever, chills, chest pain, dyspnea. Today the patient went to prepare his morning infusion of IV antibiotics. There appeared to be fluid coming from around the PICC line itself, and the patient was referred here for evaluation.   Past Medical History:  Diagnosis Date  . Asthma   . DVT (deep venous thrombosis) (HCC)    a. mainly affect R leg  . Sarcoidosis    a. eye involvement only    Patient Active Problem List   Diagnosis Date Noted  . Chest pain 08/16/2013  . Pericarditis 08/16/2013  . LEG EDEMA 10/17/2007  . Sarcoidosis 06/22/2006  . DVT 06/22/2006  . Chronic kidney disease 06/22/2006    Past Surgical History:  Procedure Laterality Date  . CATARACT EXTRACTION  2012  . EYE SURGERY Bilateral 2015   scraped calcium from cornea  . GLAUCOMA SURGERY  2012       Home Medications    Prior to Admission medications   Medication Sig Start Date End Date Taking? Authorizing Provider  acetaminophen (TYLENOL) 500 MG tablet Take 1,000 mg by mouth 4 (four) times daily as needed for mild pain.   Yes [provider]  ALBUTEROL IN Inhale 2 puffs into the lungs as needed (wheezing).   Yes [provider]  atropine 1 % ophthalmic solution Place 1 drop into the left eye 2 (two) times daily.   Yes [provider]  bisacodyl (DULCOLAX) 5 MG EC tablet Take 5 mg by mouth 2 (two) times daily as needed for mild constipation or moderate constipation.   Yes [provider]  brimonidine (ALPHAGAN) 0.2 % ophthalmic solution Place 1 drop into  both eyes 3 (three) times daily.   Yes [provider]  calcium-vitamin D (OSCAL WITH D) 500-200 MG-UNIT per tablet Take 1 tablet by mouth daily with breakfast.   Yes [provider]  capsaicin (ZOSTRIX) 0.025 % cream Apply 1 application topically 2 (two) times daily.   Yes [provider]  carboxymethylcellulose (REFRESH PLUS) 0.5 % SOLN Place 1 drop into both eyes 2 (two) times daily.   Yes [provider]  colchicine 0.6 MG tablet Take 1 tablet (0.6 mg total) by mouth daily. 08/18/13  Yes Azalee Course, PA  cyclobenzaprine (FLEXERIL) 10 MG tablet Take 10 mg by mouth every 8 (eight) hours as needed for muscle spasms.   Yes [provider]  diphenhydrAMINE (BENADRYL) 50 MG capsule Take 50 mg by mouth at bedtime.   Yes [provider]  furosemide (LASIX) 20 MG tablet take 1 tablet by mouth once daily 11/21/15  Yes Lennette Bihari, MD  gabapentin (NEURONTIN) 600 MG tablet Take 1,200 mg by mouth at bedtime.     Yes [provider]  homatropine 2 % ophthalmic solution Place 2 drops into both eyes 2 (two) times daily.     Yes [provider]  HYDROcodone-acetaminophen (NORCO/VICODIN) 5-325 MG tablet Take 1-2 tablets by mouth every 4 (four) hours as needed. 07/28/16  Yes Elpidio Anis, PA-C  ketorolac (ACULAR) 0.5 % ophthalmic  solution Place 1 drop into both eyes daily.    Yes [provider]  mometasone (ASMANEX) 220 MCG/INH inhaler Inhale 2 puffs into the lungs daily.   Yes [provider]  mycophenolate (CELLCEPT) 500 MG tablet Take 500 mg by mouth 2 (two) times daily.    Yes [provider]  OVER THE COUNTER MEDICATION Apply 1 application topically 2 (two) times daily. Menthol/M-Salicylate 10-15% topical cream   Yes [provider]  prednisoLONE acetate (PRED FORTE) 1 % ophthalmic suspension Place 1 drop into both eyes every 4 (four) hours.    Yes [provider]  sildenafil (VIAGRA) 100 MG  tablet Take 50 mg by mouth daily as needed for erectile dysfunction.   Yes [provider]  timolol (TIMOPTIC-XR) 0.5 % ophthalmic gel-forming Place 1 drop into both eyes daily.   Yes [provider]  traMADol (ULTRAM) 50 MG tablet Take 50 mg by mouth 3 (three) times daily as needed for moderate pain. Maximum dose= 8 tablets per day For pain   Yes [provider]  valACYclovir (VALTREX) 1000 MG tablet Take 1,000 mg by mouth daily.   Yes [provider]  warfarin (COUMADIN) 5 MG tablet Take 2.5-5 mg by mouth daily. Take 2.5mg  by mouth on Saturdays, take 5mg  by mouth all other days   Yes [provider]  levofloxacin (LEVAQUIN) 750 MG tablet Take 1 tablet (750 mg total) by mouth daily. Patient not taking: Reported on 09/10/2016 07/23/15   Tomasita Crumble, MD  meloxicam (MOBIC) 7.5 MG tablet Take 1 tablet (7.5 mg total) by mouth daily. Patient not taking: Reported on 09/10/2016 06/14/16   [name], MD  methocarbamol (ROBAXIN) 500 MG tablet Take 1 tablet (500 mg total) by mouth 2 (two) times daily. Patient not taking: Reported on 09/10/2016 07/28/16   Elpidio Anis, PA-C  metoprolol tartrate (LOPRESSOR) 25 MG tablet Take 0.5 tablets (12.5 mg total) by mouth 2 (two) times daily. Patient not taking: Reported on 09/10/2016 12/21/14   Lennette Bihari, MD  omeprazole (PRILOSEC) 20 MG capsule Take 1 capsule (20 mg total) by mouth daily. Patient not taking: Reported on 09/10/2016 08/18/13   Azalee Course, PA    Family History Family History  Problem Relation Age of Onset  . Breast cancer Mother   . Multiple sclerosis Father   . Multiple sclerosis Brother   . Diabetes Brother     Social History Social History  Substance Use Topics  . Smoking status: Never Smoker  . Smokeless tobacco: Never Used  . Alcohol use Yes     Allergies   Patient has no known allergies.   Review of Systems Review of Systems  Constitutional:       Per HPI, otherwise negative  HENT:         Per HPI, otherwise negative  Respiratory:       Per HPI, otherwise negative  Cardiovascular:       Per HPI, otherwise negative  Gastrointestinal: Negative for vomiting.  Endocrine:       Negative aside from HPI  Genitourinary:       Neg aside from HPI   Musculoskeletal:       Per HPI, otherwise negative  Skin: Negative.   Allergic/Immunologic:       Sarcoidosis  Neurological: Negative for syncope.     Physical Exam Updated Vital Signs BP 127/68   Pulse 79   Temp 98.3 F (36.8 C) (Oral)   Resp (!) 21   Ht  5\' 11"  (1.803 m)   Wt 103.4 kg (228 lb)   SpO2 100%   BMI 31.80 kg/m   Physical Exam  Constitutional: He is oriented to person, place, and time. He appears well-developed. No distress.  HENT:  Head: Normocephalic and atraumatic.  Eyes: Conjunctivae and EOM are normal.  Cardiovascular: Normal rate and regular rhythm.   Murmur heard. Pulmonary/Chest: Effort normal. No stridor. No respiratory distress.  Abdominal: He exhibits no distension.  Musculoskeletal: He exhibits no edema.  Neurological: He is alert and oriented to person, place, and time.  Skin: Skin is warm and dry.  Left upper arm PICC line with blood around the exit site, sutures in place, no surrounding edema  Psychiatric: He has a normal mood and affect.  Nursing note and vitals reviewed.    ED Treatments / Results   Radiology Dg Chest Portable 1 View  Result Date: 09/10/2016 CLINICAL DATA:  51 year old male with a history of PICC malfunction EXAM: PORTABLE CHEST 1 VIEW COMPARISON:  08/16/2013 FINDINGS: Cardiomediastinal silhouette unchanged in size and contour. No pneumothorax or large pleural effusion. Compared to the prior plain film there is improved aeration. Fullness in the hilar regions is unchanged from the comparison plain films. No displaced fracture. Overlying EKG leads. No central venous catheter identified on the chest x-ray IMPRESSION: Improved aeration compared to prior plain  films, with no evidence of acute cardiopulmonary disease. There is no central venous catheter identified on the current chest x-ray. Electronically Signed   By: Gilmer Mor D.O.   On: 09/10/2016 10:34   Dg Humerus Left  Result Date: 09/10/2016 CLINICAL DATA:  Patient had PICC inserted at [facility] 1 week ago, having access problem. Check position. EXAM: LEFT HUMERUS - 2+ VIEW COMPARISON:  06/14/2016 FINDINGS: PICC line is identified, medial to the left humerus. Its course unremarkable. There is subacromial narrowing and large osteophyte along the inferior aspect of the humeral head, consistent with chronic rotator cuff injury. IMPRESSION: Unremarkable course of the PICC catheter. Changes consistent with chronic rotator cuff injury. Electronically Signed   By: Norva Pavlov M.D.   On: 09/10/2016 10:36    Procedures Procedures (including critical care time)    Initial Impression / Assessment and Plan / ED Course  I have reviewed the triage vital signs and the nursing notes.  Pertinent labs & imaging results that were available during my care of the patient were reviewed by me and considered in my medical decision making (see chart for details).  After the initial evaluation we contacted our IV PICC team for replacement, evaluation of the catheter. This was, but without complication, patient remained well, with no concerns, or complaints during his ED stay.   Final Clinical Impressions(s) / ED Diagnoses  PICC line complication   Gerhard Munch, MD 09/10/16 1145

## 2017-07-29 DIAGNOSIS — M7672 Peroneal tendinitis, left leg: Secondary | ICD-10-CM | POA: Insufficient documentation

## 2017-08-31 ENCOUNTER — Observation Stay (HOSPITAL_COMMUNITY)
Admission: EM | Admit: 2017-08-31 | Discharge: 2017-09-01 | Disposition: A | Discharge status: Home / Self Care | Payer: Medicare Other | Attending: Internal Medicine | Admitting: Internal Medicine

## 2017-08-31 ENCOUNTER — Encounter (HOSPITAL_COMMUNITY): Payer: Self-pay

## 2017-08-31 ENCOUNTER — Other Ambulatory Visit: Payer: Self-pay

## 2017-08-31 DIAGNOSIS — M1990 Unspecified osteoarthritis, unspecified site: Secondary | ICD-10-CM | POA: Diagnosis not present

## 2017-08-31 DIAGNOSIS — M109 Gout, unspecified: Secondary | ICD-10-CM | POA: Diagnosis not present

## 2017-08-31 DIAGNOSIS — Z9842 Cataract extraction status, left eye: Secondary | ICD-10-CM | POA: Insufficient documentation

## 2017-08-31 DIAGNOSIS — D631 Anemia in chronic kidney disease: Secondary | ICD-10-CM | POA: Insufficient documentation

## 2017-08-31 DIAGNOSIS — Z86718 Personal history of other venous thrombosis and embolism: Secondary | ICD-10-CM | POA: Insufficient documentation

## 2017-08-31 DIAGNOSIS — K91840 Postprocedural hemorrhage and hematoma of a digestive system organ or structure following a digestive system procedure: Principal | ICD-10-CM | POA: Insufficient documentation

## 2017-08-31 DIAGNOSIS — D869 Sarcoidosis, unspecified: Secondary | ICD-10-CM | POA: Diagnosis present

## 2017-08-31 DIAGNOSIS — M797 Fibromyalgia: Secondary | ICD-10-CM | POA: Diagnosis not present

## 2017-08-31 DIAGNOSIS — N183 Chronic kidney disease, stage 3 unspecified: Secondary | ICD-10-CM | POA: Diagnosis present

## 2017-08-31 DIAGNOSIS — K219 Gastro-esophageal reflux disease without esophagitis: Secondary | ICD-10-CM | POA: Insufficient documentation

## 2017-08-31 DIAGNOSIS — Z9841 Cataract extraction status, right eye: Secondary | ICD-10-CM | POA: Insufficient documentation

## 2017-08-31 DIAGNOSIS — N179 Acute kidney failure, unspecified: Secondary | ICD-10-CM | POA: Insufficient documentation

## 2017-08-31 DIAGNOSIS — Z9889 Other specified postprocedural states: Secondary | ICD-10-CM | POA: Diagnosis not present

## 2017-08-31 DIAGNOSIS — K1379 Other lesions of oral mucosa: Secondary | ICD-10-CM | POA: Diagnosis present

## 2017-08-31 DIAGNOSIS — J4520 Mild intermittent asthma, uncomplicated: Secondary | ICD-10-CM

## 2017-08-31 DIAGNOSIS — J45909 Unspecified asthma, uncomplicated: Secondary | ICD-10-CM | POA: Diagnosis not present

## 2017-08-31 DIAGNOSIS — Z7901 Long term (current) use of anticoagulants: Secondary | ICD-10-CM | POA: Diagnosis not present

## 2017-08-31 DIAGNOSIS — Z79899 Other long term (current) drug therapy: Secondary | ICD-10-CM | POA: Insufficient documentation

## 2017-08-31 DIAGNOSIS — I82409 Acute embolism and thrombosis of unspecified deep veins of unspecified lower extremity: Secondary | ICD-10-CM | POA: Diagnosis present

## 2017-08-31 DIAGNOSIS — Z8679 Personal history of other diseases of the circulatory system: Secondary | ICD-10-CM

## 2017-08-31 DIAGNOSIS — G4733 Obstructive sleep apnea (adult) (pediatric): Secondary | ICD-10-CM | POA: Insufficient documentation

## 2017-08-31 DIAGNOSIS — I493 Ventricular premature depolarization: Secondary | ICD-10-CM | POA: Insufficient documentation

## 2017-08-31 DIAGNOSIS — Z87891 Personal history of nicotine dependence: Secondary | ICD-10-CM | POA: Diagnosis not present

## 2017-08-31 HISTORY — DX: Cardiac murmur, unspecified: R01.1

## 2017-08-31 HISTORY — DX: Obstructive sleep apnea (adult) (pediatric): G47.33

## 2017-08-31 HISTORY — DX: Unspecified osteoarthritis, unspecified site: M19.90

## 2017-08-31 HISTORY — DX: Low back pain, unspecified: M54.50

## 2017-08-31 HISTORY — DX: Chronic kidney disease, stage 3 (moderate): N18.3

## 2017-08-31 HISTORY — DX: Endocarditis, valve unspecified: I38

## 2017-08-31 HISTORY — DX: Disease of pericardium, unspecified: I31.9

## 2017-08-31 HISTORY — DX: Fibromyalgia: M79.7

## 2017-08-31 HISTORY — DX: Chronic kidney disease, stage 3 unspecified: N18.30

## 2017-08-31 HISTORY — DX: Other chronic pain: G89.29

## 2017-08-31 HISTORY — DX: Dependence on other enabling machines and devices: Z99.89

## 2017-08-31 HISTORY — DX: Low back pain: M54.5

## 2017-08-31 LAB — CBC WITH DIFFERENTIAL/PLATELET
ABS IMMATURE GRANULOCYTES: 0 10*3/uL (ref 0.0–0.1)
Basophils Absolute: 0.1 10*3/uL (ref 0.0–0.1)
Basophils Relative: 1 %
Eosinophils Absolute: 0.3 10*3/uL (ref 0.0–0.7)
Eosinophils Relative: 7 %
HCT: 50.1 % (ref 39.0–52.0)
Hemoglobin: 16.8 g/dL (ref 13.0–17.0)
IMMATURE GRANULOCYTES: 1 %
LYMPHS ABS: 1.3 10*3/uL (ref 0.7–4.0)
Lymphocytes Relative: 25 %
MCH: 32.1 pg (ref 26.0–34.0)
MCHC: 33.5 g/dL (ref 30.0–36.0)
MCV: 95.8 fL (ref 78.0–100.0)
MONOS PCT: 10 %
Monocytes Absolute: 0.5 10*3/uL (ref 0.1–1.0)
NEUTROS ABS: 2.8 10*3/uL (ref 1.7–7.7)
NEUTROS PCT: 56 %
Platelets: 233 10*3/uL (ref 150–400)
RBC: 5.23 MIL/uL (ref 4.22–5.81)
RDW: 13.5 % (ref 11.5–15.5)
WBC: 5 10*3/uL (ref 4.0–10.5)

## 2017-08-31 LAB — BASIC METABOLIC PANEL
ANION GAP: 8 (ref 5–15)
BUN: 20 mg/dL (ref 6–20)
CHLORIDE: 107 mmol/L (ref 98–111)
CO2: 25 mmol/L (ref 22–32)
Calcium: 9.1 mg/dL (ref 8.9–10.3)
Creatinine, Ser: 1.77 mg/dL — ABNORMAL HIGH (ref 0.61–1.24)
GFR calc non Af Amer: 43 mL/min — ABNORMAL LOW (ref >60)
GFR, EST AFRICAN AMERICAN: 50 mL/min — AB (ref >60)
Glucose, Bld: 85 mg/dL (ref 70–99)
POTASSIUM: 4.3 mmol/L (ref 3.5–5.1)
Sodium: 140 mmol/L (ref 135–145)

## 2017-08-31 LAB — PROTIME-INR
INR: 2.68
Prothrombin Time: 28.3 seconds — ABNORMAL HIGH (ref 11.4–15.2)

## 2017-08-31 LAB — ABO/RH: ABO/RH(D): A POS

## 2017-08-31 MED ORDER — VITAMIN K1 10 MG/ML IJ SOLN
5.0000 mg | INTRAMUSCULAR | Status: AC
  Administered 2017-08-31: 5 mg via INTRAVENOUS
  Filled 2017-08-31: qty 0.5

## 2017-08-31 MED ORDER — FUROSEMIDE 20 MG PO TABS: 20.0000 mg | ORAL_TABLET | Freq: Every day | ORAL | Status: DC

## 2017-08-31 MED ORDER — DIPHENHYDRAMINE HCL 25 MG PO CAPS
50.0000 mg | ORAL_CAPSULE | Freq: Every day | ORAL | Status: DC
  Administered 2017-09-01: 50 mg via ORAL
  Filled 2017-08-31: qty 2

## 2017-08-31 MED ORDER — POLYETHYLENE GLYCOL 3350 17 G PO PACK: 17.0000 g | PACK | Freq: Every day | ORAL | Status: DC | PRN

## 2017-08-31 MED ORDER — ALBUTEROL SULFATE HFA 108 (90 BASE) MCG/ACT IN AERS: 1.0000 | INHALATION_SPRAY | Freq: Four times a day (QID) | RESPIRATORY_TRACT | Status: DC | PRN

## 2017-08-31 MED ORDER — AMOXICILLIN 500 MG PO CAPS
500.0000 mg | ORAL_CAPSULE | Freq: Three times a day (TID) | ORAL | Status: DC
  Administered 2017-09-01 (×3): 500 mg via ORAL
  Filled 2017-08-31 (×3): qty 1

## 2017-08-31 MED ORDER — BUDESONIDE 0.25 MG/2ML IN SUSP: 0.2500 mg | Freq: Two times a day (BID) | RESPIRATORY_TRACT | Status: DC | PRN

## 2017-08-31 MED ORDER — POLYVINYL ALCOHOL 1.4 % OP SOLN
1.0000 [drp] | Freq: Two times a day (BID) | OPHTHALMIC | Status: DC
  Administered 2017-09-01 (×2): 1 [drp] via OPHTHALMIC
  Filled 2017-08-31: qty 15

## 2017-08-31 MED ORDER — BISACODYL 5 MG PO TBEC: 5.0000 mg | DELAYED_RELEASE_TABLET | Freq: Two times a day (BID) | ORAL | Status: DC | PRN

## 2017-08-31 MED ORDER — ATROPINE SULFATE 1 % OP SOLN
1.0000 [drp] | Freq: Two times a day (BID) | OPHTHALMIC | Status: DC
  Administered 2017-09-01: 1 [drp] via OPHTHALMIC
  Filled 2017-08-31: qty 2

## 2017-08-31 MED ORDER — CYCLOBENZAPRINE HCL 10 MG PO TABS: 10.0000 mg | ORAL_TABLET | Freq: Three times a day (TID) | ORAL | Status: DC | PRN

## 2017-08-31 MED ORDER — HOMATROPINE HBR 2 % OP SOLN: 2.0000 [drp] | Freq: Two times a day (BID) | OPHTHALMIC | Status: DC

## 2017-08-31 MED ORDER — BRIMONIDINE TARTRATE 0.2 % OP SOLN
1.0000 [drp] | Freq: Two times a day (BID) | OPHTHALMIC | Status: DC
  Administered 2017-09-01 (×2): 1 [drp] via OPHTHALMIC
  Filled 2017-08-31: qty 5

## 2017-08-31 MED ORDER — MORPHINE SULFATE (PF) 4 MG/ML IV SOLN: 2.0000 mg | INTRAVENOUS | Status: DC | PRN

## 2017-08-31 MED ORDER — CAPSAICIN 0.025 % EX CREA
1.0000 "application " | TOPICAL_CREAM | Freq: Two times a day (BID) | CUTANEOUS | Status: DC | PRN
  Filled 2017-08-31: qty 60

## 2017-08-31 MED ORDER — COLCHICINE 0.6 MG PO TABS
0.6000 mg | ORAL_TABLET | Freq: Every day | ORAL | Status: DC
  Administered 2017-09-01: 0.6 mg via ORAL
  Filled 2017-08-31: qty 1

## 2017-08-31 MED ORDER — ACETAMINOPHEN 650 MG RE SUPP: 650.0000 mg | Freq: Four times a day (QID) | RECTAL | Status: DC | PRN

## 2017-08-31 MED ORDER — GABAPENTIN 600 MG PO TABS
1200.0000 mg | ORAL_TABLET | Freq: Every day | ORAL | Status: DC
  Administered 2017-09-01: 1200 mg via ORAL
  Filled 2017-08-31: qty 2

## 2017-08-31 MED ORDER — ACETAMINOPHEN 325 MG PO TABS: 650.0000 mg | ORAL_TABLET | Freq: Four times a day (QID) | ORAL | Status: DC | PRN

## 2017-08-31 MED ORDER — PREDNISOLONE ACETATE 1 % OP SUSP
1.0000 [drp] | Freq: Two times a day (BID) | OPHTHALMIC | Status: DC
  Administered 2017-09-01 (×2): 1 [drp] via OPHTHALMIC
  Filled 2017-08-31: qty 5

## 2017-08-31 MED ORDER — ONDANSETRON HCL 4 MG/2ML IJ SOLN: 4.0000 mg | Freq: Four times a day (QID) | INTRAMUSCULAR | Status: DC | PRN

## 2017-08-31 MED ORDER — HYDROCODONE-ACETAMINOPHEN 5-325 MG PO TABS
2.0000 | ORAL_TABLET | ORAL | Status: DC | PRN
Start: 2017-08-31 — End: 2017-09-01
  Administered 2017-09-01 (×2): 2 via ORAL
  Filled 2017-08-31 (×2): qty 2

## 2017-08-31 MED ORDER — MORPHINE SULFATE (PF) 4 MG/ML IV SOLN
4.0000 mg | Freq: Once | INTRAVENOUS | Status: AC
  Administered 2017-08-31: 4 mg via INTRAVENOUS
  Filled 2017-08-31: qty 1

## 2017-08-31 MED ORDER — MOMETASONE FUROATE 220 MCG/INH IN AEPB: 2.0000 | INHALATION_SPRAY | Freq: Every day | RESPIRATORY_TRACT | Status: DC | PRN

## 2017-08-31 MED ORDER — ALBUTEROL SULFATE (2.5 MG/3ML) 0.083% IN NEBU: 2.5000 mg | INHALATION_SOLUTION | Freq: Four times a day (QID) | RESPIRATORY_TRACT | Status: DC | PRN

## 2017-08-31 MED ORDER — ONDANSETRON HCL 4 MG PO TABS: 4.0000 mg | ORAL_TABLET | Freq: Four times a day (QID) | ORAL | Status: DC | PRN

## 2017-08-31 MED ORDER — VALACYCLOVIR HCL 500 MG PO TABS
1000.0000 mg | ORAL_TABLET | Freq: Every day | ORAL | Status: DC
  Administered 2017-09-01: 1000 mg via ORAL
  Filled 2017-08-31: qty 2

## 2017-08-31 MED ORDER — TIMOLOL MALEATE 0.5 % OP SOLG
1.0000 [drp] | Freq: Every day | OPHTHALMIC | Status: DC
  Administered 2017-09-01: 1 [drp] via OPHTHALMIC
  Filled 2017-08-31: qty 5

## 2017-08-31 MED ORDER — CALCIUM CARBONATE-VITAMIN D 500-200 MG-UNIT PO TABS
1.0000 | ORAL_TABLET | Freq: Every day | ORAL | Status: DC
  Administered 2017-09-01: 1 via ORAL
  Filled 2017-08-31 (×2): qty 1

## 2017-08-31 MED ORDER — MYCOPHENOLATE MOFETIL 250 MG PO CAPS
500.0000 mg | ORAL_CAPSULE | Freq: Two times a day (BID) | ORAL | Status: DC
  Administered 2017-09-01 (×2): 500 mg via ORAL
  Filled 2017-08-31 (×2): qty 2

## 2017-08-31 MED ORDER — SODIUM CHLORIDE 0.9 % IV SOLN
10.0000 mL/h | Freq: Once | INTRAVENOUS | Status: AC
  Administered 2017-08-31: 10 mL/h via INTRAVENOUS

## 2017-08-31 MED ORDER — KETOROLAC TROMETHAMINE 0.5 % OP SOLN
1.0000 [drp] | Freq: Two times a day (BID) | OPHTHALMIC | Status: DC
  Administered 2017-09-01 (×2): 1 [drp] via OPHTHALMIC
  Filled 2017-08-31: qty 5

## 2017-08-31 NOTE — Progress Notes (Signed)
Patient arrived from the ED. A&O. IV running 1 unit FFP. CHG bath completed. Patient placed on monitor. Oriented to unit. Wife at bedside. VSS. Will continue to monitor. Victorino December, RN

## 2017-08-31 NOTE — H&P (Signed)
History and Physical    Richard Shepherd:[id number] DOB: Jun 15, 1965 DOA: 08/31/2017  Referring MD/NP/PA:   PCP: [facility]   Patient coming from:  The patient is coming from home.  At baseline, pt is independent for most of ADL.    Chief Complaint: bleeding from mouth  HPI: Richard Shepherd is a 52 y.o. male with medical history significant of endocarditis, pericarditis, sarcoidosis on CellCept, CKD 3, DVT on Coumadin, GERD, asthma, gout, who presents with bleeding from the mouth.  Patient states that had one right lower molar tooth pulled out by his dentist today about 230 pm. He is on blood thinners, Coumadin. He was not asked to stop his blood thinners prior to today. Pt has been bleeding from his mouth after the procedure.  Patient does not have chest pain, shortness of breath, lightheadedness or dizziness.  Denies cough, fever or chills.  No nausea, vomiting, diarrhea, abdominal pain, symptoms of UTI.  No hematuria, hematochezia. Pt states that he was given amoxicillin prescription for 7 days due to history of endocarditis.   ED Course: pt was found to have hemoglobin 16.8, INR 2.68, WBC 5.0, worsening renal function, temperature normal, no tachycardia, no tachypnea, oxygen saturation 98% on room air.  Patient is placed on telemetry bed of observation.  Review of Systems:   General: no fevers, chills, no body weight gain. HEENT: no blurry vision, hearing changes or sore throat. Has mouth bleeding. Respiratory: no dyspnea, coughing, wheezing CV: no chest pain, no palpitations GI: no nausea, vomiting, abdominal pain, diarrhea, constipation GU: no dysuria, burning on urination, increased urinary frequency, hematuria  Ext: has trace leg edema Neuro: no unilateral weakness, numbness, or tingling, no vision change or hearing loss Skin: no rash, no skin tear. MSK: No muscle spasm, no deformity, no limitation of range of movement in spin Heme: No easy bruising.  Travel  history: No recent long distant travel.  Allergy: No Known Allergies  Past Medical History:  Diagnosis Date  . Arthritis    "all over" (08/31/2017)  . Asthma   . Chronic lower back pain   . CKD (chronic kidney disease), stage III (HCC)    [name] 08/31/2017  . DVT (deep venous thrombosis) (HCC) < & 06/2006   a. mainly affect R leg  . Endocarditis 2018  . Fibromyalgia   . Heart murmur   . OSA on CPAP   . Pericarditis 2017  . Sarcoidosis    a. eye involvement only    Past Surgical History:  Procedure Laterality Date  . CATARACT EXTRACTION W/ INTRAOCULAR LENS  IMPLANT, BILATERAL Bilateral 2012  . EYE SURGERY Bilateral 2015   scraped calcium from cornea  . GLAUCOMA SURGERY Bilateral 2012    Social History:  reports that he has quit smoking. His smoking use included cigarettes. He has a 0.48 pack-year smoking history. He has never used smokeless tobacco. He reports that he drinks alcohol. He reports that he does not use drugs.  Family History:  Family History  Problem Relation Age of Onset  . Breast cancer Mother   . Multiple sclerosis Father   . Multiple sclerosis Brother   . Diabetes Brother      Prior to Admission medications   Medication Sig Start Date End Date Taking? Authorizing Provider  acetaminophen (TYLENOL) 500 MG tablet Take 1,000 mg by mouth 4 (four) times daily as needed for mild pain.   Yes [provider]  albuterol (PROVENTIL HFA;VENTOLIN HFA) 108 (90 Base) MCG/ACT inhaler Inhale 1-2  puffs into the lungs every 6 (six) hours as needed for wheezing or shortness of breath.   Yes [provider]  atropine 1 % ophthalmic solution Place 1 drop into the left eye 2 (two) times daily.   Yes [provider]  bisacodyl (DULCOLAX) 5 MG EC tablet Take 5 mg by mouth 2 (two) times daily as needed for mild constipation or moderate constipation.   Yes [provider]  brimonidine (ALPHAGAN) 0.2 % ophthalmic solution Place 1 drop into both  eyes 2 (two) times daily.    Yes [provider]  calcium-vitamin D (OSCAL WITH D) 500-200 MG-UNIT per tablet Take 1 tablet by mouth daily with breakfast.   Yes [provider]  capsaicin (ZOSTRIX) 0.025 % cream Apply 1 application topically 2 (two) times daily as needed (skin care).    Yes [provider]  carboxymethylcellulose (REFRESH PLUS) 0.5 % SOLN Place 1 drop into both eyes 2 (two) times daily.   Yes [provider]  cyclobenzaprine (FLEXERIL) 10 MG tablet Take 10 mg by mouth every 8 (eight) hours as needed for muscle spasms.   Yes [provider]  diphenhydrAMINE (BENADRYL) 50 MG capsule Take 50 mg by mouth at bedtime.   Yes [provider]  furosemide (LASIX) 20 MG tablet take 1 tablet by mouth once daily 11/21/15  Yes Lennette Bihari, MD  gabapentin (NEURONTIN) 600 MG tablet Take 1,200 mg by mouth at bedtime.     Yes [provider]  homatropine 2 % ophthalmic solution Place 2 drops into both eyes 2 (two) times daily.     Yes [provider]  HYDROcodone-acetaminophen (NORCO/VICODIN) 5-325 MG tablet Take 1-2 tablets by mouth every 4 (four) hours as needed. 07/28/16  Yes Upstill, Melvenia Beam, PA-C  ketorolac (ACULAR) 0.5 % ophthalmic solution Place 1 drop into both eyes 2 (two) times daily.    Yes [provider]  mometasone (ASMANEX) 220 MCG/INH inhaler Inhale 2 puffs into the lungs daily as needed (SIB).    Yes [provider]  mycophenolate (CELLCEPT) 500 MG tablet Take 600 mg by mouth 2 (two) times daily.    Yes [provider]  OVER THE COUNTER MEDICATION Apply 1 application topically 2 (two) times daily as needed (pain). Menthol/M-Salicylate 10-15% topical cream    Yes [provider]  prednisoLONE acetate (PRED FORTE) 1 % ophthalmic suspension Place 1 drop into both eyes 2 (two) times daily.    Yes [provider]  sildenafil (VIAGRA) 100 MG tablet Take 50 mg by mouth daily as  needed for erectile dysfunction.   Yes [provider]  timolol (TIMOPTIC-XR) 0.5 % ophthalmic gel-forming Place 1 drop into both eyes daily.   Yes [provider]  traMADol (ULTRAM) 50 MG tablet Take 50 mg by mouth 3 (three) times daily as needed for moderate pain. Maximum dose= 8 tablets per day For pain   Yes [provider]  valACYclovir (VALTREX) 1000 MG tablet Take 1,000 mg by mouth daily.   Yes [provider]  warfarin (COUMADIN) 5 MG tablet Take 2.5-5 mg by mouth daily. Take 2.5mg  by mouth on Saturdays, take 5mg  by mouth all other days   Yes [provider]  colchicine 0.6 MG tablet Take 1 tablet (0.6 mg total) by mouth daily. Patient not taking: Reported on 08/31/2017 08/18/13   Azalee Course, PA  levofloxacin (LEVAQUIN) 750 MG tablet Take 1 tablet (750 mg total) by mouth daily. Patient not taking: Reported on  09/10/2016 07/23/15   Tomasita Crumble, MD  meloxicam (MOBIC) 7.5 MG tablet Take 1 tablet (7.5 mg total) by mouth daily. Patient not taking: Reported on 09/10/2016 06/14/16   Phillis Haggis, MD  methocarbamol (ROBAXIN) 500 MG tablet Take 1 tablet (500 mg total) by mouth 2 (two) times daily. Patient not taking: Reported on 09/10/2016 07/28/16   Elpidio Anis, PA-C  metoprolol tartrate (LOPRESSOR) 25 MG tablet Take 0.5 tablets (12.5 mg total) by mouth 2 (two) times daily. Patient not taking: Reported on 09/10/2016 12/21/14   Lennette Bihari, MD  omeprazole (PRILOSEC) 20 MG capsule Take 1 capsule (20 mg total) by mouth daily. Patient not taking: Reported on 09/10/2016 08/18/13   Azalee Course, [place]    Physical Exam: Vitals:   08/31/17 2300 08/31/17 2315 08/31/17 2348 09/01/17 0030  BP: (!) 143/91 128/84 (!) 150/99 (!) 141/90  Pulse: 76 79 78 81  Resp: (!) 23 14 12  (!) 24  Temp:  97.8 F (36.6 C)  98.2 F (36.8 C)  TempSrc:  Oral  Oral  SpO2: 97% 98% 97% 97%  Weight:  109.9 kg (242 lb 3.2 oz)    Height:  5\' 10"  (1.778 m)     General: Not in acute  distress HEENT:       Eyes: PERRL, EOMI, no scleral icterus.       ENT: No discharge from the ears and nose, no pharynx injection, no tonsillar enlargement.        Neck: No JVD, no bruit, no mass felt. Heme: No neck lymph node enlargement. Cardiac: S1/S2, RRR, No murmurs, No gallops or rubs. Respiratory:  No rales, wheezing, rhonchi or rubs. GI: Soft, nondistended, nontender, no rebound pain, no organomegaly, BS present. GU: No hematuria Ext: has trace leg edema bilaterally. 2+DP/PT pulse bilaterally. Musculoskeletal: No joint deformities, No joint redness or warmth, no limitation of ROM in spin. Skin: No rashes.  Neuro: Alert, oriented X3, cranial nerves II-XII grossly intact, moves all extremities normally.  Psych: Patient is not psychotic, no suicidal or hemocidal ideation.  Labs on Admission: I have personally reviewed following labs and imaging studies  CBC: Recent Labs  Lab 08/31/17 1811 09/01/17 0021  WBC 5.0 4.9  NEUTROABS 2.8  --   HGB 16.8 16.0  HCT 50.1 47.6  MCV 95.8 95.6  PLT 233 207   Basic Metabolic Panel: Recent Labs  Lab 08/31/17 1811 09/01/17 0021  NA 140 142  K 4.3 4.0  CL 107 107  CO2 25 25  GLUCOSE 85 81  BUN 20 19  CREATININE 1.77* 1.65*  CALCIUM 9.1 8.8*   GFR: Estimated Creatinine Clearance: 65.8 mL/min (A) (by C-G formula based on SCr of 1.65 mg/dL (H)). Liver Function Tests: No results for input(s): AST, ALT, ALKPHOS, BILITOT, PROT, ALBUMIN in the last 168 hours. No results for input(s): LIPASE, AMYLASE in the last 168 hours. No results for input(s): AMMONIA in the last 168 hours. Coagulation Profile: Recent Labs  Lab 08/31/17 1811 09/01/17 0021  INR 2.68 2.08   Cardiac Enzymes: No results for input(s): CKTOTAL, CKMB, CKMBINDEX, TROPONINI in the last 168 hours. BNP (last 3 results) No results for input(s): PROBNP in the last 8760 hours. HbA1C: No results for input(s): HGBA1C in the last 72 hours. CBG: No results for input(s):  GLUCAP in the last 168 hours. Lipid Profile: No results for input(s): CHOL, HDL, LDLCALC, TRIG, CHOLHDL, LDLDIRECT in the last 72 hours. Thyroid Function Tests: No results for input(s): TSH, T4TOTAL, FREET4, T3FREE,  THYROIDAB in the last 72 hours. Anemia Panel: No results for input(s): VITAMINB12, FOLATE, FERRITIN, TIBC, IRON, RETICCTPCT in the last 72 hours. Urine analysis:    Component Value Date/Time   COLORURINE RED (A) 07/23/2015 0220   APPEARANCEUR TURBID (A) 07/23/2015 0220   LABSPEC 1.019 07/23/2015 0220   PHURINE 5.5 07/23/2015 0220   GLUCOSEU NEGATIVE 07/23/2015 0220   GLUCOSEU NEG mg/dL 12/31/8525 7824   HGBUR LARGE (A) 07/23/2015 0220   BILIRUBINUR LARGE (A) 07/23/2015 0220   KETONESUR 40 (A) 07/23/2015 0220   PROTEINUR >300 (A) 07/23/2015 0220   UROBILINOGEN 0.2 11/10/2008 0222   NITRITE POSITIVE (A) 07/23/2015 0220   LEUKOCYTESUR LARGE (A) 07/23/2015 0220   Sepsis Labs: @LABRCNTIP (procalcitonin:4,lacticidven:4) )No results found for this or any previous visit (from the past 240 hour(s)).   Radiological Exams on Admission: No results found.   EKG:  Not done in ED, will get one.   Assessment/Plan Principal Problem:   Bleeding from mouth Active Problems:   Sarcoidosis   DVT (deep venous thrombosis) (HCC)   History of endocarditis   Asthma   GERD (gastroesophageal reflux disease)   Acute renal failure superimposed on stage 3 chronic kidney disease (HCC)   Bleeding from mouth: pt continues to have mouth bleeding after the procedure, but hemodynamically stable.  Hemoglobin stable 16.8. -Placed on telemetry bed of observation -Patient was given FFP transfusion and 5 mg of vitamin K by ED physician -f/u CBC in AM -repeat INR in AM -hold coumadin  DVT: pt has hx of DVT twice in the past. On Coumadin with IRN 2.68. No CP or SOB. No signs of PE. -hold coumadin now  Sarcoidosis: stable. No cough or SOB -continue cellcept  History of endocarditis: -will  let pt take his home amoxicillin for prophylaxis   Asthma: stable. -prn albuterol nebs -ASmanex inhaler prn  GERD: -Protonix  AoCKD-III: Baseline Cre is 1.48, pt's Cre is 1.77 and BUN 20 on admission. Likely due to prerenal secondary to dehydration and continuation of diuretics and mobic -NS at 75 cc/h for 6 hours - Follow up renal function by BMP - Hold lasix and mobic  DVT ppx: None (INR 2.68, has active bleeding, cannot use SCD due to hx of DVT). Code Status: Full code Family Communication:  Yes, patient's wife at bed side Disposition Plan:  Anticipate discharge back to previous home environment Consults called:  none Admission status: Obs / tele     Date of Service 09/01/2017    Lorretta Harp [facility] Pager [phone]  If 7PM-7AM, please contact night-coverage [email or web address] Password [facility] 09/01/2017, 2:35 AM

## 2017-08-31 NOTE — ED Provider Notes (Addendum)
[facility] EMERGENCY DEPARTMENT Provider Note   CSN: [id number] Arrival date & time: 08/31/17  1725     History   Chief Complaint Chief Complaint  Patient presents with  . Dental Injury    HPI Richard Shepherd is a 52 y.o. male.  HPI Patient presented to the emergency room for evaluation of bleeding following a tooth extraction.  Pt was not taken off of his coumadin prior to the extraction. Patient has a history of DVT and takes Coumadin.  His last DVT was several years ago.  Patient does not have valvular heart disease.  Patient states since his tooth extraction he has had continued his bleeding.  He has not been able to get it stop so he came to the emergency room.  He denies any lightheadedness or weakness.  No chest pain or shortness of breath. Past Medical History:  Diagnosis Date  . Asthma   . DVT (deep venous thrombosis) (HCC)    a. mainly affect R leg  . Endocarditis   . Sarcoidosis    a. eye involvement only    Patient Active Problem List   Diagnosis Date Noted  . Chest pain 08/16/2013  . Pericarditis 08/16/2013  . LEG EDEMA 10/17/2007  . Sarcoidosis 06/22/2006  . DVT 06/22/2006  . Chronic kidney disease 06/22/2006    Past Surgical History:  Procedure Laterality Date  . CATARACT EXTRACTION  2012  . EYE SURGERY Bilateral 2015   scraped calcium from cornea  . GLAUCOMA SURGERY  2012        Home Medications    Prior to Admission medications   Medication Sig Start Date End Date Taking? Authorizing Provider  acetaminophen (TYLENOL) 500 MG tablet Take 1,000 mg by mouth 4 (four) times daily as needed for mild pain.   Yes [provider]  albuterol (PROVENTIL HFA;VENTOLIN HFA) 108 (90 Base) MCG/ACT inhaler Inhale 1-2 puffs into the lungs every 6 (six) hours as needed for wheezing or shortness of breath.   Yes [provider]  atropine 1 % ophthalmic solution Place 1 drop into the left eye 2 (two) times daily.   Yes  [provider]  bisacodyl (DULCOLAX) 5 MG EC tablet Take 5 mg by mouth 2 (two) times daily as needed for mild constipation or moderate constipation.   Yes [provider]  brimonidine (ALPHAGAN) 0.2 % ophthalmic solution Place 1 drop into both eyes 2 (two) times daily.    Yes [provider]  calcium-vitamin D (OSCAL WITH D) 500-200 MG-UNIT per tablet Take 1 tablet by mouth daily with breakfast.   Yes [provider]  capsaicin (ZOSTRIX) 0.025 % cream Apply 1 application topically 2 (two) times daily as needed (skin care).    Yes [provider]  carboxymethylcellulose (REFRESH PLUS) 0.5 % SOLN Place 1 drop into both eyes 2 (two) times daily.   Yes [provider]  cyclobenzaprine (FLEXERIL) 10 MG tablet Take 10 mg by mouth every 8 (eight) hours as needed for muscle spasms.   Yes [provider]  diphenhydrAMINE (BENADRYL) 50 MG capsule Take 50 mg by mouth at bedtime.   Yes [provider]  furosemide (LASIX) 20 MG tablet take 1 tablet by mouth once daily 11/21/15  Yes Lennette Bihari, MD  gabapentin (NEURONTIN) 600 MG tablet Take 1,200 mg by mouth at bedtime.     Yes [provider]  homatropine 2 % ophthalmic solution Place 2 drops into both eyes 2 (two) times daily.  Yes [provider]  HYDROcodone-acetaminophen (NORCO/VICODIN) 5-325 MG tablet Take 1-2 tablets by mouth every 4 (four) hours as needed. 07/28/16  Yes Upstill, Melvenia Beam, PA-C  ketorolac (ACULAR) 0.5 % ophthalmic solution Place 1 drop into both eyes 2 (two) times daily.    Yes [provider]  mometasone (ASMANEX) 220 MCG/INH inhaler Inhale 2 puffs into the lungs daily as needed (SIB).    Yes [provider]  mycophenolate (CELLCEPT) 500 MG tablet Take 600 mg by mouth 2 (two) times daily.    Yes [provider]  OVER THE COUNTER MEDICATION Apply 1 application topically 2 (two) times daily as needed (pain).  Menthol/M-Salicylate 10-15% topical cream    Yes [provider]  prednisoLONE acetate (PRED FORTE) 1 % ophthalmic suspension Place 1 drop into both eyes 2 (two) times daily.    Yes [provider]  sildenafil (VIAGRA) 100 MG tablet Take 50 mg by mouth daily as needed for erectile dysfunction.   Yes [provider]  timolol (TIMOPTIC-XR) 0.5 % ophthalmic gel-forming Place 1 drop into both eyes daily.   Yes [provider]  traMADol (ULTRAM) 50 MG tablet Take 50 mg by mouth 3 (three) times daily as needed for moderate pain. Maximum dose= 8 tablets per day For pain   Yes [provider]  valACYclovir (VALTREX) 1000 MG tablet Take 1,000 mg by mouth daily.   Yes [provider]  warfarin (COUMADIN) 5 MG tablet Take 2.5-5 mg by mouth daily. Take 2.5mg  by mouth on Saturdays, take 5mg  by mouth all other days   Yes [provider]  colchicine 0.6 MG tablet Take 1 tablet (0.6 mg total) by mouth daily. Patient not taking: Reported on 08/31/2017 08/18/13   Azalee Course, PA  levofloxacin (LEVAQUIN) 750 MG tablet Take 1 tablet (750 mg total) by mouth daily. Patient not taking: Reported on 09/10/2016 07/23/15   Tomasita Crumble, MD  meloxicam (MOBIC) 7.5 MG tablet Take 1 tablet (7.5 mg total) by mouth daily. Patient not taking: Reported on 09/10/2016 06/14/16   Phillis Haggis, MD  methocarbamol (ROBAXIN) 500 MG tablet Take 1 tablet (500 mg total) by mouth 2 (two) times daily. Patient not taking: Reported on 09/10/2016 07/28/16   Elpidio Anis, PA-C  metoprolol tartrate (LOPRESSOR) 25 MG tablet Take 0.5 tablets (12.5 mg total) by mouth 2 (two) times daily. Patient not taking: Reported on 09/10/2016 12/21/14   Lennette Bihari, MD  omeprazole (PRILOSEC) 20 MG capsule Take 1 capsule (20 mg total) by mouth daily. Patient not taking: Reported on 09/10/2016 08/18/13   Azalee Course, PA    Family History Family History  Problem Relation Age of Onset  . Breast cancer Mother   .  Multiple sclerosis Father   . Multiple sclerosis Brother   . Diabetes Brother     Social History Social History   Tobacco Use  . Smoking status: Never Smoker  . Smokeless tobacco: Never Used  Substance Use Topics  . Alcohol use: Yes  . Drug use: No     Allergies   Patient has no known allergies.   Review of Systems Review of Systems  All other systems reviewed and are negative.    Physical Exam Updated Vital Signs BP (!) 167/95   Pulse 79   Temp 97.9 F (36.6 C) (Oral)   Resp 18   Ht 1.778 m (5\' 10" )   Wt 106.1 kg (234 lb)   SpO2 98%   BMI 33.58 kg/m  Physical Exam  Constitutional: He appears well-developed and well-nourished. No distress.  HENT:  Head: Normocephalic and atraumatic.  Right Ear: External ear normal.  Left Ear: External ear normal.  Status post dental extraction in the right molar region, bright red blood noted in the socket  Eyes: Conjunctivae are normal. Right eye exhibits no discharge. Left eye exhibits no discharge. No scleral icterus.  Neck: Neck supple. No tracheal deviation present.  Cardiovascular: Normal rate, regular rhythm and intact distal pulses.  Pulmonary/Chest: Effort normal and breath sounds normal. No stridor. No respiratory distress. He has no wheezes. He has no rales.  Abdominal: Soft. Bowel sounds are normal. He exhibits no distension. There is no tenderness. There is no rebound and no guarding.  Musculoskeletal: He exhibits no edema or tenderness.  Neurological: He is alert. He has normal strength. No cranial nerve deficit (no facial droop, extraocular movements intact, no slurred speech) or sensory deficit. He exhibits normal muscle tone. He displays no seizure activity. Coordination normal.  Skin: Skin is warm and dry. No rash noted.  Psychiatric: He has a normal mood and affect.  Nursing note and vitals reviewed.    ED Treatments / Results  Labs (all labs ordered are listed, but only abnormal results are  displayed) Labs Reviewed  PROTIME-INR - Abnormal; Notable for the following components:      Result Value   Prothrombin Time 28.3 (*)    All other components within normal limits  BASIC METABOLIC PANEL - Abnormal; Notable for the following components:   Creatinine, Ser 1.77 (*)    GFR calc non Af Amer 43 (*)    GFR calc Af Amer 50 (*)    All other components within normal limits  CBC WITH DIFFERENTIAL/PLATELET  PREPARE FRESH FROZEN PLASMA  ABO/RH    EKG None  Radiology No results found.  Procedures .Critical Care Performed by: Linwood Dibbles, MD Authorized by: Linwood Dibbles, MD   Critical care provider statement:    Critical care time (minutes):  30   Critical care was time spent personally by me on the following activities:  Discussions with consultants, evaluation of patient's response to treatment, examination of patient, ordering and performing treatments and interventions, ordering and review of laboratory studies, ordering and review of radiographic studies, pulse oximetry, re-evaluation of patient's condition, obtaining history from patient or surrogate and review of old charts   (including critical care time)  Medications Ordered in ED Medications  0.9 %  sodium chloride infusion (has no administration in time range)  phytonadione (VITAMIN K) 5 mg in dextrose 5 % 50 mL IVPB (5 mg Intravenous New Bag/Given 08/31/17 2044)     Initial Impression / Assessment and Plan / ED Course  I have reviewed the triage vital signs and the nursing notes.  Pertinent labs & imaging results that were available during my care of the patient were reviewed by me and considered in my medical decision making (see chart for details).  Clinical Course as of Aug 31 2200  Tue Aug 31, 2017  2135 Patient continues to bleed despite being given vitamin K earlier.  I think at this point he needs FFP to help correct the bleeding.   [JK]    Clinical Course User Index [JK] Linwood Dibbles, MD  Patient's  labs are stable.  No signs of anemia.  INR is in the therapeutic range.  Because of his persistent bleeding and the fact that he is on Coumadin for remote DVT we will plan on reversing  the patient's Coumadin.  I will start with vitamin K since he is hemodynamically stable.  We will continue to monitor the emergency room and see if the bleeding slows down.  We may need to consider FFP if it continues  Patient continues to have bleeding.  I have ordered FFP.  I will consult with medical service to bring him in for observation to monitor bleeding his bleeding and the response to the FFP  Final Clinical Impressions(s) / ED Diagnoses   Final diagnoses:  Oral bleeding      Linwood Dibbles, MD 08/31/17 2139    Linwood Dibbles, MD 08/31/17 2202

## 2017-08-31 NOTE — ED Triage Notes (Signed)
Pt states that he had his tooth pulled today about 230 pm. He is on blood thinners and was not asked to stop his blood thinners prior to today. Pt has been bleeding from his mouth since.

## 2017-08-31 NOTE — ED Provider Notes (Signed)
Patient placed in Quick Look pathway, seen and evaluated   Chief Complaint: Bleeding  HPI:   Patient had a tooth pulled today.  He is on warfarin for multiple DVT, and did not stop his blood thinners prior to surgery.  He has taken his antibiotics before the extraction as he has a history of endocarditis.    ROS: No vomiting.   Physical Exam:   Gen: No distress  Neuro: Awake and Alert  Skin: Warm    Focused Exam: There is brisk oozing from the dental extraction site on right lower jaw   Initiation of care has begun. The patient has been counseled on the process, plan, and necessity for staying for the completion/evaluation, and the remainder of the medical screening examination    Norman Clay 08/31/17 1818    Terrilee Files, MD 09/01/17 1126

## 2017-08-31 NOTE — Progress Notes (Signed)
Received report from ED. Victorino December, RN

## 2017-09-01 ENCOUNTER — Encounter (HOSPITAL_COMMUNITY): Payer: Self-pay | Admitting: General Practice

## 2017-09-01 DIAGNOSIS — N179 Acute kidney failure, unspecified: Secondary | ICD-10-CM

## 2017-09-01 DIAGNOSIS — N183 Chronic kidney disease, stage 3 (moderate): Secondary | ICD-10-CM | POA: Diagnosis not present

## 2017-09-01 DIAGNOSIS — D869 Sarcoidosis, unspecified: Secondary | ICD-10-CM | POA: Diagnosis not present

## 2017-09-01 DIAGNOSIS — K91840 Postprocedural hemorrhage and hematoma of a digestive system organ or structure following a digestive system procedure: Secondary | ICD-10-CM | POA: Diagnosis not present

## 2017-09-01 DIAGNOSIS — K1379 Other lesions of oral mucosa: Secondary | ICD-10-CM | POA: Diagnosis not present

## 2017-09-01 LAB — HIV ANTIBODY (ROUTINE TESTING W REFLEX): HIV Screen 4th Generation wRfx: NONREACTIVE

## 2017-09-01 LAB — BRAIN NATRIURETIC PEPTIDE: B Natriuretic Peptide: 87.7 pg/mL (ref 0.0–100.0)

## 2017-09-01 LAB — CBC
HEMATOCRIT: 47.6 % (ref 39.0–52.0)
Hemoglobin: 16 g/dL (ref 13.0–17.0)
MCH: 32.1 pg (ref 26.0–34.0)
MCHC: 33.6 g/dL (ref 30.0–36.0)
MCV: 95.6 fL (ref 78.0–100.0)
Platelets: 207 10*3/uL (ref 150–400)
RBC: 4.98 MIL/uL (ref 4.22–5.81)
RDW: 13.6 % (ref 11.5–15.5)
WBC: 4.9 10*3/uL (ref 4.0–10.5)

## 2017-09-01 LAB — BPAM FFP
BLOOD PRODUCT EXPIRATION DATE: 201906302359
ISSUE DATE / TIME: 201906252209
UNIT TYPE AND RH: 600

## 2017-09-01 LAB — BASIC METABOLIC PANEL
ANION GAP: 10 (ref 5–15)
BUN: 19 mg/dL (ref 6–20)
CO2: 25 mmol/L (ref 22–32)
Calcium: 8.8 mg/dL — ABNORMAL LOW (ref 8.9–10.3)
Chloride: 107 mmol/L (ref 98–111)
Creatinine, Ser: 1.65 mg/dL — ABNORMAL HIGH (ref 0.61–1.24)
GFR calc Af Amer: 54 mL/min — ABNORMAL LOW (ref >60)
GFR calc non Af Amer: 47 mL/min — ABNORMAL LOW (ref >60)
GLUCOSE: 81 mg/dL (ref 70–99)
POTASSIUM: 4 mmol/L (ref 3.5–5.1)
Sodium: 142 mmol/L (ref 135–145)

## 2017-09-01 LAB — PREPARE FRESH FROZEN PLASMA: UNIT DIVISION: 0

## 2017-09-01 LAB — TYPE AND SCREEN
ABO/RH(D): A POS
ANTIBODY SCREEN: NEGATIVE

## 2017-09-01 LAB — PROTIME-INR
INR: 2.08
Prothrombin Time: 23.2 seconds — ABNORMAL HIGH (ref 11.4–15.2)

## 2017-09-01 MED ORDER — SODIUM CHLORIDE 0.9 % IV SOLN
INTRAVENOUS | Status: AC
  Administered 2017-09-01: 03:00:00 via INTRAVENOUS

## 2017-09-01 MED ORDER — WARFARIN SODIUM 5 MG PO TABS: 2.5000 mg | ORAL_TABLET | Freq: Every day | ORAL | 0 refills | Status: DC

## 2017-09-01 MED ORDER — HYDRALAZINE HCL 20 MG/ML IJ SOLN
5.0000 mg | INTRAMUSCULAR | Status: DC | PRN
Start: 2017-09-01 — End: 2017-09-01

## 2017-09-01 NOTE — Progress Notes (Signed)
Patient in a stable condition, discharge education reviewed with patient he verbalized understanding, iv removed, tele dc ccmd notified, patient belongings at bedside, patient awaiting his wife for transportation home 

## 2017-09-01 NOTE — Discharge Summary (Signed)
Physician Discharge Summary  Richard Shepherd ZOX:[id number] DOB: 1965-05-27 DOA: 08/31/2017  PCP: [facility]  Admit date: 08/31/2017 Discharge date: 09/01/2017  Admitted From: Home Disposition: Home  Recommendations for Outpatient Follow-up:  1. Follow up with PCP in 1-2 weeks.  Patient may resume Coumadin from tomorrow if no further mouth bleed.  Patient to follow-up with his dentist this afternoon following discharge.   Home Health: None Equipment/Devices: None   Discharge Condition: Fair CODE STATUS: Full code Diet recommendation: Regular   Discharge Diagnoses:  Principal Problem:   Bleeding from mouth  Active Problems:   Sarcoidosis   DVT (deep venous thrombosis) (HCC)   History of endocarditis   Asthma   GERD (gastroesophageal reflux disease)   Acute renal failure superimposed on stage 3 chronic kidney disease (HCC)  Brief narrative/HPI Please refer to admission H&P for details, in brief,52 y.o. male with medical history significant of endocarditis, pericarditis, sarcoidosis on CellCept, CKD 3, DVT on Coumadin, GERD, asthma, gout, who presents with bleeding from the mouth.  Patient states that had one right lower molar tooth pulled out by his dentist today about 230 pm. He is on blood thinners, Coumadin. He was not asked to stop his blood thinners prior to today. Pt has been bleeding from his mouth after the procedure.  Patient does not have chest pain, shortness of breath, lightheadedness or dizziness.  Denies cough, fever or chills.  No nausea, vomiting, diarrhea, abdominal pain, symptoms of UTI.  No hematuria, hematochezia. Pt states that he was given amoxicillin prescription for 7 days due to history of endocarditis.  In the ED vitals were stable.  Hemoglobin of 16.8 and INR of 2.68.  Had mild AKI. Patient placed on observation.  FFP given to reverse INR.   Hospital course Bleeding from right molar area Following recent procedure for dental extraction.   Hemodynamically stable and hemoglobin stable at 16.  Received FFP and 5 mg vitamin K in the ED.  INR of 2.08 this morning.  No further bleeding from the mouth since admission.  Instructed to avoid NSAIDs. Coumadin on hold and can be resumed from tomorrow if no further bleeding and seen by his dentist this afternoon.  (Patient is planning to go see his dentist after discharge today).  DVT On Coumadin.  Therapeutic INR.  Coumadin on hold until tomorrow.  Acute on chronic kidney disease stage III Mildly worsened renal function suspect prerenal.  Received IV fluids.  Remaining medical issues are stable. Patient can be discharged home.  Discharge Instructions   Allergies as of 09/01/2017   No Known Allergies     Medication List    STOP taking these medications   colchicine 0.6 MG tablet   levofloxacin 750 MG tablet Commonly known as:  LEVAQUIN   meloxicam 7.5 MG tablet Commonly known as:  MOBIC   methocarbamol 500 MG tablet Commonly known as:  ROBAXIN   metoprolol tartrate 25 MG tablet Commonly known as:  LOPRESSOR   omeprazole 20 MG capsule Commonly known as:  PRILOSEC     TAKE these medications   acetaminophen 500 MG tablet Commonly known as:  TYLENOL Take 1,000 mg by mouth 4 (four) times daily as needed for mild pain.   albuterol 108 (90 Base) MCG/ACT inhaler Commonly known as:  PROVENTIL HFA;VENTOLIN HFA Inhale 1-2 puffs into the lungs every 6 (six) hours as needed for wheezing or shortness of breath.   atropine 1 % ophthalmic solution Place 1 drop into the left eye 2 (two)  times daily.   bisacodyl 5 MG EC tablet Commonly known as:  DULCOLAX Take 5 mg by mouth 2 (two) times daily as needed for mild constipation or moderate constipation.   brimonidine 0.2 % ophthalmic solution Commonly known as:  ALPHAGAN Place 1 drop into both eyes 2 (two) times daily.   calcium-vitamin D 500-200 MG-UNIT tablet Commonly known as:  OSCAL WITH D Take 1 tablet by mouth daily  with breakfast.   capsaicin 0.025 % cream Commonly known as:  ZOSTRIX Apply 1 application topically 2 (two) times daily as needed (skin care).   carboxymethylcellulose 0.5 % Soln Commonly known as:  REFRESH PLUS Place 1 drop into both eyes 2 (two) times daily.   cyclobenzaprine 10 MG tablet Commonly known as:  FLEXERIL Take 10 mg by mouth every 8 (eight) hours as needed for muscle spasms.   diphenhydrAMINE 50 MG capsule Commonly known as:  BENADRYL Take 50 mg by mouth at bedtime.   furosemide 20 MG tablet Commonly known as:  LASIX take 1 tablet by mouth once daily   gabapentin 600 MG tablet Commonly known as:  NEURONTIN Take 1,200 mg by mouth at bedtime.   homatropine 2 % ophthalmic solution Place 2 drops into both eyes 2 (two) times daily.   HYDROcodone-acetaminophen 5-325 MG tablet Commonly known as:  NORCO/VICODIN Take 1-2 tablets by mouth every 4 (four) hours as needed.   ketorolac 0.5 % ophthalmic solution Commonly known as:  ACULAR Place 1 drop into both eyes 2 (two) times daily.   mometasone 220 MCG/INH inhaler Commonly known as:  ASMANEX Inhale 2 puffs into the lungs daily as needed (SIB).   mycophenolate 500 MG tablet Commonly known as:  CELLCEPT Take 600 mg by mouth 2 (two) times daily.   OVER THE COUNTER MEDICATION Apply 1 application topically 2 (two) times daily as needed (pain). Menthol/M-Salicylate 10-15% topical cream   prednisoLONE acetate 1 % ophthalmic suspension Commonly known as:  PRED FORTE Place 1 drop into both eyes 2 (two) times daily.   sildenafil 100 MG tablet Commonly known as:  VIAGRA Take 50 mg by mouth daily as needed for erectile dysfunction.   timolol 0.5 % ophthalmic gel-forming Commonly known as:  TIMOPTIC-XR Place 1 drop into both eyes daily.   traMADol 50 MG tablet Commonly known as:  ULTRAM Take 50 mg by mouth 3 (three) times daily as needed for moderate pain. Maximum dose= 8 tablets per day For pain   valACYclovir  1000 MG tablet Commonly known as:  VALTREX Take 1,000 mg by mouth daily.   warfarin 5 MG tablet Commonly known as:  COUMADIN Take 0.5-1 tablets (2.5-5 mg total) by mouth daily. Take 2.5mg  by mouth on Saturdays, take 5mg  by mouth all other days Start taking on:  09/02/2017      Follow-up Information    [facility] Schedule an appointment as soon as possible for a visit in 1 week(s).   Contact information: [address] [facility] [place] [address] [phone]        follow up with the dentist this afternoon Follow up.          No Known Allergies    Procedures/Studies:  No results found.    Subjective: Seen and examined this morning.  No further bleeding from the mouth.  Tolerating diet.  Discharge Exam: Vitals:   09/01/17 0606 09/01/17 0734  BP: 125/86 (!) 126/93  Pulse: 70 72  Resp: 10 17  Temp: 97.7 F (36.5 C) 97.6  F (36.4 C)  SpO2: 100% 100%   Vitals:   08/31/17 2348 09/01/17 0030 09/01/17 0606 09/01/17 0734  BP: (!) 150/99 (!) 141/90 125/86 (!) 126/93  Pulse: 78 81 70 72  Resp: 12 (!) 24 10 17   Temp:  98.2 F (36.8 C) 97.7 F (36.5 C) 97.6 F (36.4 C)  TempSrc:  Oral Oral Oral  SpO2: 97% 97% 100% 100%  Weight:      Height:        General: Not in distress HEENT: No pallor, oral cavity appears clean with right molar area without further bleeding, supple neck Chest: Clear bilaterally CVs: Normal S1 and S2, no murmurs GI: Soft, nondistended, nontender Muscular skeletal: Warm, no edema   The results of significant diagnostics from this hospitalization (including imaging, microbiology, ancillary and laboratory) are listed below for reference.     Microbiology: No results found for this or any previous visit (from the past 240 hour(s)).   Labs: BNP (last 3 results) Recent Labs    09/01/17 0021  BNP 87.7   Basic Metabolic Panel: Recent Labs  Lab 08/31/17 1811 09/01/17 0021  NA 140 142  K 4.3 4.0  CL  107 107  CO2 25 25  GLUCOSE 85 81  BUN 20 19  CREATININE 1.77* 1.65*  CALCIUM 9.1 8.8*   Liver Function Tests: No results for input(s): AST, ALT, ALKPHOS, BILITOT, PROT, ALBUMIN in the last 168 hours. No results for input(s): LIPASE, AMYLASE in the last 168 hours. No results for input(s): AMMONIA in the last 168 hours. CBC: Recent Labs  Lab 08/31/17 1811 09/01/17 0021  WBC 5.0 4.9  NEUTROABS 2.8  --   HGB 16.8 16.0  HCT 50.1 47.6  MCV 95.8 95.6  PLT 233 207   Cardiac Enzymes: No results for input(s): CKTOTAL, CKMB, CKMBINDEX, TROPONINI in the last 168 hours. BNP: Invalid input(s): POCBNP CBG: No results for input(s): GLUCAP in the last 168 hours. D-Dimer No results for input(s): DDIMER in the last 72 hours. Hgb A1c No results for input(s): HGBA1C in the last 72 hours. Lipid Profile No results for input(s): CHOL, HDL, LDLCALC, TRIG, CHOLHDL, LDLDIRECT in the last 72 hours. Thyroid function studies No results for input(s): TSH, T4TOTAL, T3FREE, THYROIDAB in the last 72 hours.  Invalid input(s): FREET3 Anemia work up No results for input(s): VITAMINB12, FOLATE, FERRITIN, TIBC, IRON, RETICCTPCT in the last 72 hours. Urinalysis    Component Value Date/Time   COLORURINE RED (A) 07/23/2015 0220   APPEARANCEUR TURBID (A) 07/23/2015 0220   LABSPEC 1.019 07/23/2015 0220   PHURINE 5.5 07/23/2015 0220   GLUCOSEU NEGATIVE 07/23/2015 0220   GLUCOSEU NEG mg/dL 16/12/9602 5409   HGBUR LARGE (A) 07/23/2015 0220   BILIRUBINUR LARGE (A) 07/23/2015 0220   KETONESUR 40 (A) 07/23/2015 0220   PROTEINUR >300 (A) 07/23/2015 0220   UROBILINOGEN 0.2 11/10/2008 0222   NITRITE POSITIVE (A) 07/23/2015 0220   LEUKOCYTESUR LARGE (A) 07/23/2015 0220   Sepsis Labs Invalid input(s): PROCALCITONIN,  WBC,  LACTICIDVEN Microbiology No results found for this or any previous visit (from the past 240 hour(s)).   Time coordinating discharge: <63minutes  SIGNED:   Eddie North,  MD  [facility] 09/01/2017, 11:27 AM Pager   If 7PM-7AM, please contact night-coverage [email or web address] Password [email or web address]

## 2019-12-06 ENCOUNTER — Other Ambulatory Visit: Payer: Self-pay

## 2019-12-06 DIAGNOSIS — I8390 Asymptomatic varicose veins of unspecified lower extremity: Secondary | ICD-10-CM

## 2019-12-20 ENCOUNTER — Other Ambulatory Visit: Payer: Self-pay

## 2019-12-20 ENCOUNTER — Ambulatory Visit (INDEPENDENT_AMBULATORY_CARE_PROVIDER_SITE_OTHER): Payer: Medicare Other | Admitting: Physician Assistant

## 2019-12-20 ENCOUNTER — Ambulatory Visit (HOSPITAL_COMMUNITY)
Admission: RE | Admit: 2019-12-20 | Discharge: 2019-12-20 | Disposition: A | Discharge status: Home / Self Care | Payer: Medicare Other | Source: Ambulatory Visit | Attending: Vascular Surgery | Admitting: Vascular Surgery

## 2019-12-20 VITALS — BP 133/92 | HR 74 | Temp 98.2°F | Resp 20 | Ht 70.0 in | Wt 244.3 lb

## 2019-12-20 DIAGNOSIS — I8390 Asymptomatic varicose veins of unspecified lower extremity: Secondary | ICD-10-CM | POA: Diagnosis present

## 2019-12-20 DIAGNOSIS — I872 Venous insufficiency (chronic) (peripheral): Secondary | ICD-10-CM

## 2019-12-20 NOTE — Progress Notes (Signed)
[facility] [place]   Reason for referral: Swollen right leg with history of DVT  History of Present Illness  Richard Shepherd is a 54 y.o. male who presents with chief complaint: swollen leg.  Patient notes, onset of swelling right leg with varicose veins that started after a right calf DVT at age 33 managed with Coumadin.  He then had a right thigh DVT and has been manged for life on Xarelto.  The patient has hadpositive history of varicose vein, no history of venous stasis ulcers, no history of  Lymphedema and no history of skin changes in lower legs.  There is no family history of venous disorders.  The patient has  used compression stockings in the past.  He states that after prolonged sitting and standing he gets swelling in the right LE.  He denise history of PE.  He has used knee high compression in the past and elevation when at rest.  Past med. Hx: Sarcoidosis, hyperlipidemia, pericarditis, endocarditis.  He denise DM and CAD.  Past Medical History:  Diagnosis Date  . Arthritis    "all over" (08/31/2017)  . Asthma   . Chronic lower back pain   . CKD (chronic kidney disease), stage III (HCC)    [name] 08/31/2017  . DVT (deep venous thrombosis) (HCC) < & 06/2006   a. mainly affect R leg  . Endocarditis 2018  . Fibromyalgia   . Heart murmur   . OSA on CPAP   . Pericarditis 2017  . Sarcoidosis    a. eye involvement only    Past Surgical History:  Procedure Laterality Date  . CATARACT EXTRACTION W/ INTRAOCULAR LENS  IMPLANT, BILATERAL Bilateral 2012  . EYE SURGERY Bilateral 2015   scraped calcium from cornea  . GLAUCOMA SURGERY Bilateral 2012    Social History   Socioeconomic History  . Marital status: Married    Spouse name: Not on file  . Number of children: Not on file  . Years of education: Not on file  . Highest education level: Not on file  Occupational History  . Not on file  Tobacco Use  . Smoking status: Former Smoker    Packs/day:  0.12    Years: 4.00    Pack years: 0.48    Types: Cigarettes  . Smokeless tobacco: Never Used  . Tobacco comment: 08/21/2017 "stopped in the 1990s"  Vaping Use  . Vaping Use: Never used  Substance and Sexual Activity  . Alcohol use: Yes    Comment: 08/31/2017 "might have a margarita once/month; if that"  . Drug use: Never  . Sexual activity: Yes  Other Topics Concern  . Not on file  Social History Narrative  . Not on file   Social Determinants of Health   Financial Resource Strain:   . Difficulty of Paying Living Expenses: Not on file  Food Insecurity:   . Worried About Programme researcher, broadcasting/film/video in the Last Year: Not on file  . Ran Out of Food in the Last Year: Not on file  Transportation Needs:   . Lack of Transportation (Medical): Not on file  . Lack of Transportation (Non-Medical): Not on file  Physical Activity:   . Days of Exercise per Week: Not on file  . Minutes of Exercise per Session: Not on file  Stress:   . Feeling of Stress : Not on file  Social Connections:   . Frequency of Communication with Friends and Family: Not on file  . Frequency of  Social Gatherings with Friends and Family: Not on file  . Attends Religious Services: Not on file  . Active Member of Clubs or Organizations: Not on file  . Attends Banker Meetings: Not on file  . Marital Status: Not on file  Intimate Partner Violence:   . Fear of Current or Ex-Partner: Not on file  . Emotionally Abused: Not on file  . Physically Abused: Not on file  . Sexually Abused: Not on file    Family History  Problem Relation Age of Onset  . Breast cancer Mother   . Multiple sclerosis Father   . Multiple sclerosis Brother   . Diabetes Brother     Current Outpatient Medications on File Prior to Visit  Medication Sig Dispense Refill  . acetaminophen (TYLENOL) 500 MG tablet Take 1,000 mg by mouth 4 (four) times daily as needed for mild pain.    Marland Kitchen albuterol (PROVENTIL HFA;VENTOLIN HFA) 108 (90 Base)  MCG/ACT inhaler Inhale 1-2 puffs into the lungs every 6 (six) hours as needed for wheezing or shortness of breath.    Marland Kitchen atropine 1 % ophthalmic solution Place 1 drop into the left eye 2 (two) times daily.    . bisacodyl (DULCOLAX) 5 MG EC tablet Take 5 mg by mouth 2 (two) times daily as needed for mild constipation or moderate constipation.    . brimonidine (ALPHAGAN) 0.2 % ophthalmic solution Place 1 drop into both eyes 2 (two) times daily.     . calcium-vitamin D (OSCAL WITH D) 500-200 MG-UNIT per tablet Take 1 tablet by mouth daily with breakfast.    . capsaicin (ZOSTRIX) 0.025 % cream Apply 1 application topically 2 (two) times daily as needed (skin care).     . carboxymethylcellulose (REFRESH PLUS) 0.5 % SOLN Place 1 drop into both eyes 2 (two) times daily.    . cyclobenzaprine (FLEXERIL) 10 MG tablet Take 10 mg by mouth every 8 (eight) hours as needed for muscle spasms.    . diphenhydrAMINE (BENADRYL) 50 MG capsule Take 50 mg by mouth at bedtime.    . furosemide (LASIX) 20 MG tablet take 1 tablet by mouth once daily 90 tablet 3  . gabapentin (NEURONTIN) 600 MG tablet Take 1,200 mg by mouth at bedtime.      . homatropine 2 % ophthalmic solution Place 2 drops into both eyes 2 (two) times daily.      Marland Kitchen HYDROcodone-acetaminophen (NORCO/VICODIN) 5-325 MG tablet Take 1-2 tablets by mouth every 4 (four) hours as needed. 8 tablet 0  . mometasone (ASMANEX) 220 MCG/INH inhaler Inhale 2 puffs into the lungs daily as needed (SIB).     Marland Kitchen mycophenolate (CELLCEPT) 500 MG tablet Take 600 mg by mouth 2 (two) times daily.     Marland Kitchen OVER THE COUNTER MEDICATION Apply 1 application topically 2 (two) times daily as needed (pain). Menthol/M-Salicylate 10-15% topical cream     . prednisoLONE acetate (PRED FORTE) 1 % ophthalmic suspension Place 1 drop into both eyes 2 (two) times daily.     . Rivaroxaban (XARELTO PO) Take by mouth.    . sildenafil (VIAGRA) 100 MG tablet Take 50 mg by mouth daily as needed for erectile  dysfunction.    . timolol (TIMOPTIC-XR) 0.5 % ophthalmic gel-forming Place 1 drop into both eyes daily.    . traMADol (ULTRAM) 50 MG tablet Take 50 mg by mouth 3 (three) times daily as needed for moderate pain. Maximum dose= 8 tablets per day For pain    .  valACYclovir (VALTREX) 1000 MG tablet Take 1,000 mg by mouth daily.     No current facility-administered medications on file prior to visit.    Allergies as of 12/20/2019  . (No Known Allergies)     ROS:   General:  No weight loss, Fever, chills  HEENT: No recent headaches, no nasal bleeding, no visual changes, no sore throat  Neurologic: No dizziness, blackouts, seizures. No recent symptoms of stroke or mini- stroke. No recent episodes of slurred speech, or temporary blindness.  Cardiac: No recent episodes of chest pain/pressure, no shortness of breath at rest.  No shortness of breath with exertion.  Denies history of atrial fibrillation or irregular heartbeat  Vascular: No history of rest pain in feet.  No history of claudication.  No history of non-healing ulcer, No history of DVT   Pulmonary: No home oxygen, no productive cough, no hemoptysis,  No asthma or wheezing  Musculoskeletal:  [ ]  Arthritis, [ ]  Low back pain,  [ ]  Joint pain  Hematologic:No history of hypercoagulable state.  No history of easy bleeding.  No history of anemia  Gastrointestinal: No hematochezia or melena,  No gastroesophageal reflux, no trouble swallowing  Urinary: [ ]  chronic Kidney disease, [ ]  on HD - [ ]  MWF or [ ]  TTHS, [ ]  Burning with urination, [ ]  Frequent urination, [ ]  Difficulty urinating;   Skin: No rashes  Psychological: No history of anxiety,  No history of depression  Physical Examination  Vitals:   12/20/19 1357  BP: (!) 133/92  Pulse: 74  Resp: 20  Temp: 98.2 F (36.8 C)  TempSrc: Temporal  SpO2: 96%  Weight: 244 lb 4.8 oz (110.8 kg)  Height: 5\' 10"  (1.778 m)    Body mass index is 35.05 kg/m.  General:  Alert  and oriented, no acute distress HEENT: Normal Neck: No bruit or JVD Pulmonary: Clear to auscultation bilaterally Cardiac: Regular Rate and Rhythm with murmur Abdomen: Soft, non-tender, non-distended, no mass, no scars Skin: No rash Extremity Pulses:  2+ radial, brachial, femoral, dorsalis pedis, posterior tibial pulses bilaterally Musculoskeletal: minimal edema in the right LE with varicose veins.   Neurologic: Upper and lower extremity motor 5/5 and symmetric        DATA:   Venous Reflux Times  +--------------+---------+------+-----------+------------+--------------+  RIGHT     Reflux NoRefluxReflux TimeDiameter cmsComments                  Yes                      +--------------+---------+------+-----------+------------+--------------+  CFV            yes  >1 second                +--------------+---------+------+-----------+------------+--------------+  FV mid          yes  >1 second                +--------------+---------+------+-----------+------------+--------------+  Popliteal         yes  >1 second                +--------------+---------+------+-----------+------------+--------------+  GSV at SFJ        yes  >500 ms   0.944           +--------------+---------+------+-----------+------------+--------------+  GSV prox thighno               0.679           +--------------+---------+------+-----------+------------+--------------+  GSV  mid thigh                 0.615           +--------------+---------+------+-----------+------------+--------------+  GSV dist thighno               0.541           +--------------+---------+------+-----------+------------+--------------+  GSV at knee  no               0.552            +--------------+---------+------+-----------+------------+--------------+  GSV prox calf       yes  >500 ms   0.345           +--------------+---------+------+-----------+------------+--------------+  SSV Pop Fossa                     not visualized  +--------------+---------+------+-----------+------------+--------------+  SSV prox calf                 0.132           +--------------+---------+------+-----------+------------+--------------+  SSV mid calf                 0.115           +--------------+---------+------+-----------+------------+--------------+     +--------------+---------+------+-----------+------------+--------+  LEFT     Reflux NoRefluxReflux TimeDiameter cmsComments               Yes                   +--------------+---------+------+-----------+------------+--------+  CFV      no                         +--------------+---------+------+-----------+------------+--------+  FV mid    no                         +--------------+---------+------+-----------+------------+--------+  Popliteal   no                         +--------------+---------+------+-----------+------------+--------+  GSV at [facility]  no               0.93        +--------------+---------+------+-----------+------------+--------+  GSV prox thighno               0.392        +--------------+---------+------+-----------+------------+--------+  GSV mid thigh no               0.424        +--------------+---------+------+-----------+------------+--------+  GSV dist thighno               0.442         +--------------+---------+------+-----------+------------+--------+  GSV at knee  no               0.483        +--------------+---------+------+-----------+------------+--------+  GSV prox calf no               0.429        +--------------+---------+------+-----------+------------+--------+  SSV Pop Fossa no               0.32        +--------------+---------+------+-----------+------------+--------+  SSV prox calf no               0.239        +--------------+---------+------+-----------+------------+--------+  SSV mid calf no  0.232        +--------------+---------+------+-----------+------------+--------+         Summary:  Right:  - No evidence of deep vein thrombosis seen in the right lower extremity,  from the common femoral through the popliteal veins.  - No evidence of superficial venous thrombosis in the right lower  extremity.  - Deep vein reflux in the CFV, FV, and popliteal vein.  - Superficial vein reflux in the SFJ.    Left:  - No evidence of deep vein thrombosis seen in the left lower extremity,  from the common femoral through the popliteal veins.  - No evidence of superficial venous thrombosis in the left lower  extremity.  - No evidence of deep vein reflux.  - No evidence of superficial vein reflux  Assessment: Venous reflux right LE with edema and varicose veins. He has deep system reflux as well as GSV reflux with vein size > 0.4 in vein diameter.    No evidence of DVT in B LE. Post thrombotic syndrome with history of DVT x 2.   He is managed on Xarelto for life.  [facility]    He denise history of non healing wounds and has used knee high compression in the past.     Plan: He will wear thigh high compression during the day and elevation when at rest.  He stays fairly active.  He will f/u in 3  months to discuss his options. He may benefit some from GSV ablation, but we can not improve the deep system reflux.    Mosetta Pigeon PA-C [facility] [place] Office: [phone]

## 2020-02-23 ENCOUNTER — Other Ambulatory Visit: Payer: Self-pay | Admitting: Physician Assistant

## 2020-02-23 ENCOUNTER — Other Ambulatory Visit: Payer: Self-pay | Admitting: *Deleted

## 2020-02-26 ENCOUNTER — Other Ambulatory Visit (HOSPITAL_COMMUNITY): Payer: Self-pay | Admitting: Physician Assistant

## 2020-02-26 ENCOUNTER — Other Ambulatory Visit: Payer: Self-pay | Admitting: Physician Assistant

## 2020-02-26 DIAGNOSIS — M25511 Pain in right shoulder: Secondary | ICD-10-CM

## 2020-03-05 ENCOUNTER — Ambulatory Visit (HOSPITAL_COMMUNITY): Admission: RE | Admit: 2020-03-05 | Payer: TRICARE For Life (TFL) | Source: Ambulatory Visit

## 2020-03-05 ENCOUNTER — Encounter (HOSPITAL_COMMUNITY): Payer: Self-pay

## 2020-03-15 ENCOUNTER — Ambulatory Visit (HOSPITAL_COMMUNITY)
Admission: RE | Admit: 2020-03-15 | Discharge: 2020-03-15 | Disposition: A | Discharge status: Home / Self Care | Payer: Medicare Other | Source: Ambulatory Visit | Attending: Physician Assistant | Admitting: Physician Assistant

## 2020-03-15 ENCOUNTER — Other Ambulatory Visit: Payer: Self-pay

## 2020-03-15 DIAGNOSIS — M25511 Pain in right shoulder: Secondary | ICD-10-CM | POA: Insufficient documentation

## 2020-03-15 MED ORDER — METHYLPREDNISOLONE ACETATE 80 MG/ML IJ SUSP
INTRAMUSCULAR | Status: AC
  Filled 2020-03-15: qty 1

## 2020-03-15 MED ORDER — BUPIVACAINE HCL (PF) 0.25 % IJ SOLN
INTRAMUSCULAR | Status: AC
  Filled 2020-03-15: qty 30

## 2020-03-15 MED ORDER — LIDOCAINE HCL (PF) 1 % IJ SOLN
5.0000 mL | Freq: Once | INTRAMUSCULAR | Status: AC
  Administered 2020-03-15: 5 mL via INTRADERMAL

## 2020-03-15 MED ORDER — IOHEXOL 180 MG/ML  SOLN
10.0000 mL | Freq: Once | INTRAMUSCULAR | Status: AC | PRN
  Administered 2020-03-15: 10 mL via INTRA_ARTICULAR

## 2020-03-15 MED ORDER — METHYLPREDNISOLONE ACETATE 40 MG/ML INJ SUSP (RADIOLOG
160.0000 mg | Freq: Once | INTRAMUSCULAR | Status: AC
  Administered 2020-03-15: 160 mg via INTRA_ARTICULAR

## 2020-03-15 MED ORDER — BUPIVACAINE HCL (PF) 0.25 % IJ SOLN
10.0000 mL | Freq: Once | INTRAMUSCULAR | Status: AC
  Administered 2020-03-15: 10 mL via INTRA_ARTICULAR

## 2020-03-27 ENCOUNTER — Ambulatory Visit (INDEPENDENT_AMBULATORY_CARE_PROVIDER_SITE_OTHER): Payer: No Typology Code available for payment source | Admitting: Vascular Surgery

## 2020-03-27 ENCOUNTER — Ambulatory Visit: Payer: Medicare Other | Admitting: Vascular Surgery

## 2020-03-27 ENCOUNTER — Other Ambulatory Visit: Payer: Self-pay

## 2020-03-27 VITALS — BP 124/78 | HR 67 | Temp 94.1°F | Resp 18 | Ht 71.0 in | Wt 246.0 lb

## 2020-03-27 DIAGNOSIS — I83811 Varicose veins of right lower extremities with pain: Secondary | ICD-10-CM | POA: Diagnosis not present

## 2020-03-27 NOTE — Progress Notes (Signed)
Patient name: Richard Shepherd MRN: [id number] DOB: [birth year] Sex: male  REASON FOR CONSULT: Symptomatic varicose veins right leg with pain and swelling  HPI: Richard Shepherd is a 55 y.o. male, with a several year history of progressively slowly worsening pain and swelling in the right leg.  Patient did have a DVT in the past and was treated with anticoagulation.  He has not had any other DVTs recently.  He states that after standing on his legs all day he developed swelling of the right leg and more prominent varicosities.  He has been wearing compression stockings since October 2021 when he is initially seen in our [facility].  He states these helped some but did not completely relieve all of his symptoms.  He does have some baseline weakness in his right leg and he is not sure what the etiology of this is.  His prior DVT occurred around the time of a sprained ankle.  Other medical problems include mild renal dysfunction, asthma, chronic lower back pain, sarcoidosis with involvement of the eyes.  These are all stable.  He is retired from the [employer] after 20 years of service in [employer].  He is on lifelong Xarelto.  Past Medical History:  Diagnosis Date  . Arthritis    "all over" (08/31/2017)  . Asthma   . Chronic lower back pain   . CKD (chronic kidney disease), stage III (HCC)    [name] 08/31/2017  . DVT (deep venous thrombosis) (HCC) < & 06/2006   a. mainly affect R leg  . Endocarditis 2018  . Fibromyalgia   . Heart murmur   . OSA on CPAP   . Pericarditis 2017  . Sarcoidosis    a. eye involvement only   Past Surgical History:  Procedure Laterality Date  . CATARACT EXTRACTION W/ INTRAOCULAR LENS  IMPLANT, BILATERAL Bilateral 2012  . EYE SURGERY Bilateral 2015   scraped calcium from cornea  . GLAUCOMA SURGERY Bilateral 2012    Family History  Problem Relation Age of Onset  . Breast cancer Mother   . Multiple sclerosis Father   . Multiple sclerosis Brother   . Diabetes  Brother     SOCIAL HISTORY: Social History   Socioeconomic History  . Marital status: Married    Spouse name: Not on file  . Number of children: Not on file  . Years of education: Not on file  . Highest education level: Not on file  Occupational History  . Not on file  Tobacco Use  . Smoking status: Former Smoker    Packs/day: 0.12    Years: 4.00    Pack years: 0.48    Types: Cigarettes  . Smokeless tobacco: Never Used  . Tobacco comment: 08/21/2017 "stopped in the 1990s"  Vaping Use  . Vaping Use: Never used  Substance and Sexual Activity  . Alcohol use: Yes    Comment: 08/31/2017 "might have a margarita once/month; if that"  . Drug use: Never  . Sexual activity: Yes  Other Topics Concern  . Not on file  Social History Narrative  . Not on file   Social Determinants of Health   Financial Resource Strain: Not on file  Food Insecurity: Not on file  Transportation Needs: Not on file  Physical Activity: Not on file  Stress: Not on file  Social Connections: Not on file  Intimate Partner Violence: Not on file    No Known Allergies  Current Outpatient Medications  Medication Sig Dispense Refill  .  acetaminophen (TYLENOL) 500 MG tablet Take 1,000 mg by mouth 4 (four) times daily as needed for mild pain.    Marland Kitchen albuterol (PROVENTIL HFA;VENTOLIN HFA) 108 (90 Base) MCG/ACT inhaler Inhale 1-2 puffs into the lungs every 6 (six) hours as needed for wheezing or shortness of breath.    Marland Kitchen atropine 1 % ophthalmic solution Place 1 drop into the left eye 2 (two) times daily.    . bisacodyl (DULCOLAX) 5 MG EC tablet Take 5 mg by mouth 2 (two) times daily as needed for mild constipation or moderate constipation.    . brimonidine (ALPHAGAN) 0.2 % ophthalmic solution Place 1 drop into both eyes 2 (two) times daily.     . calcium-vitamin D (OSCAL WITH D) 500-200 MG-UNIT per tablet Take 1 tablet by mouth daily with breakfast.    . capsaicin (ZOSTRIX) 0.025 % cream Apply 1 application  topically 2 (two) times daily as needed (skin care).     . carboxymethylcellulose (REFRESH PLUS) 0.5 % SOLN Place 1 drop into both eyes 2 (two) times daily.    . cyclobenzaprine (FLEXERIL) 10 MG tablet Take 10 mg by mouth every 8 (eight) hours as needed for muscle spasms.    . diphenhydrAMINE (BENADRYL) 50 MG capsule Take 50 mg by mouth at bedtime.    . furosemide (LASIX) 20 MG tablet take 1 tablet by mouth once daily 90 tablet 3  . gabapentin (NEURONTIN) 600 MG tablet Take 1,200 mg by mouth at bedtime.      . homatropine 2 % ophthalmic solution Place 2 drops into both eyes 2 (two) times daily.      Marland Kitchen HYDROcodone-acetaminophen (NORCO/VICODIN) 5-325 MG tablet Take 1-2 tablets by mouth every 4 (four) hours as needed. 8 tablet 0  . mometasone (ASMANEX) 220 MCG/INH inhaler Inhale 2 puffs into the lungs daily as needed (SIB).     Marland Kitchen mycophenolate (CELLCEPT) 500 MG tablet Take 600 mg by mouth 2 (two) times daily.     Marland Kitchen OVER THE COUNTER MEDICATION Apply 1 application topically 2 (two) times daily as needed (pain). Menthol/M-Salicylate 10-15% topical cream     . prednisoLONE acetate (PRED FORTE) 1 % ophthalmic suspension Place 1 drop into both eyes 2 (two) times daily.     . Rivaroxaban (XARELTO PO) Take by mouth.    . sildenafil (VIAGRA) 100 MG tablet Take 50 mg by mouth daily as needed for erectile dysfunction.    . timolol (TIMOPTIC-XR) 0.5 % ophthalmic gel-forming Place 1 drop into both eyes daily.    . traMADol (ULTRAM) 50 MG tablet Take 50 mg by mouth 3 (three) times daily as needed for moderate pain. Maximum dose= 8 tablets per day For pain    . valACYclovir (VALTREX) 1000 MG tablet Take 1,000 mg by mouth daily.     No current facility-administered medications for this visit.    ROS:   General:  No weight loss, Fever, chills  Cardiac: No recent episodes of chest pain/pressure, no shortness of breath at rest.  No shortness of breath with exertion.  Denies history of atrial fibrillation or  irregular heartbeat   Physical Examination  Vitals:   03/27/20 1107  BP: 124/78  Pulse: 67  Resp: 18  Temp: (!) 94.1 F (34.5 C)  TempSrc: Temporal  SpO2: 97%  Weight: 246 lb (111.6 kg)  Height: 5\' 11"  (1.803 m)    Body mass index is 34.31 kg/m.  General:  Alert and oriented, no acute distress HEENT: Normal Neck: No JVD Cardiac:  Regular Rate and Rhythm Skin: No rash, scattered varicosities primarily pretibial in nature right lower extremity diffuse extending from the knee all the way to the ankle, no ulceration Extremity Pulses:  2+ dorsalis pedis pulses bilaterally Musculoskeletal: No deformity or edema  Neurologic: Upper and lower extremity motor 5/5 and symmetric  DATA:  Reviewed the patient's previous duplex exam dated October 2021.  This shows diffuse reflux in the right greater saphenous vein with a 5 to 6 mm vein diameter including the junction..  There was also deep vein reflux in the superficial femoral popliteal and femoral vein.  ASSESSMENT: Symptomatic varicose veins with pain and swelling right lower extremity not improved completely with compression therapy alone.   PLAN: Laser ablation right greater saphenous vein with greater than 20 stab avulsions in the near future pending insurance approval.  Risk benefits possible complications of procedure details including but not limited to bleeding infection DVT risk 1 and 100,000 all discussed with the patient today.  He understands and agrees to proceed.  We will stop his Xarelto.  Procedure.   Fabienne Bruns, MD [facility] [place] Office: [phone]

## 2020-04-17 ENCOUNTER — Encounter: Payer: Self-pay | Admitting: Vascular Surgery

## 2020-05-07 ENCOUNTER — Other Ambulatory Visit: Payer: Self-pay | Admitting: *Deleted

## 2020-05-07 DIAGNOSIS — I83811 Varicose veins of right lower extremities with pain: Secondary | ICD-10-CM

## 2020-05-29 ENCOUNTER — Other Ambulatory Visit: Payer: Self-pay

## 2020-05-29 ENCOUNTER — Encounter: Payer: Self-pay | Admitting: Vascular Surgery

## 2020-05-29 ENCOUNTER — Ambulatory Visit (INDEPENDENT_AMBULATORY_CARE_PROVIDER_SITE_OTHER): Payer: No Typology Code available for payment source | Admitting: Vascular Surgery

## 2020-05-29 VITALS — BP 110/70 | HR 80 | Temp 98.4°F | Resp 16 | Ht 70.0 in | Wt 246.0 lb

## 2020-05-29 DIAGNOSIS — M7989 Other specified soft tissue disorders: Secondary | ICD-10-CM

## 2020-05-29 DIAGNOSIS — I83811 Varicose veins of right lower extremities with pain: Secondary | ICD-10-CM

## 2020-05-29 HISTORY — PX: ENDOVENOUS ABLATION SAPHENOUS VEIN W/ LASER: SUR449

## 2020-05-29 NOTE — Progress Notes (Signed)
     Laser Ablation Procedure    Date: 05/29/2020   Richard Shepherd DOB:18-Jan-1966  Consent signed: Yes      Surgeon: Fabienne Bruns MD   Procedure: Laser Ablation: right Greater Saphenous Vein  BP 110/70 (BP Location: Left Arm, Patient Position: Sitting, Cuff Size: Large)   Pulse 80   Temp 98.4 F (36.9 C) (Temporal)   Resp 16   Ht 5\' 10"  (1.778 m)   Wt 246 lb (111.6 kg)   SpO2 96%   BMI 35.30 kg/m   Tumescent Anesthesia: 475 cc 0.9% NaCl with 50 cc Lidocaine HCL 1%  and 15 cc 8.4% NaHCO3  Local Anesthesia: 10 cc Lidocaine HCL and NaHCO3 (ratio 2:1)  7 watts continuous mode     Total energy: 1833 Joules     Total time: 261 seconds Treatment Length 37 cm  Laser Fiber Ref. #                              Lot # [id number]   Stab Phlebectomy: >20 Sites: Calf and Ankle  Patient tolerated procedure well  Notes: Patient wore face mask.  All staff members wore facial masks and facial shields/goggles.  Last dose of Xarelto taken by [name] on 05-26-2021.    Description of Procedure:  After marking the course of the secondary varicosities, the patient was placed on the operating table in the supine position, and the right leg was prepped and draped in sterile fashion.   Local anesthetic was administered and under ultrasound guidance the saphenous vein was accessed with a micro needle and guide wire; then the mirco puncture sheath was placed.  A guide wire was inserted saphenofemoral junction , followed by a 5 french sheath.  The position of the sheath and then the laser fiber below the junction was confirmed using the ultrasound.  Tumescent anesthesia was administered along the course of the saphenous vein using ultrasound guidance. The patient was placed in Trendelenburg position and protective laser glasses were placed on patient and staff, and the laser was fired at 7 watts continuous mode for a total of 1833 joules.   For stab phlebectomies, local anesthetic was  administered at the previously marked varicosities, and tumescent anesthesia was administered around the vessels.  Greater than 20 stab wounds were made using the tip of an 11 blade. And using the vein hook, the phlebectomies were performed using a hemostat to avulse the varicosities.  Adequate hemostasis was achieved.     Steri strips were applied to the stab wounds and ABD pads and thigh high compression stockings were applied.  Ace wrap bandages were applied over the phlebectomy sites and at the top of the saphenofemoral junction. Blood loss was less than 15 cc.  Discharge instructions reviewed with patient and hardcopy of discharge instructions given to patient to take home. The patient ambulated out of the operating room having tolerated the procedure well.  05-28-2021, MD [facility] [place] Office: [phone]

## 2020-06-05 ENCOUNTER — Other Ambulatory Visit: Payer: Self-pay

## 2020-06-05 ENCOUNTER — Ambulatory Visit (HOSPITAL_COMMUNITY)
Admission: RE | Admit: 2020-06-05 | Discharge: 2020-06-05 | Disposition: A | Discharge status: Home / Self Care | Payer: No Typology Code available for payment source | Source: Ambulatory Visit | Attending: Vascular Surgery | Admitting: Vascular Surgery

## 2020-06-05 ENCOUNTER — Encounter: Payer: Self-pay | Admitting: Vascular Surgery

## 2020-06-05 ENCOUNTER — Ambulatory Visit (INDEPENDENT_AMBULATORY_CARE_PROVIDER_SITE_OTHER): Payer: Medicare Other | Admitting: Vascular Surgery

## 2020-06-05 VITALS — BP 112/73 | HR 73 | Temp 96.3°F | Resp 18 | Ht 70.0 in | Wt 246.0 lb

## 2020-06-05 DIAGNOSIS — I83811 Varicose veins of right lower extremities with pain: Secondary | ICD-10-CM | POA: Diagnosis present

## 2020-06-05 NOTE — Progress Notes (Signed)
Patient is a 55 year old male who returns for follow-up today.  He underwent laser ablation and stab avulsions of the right leg approximately 1 week ago.  He still has some mild tenderness in the right medial thigh.  Otherwise he is doing well.  Physical exam:  Vitals:   06/05/20 1055  BP: 112/73  Pulse: 73  Resp: 18  Temp: (!) 96.3 F (35.7 C)  TempSrc: Temporal  SpO2: 95%  Weight: 246 lb (111.6 kg)  Height: 5\' 10"  (1.778 m)    Extremities: Mild edema right lower extremity incisions healing well  Data: Patient had duplex ultrasound today which shows good closure of the right greater saphenous vein no evidence of DVT  Assessment: Doing well status post laser ablation stab avulsions right leg.  Plan: Follow-up as needed  , MD [facility] [place] Office: [phone]

## 2020-12-10 ENCOUNTER — Encounter (HOSPITAL_COMMUNITY): Payer: Self-pay

## 2020-12-10 ENCOUNTER — Emergency Department (HOSPITAL_COMMUNITY): Payer: No Typology Code available for payment source

## 2020-12-10 ENCOUNTER — Emergency Department (HOSPITAL_COMMUNITY)
Admission: EM | Admit: 2020-12-10 | Discharge: 2020-12-11 | Disposition: A | Discharge status: Home / Self Care | Payer: No Typology Code available for payment source | Attending: Emergency Medicine | Admitting: Emergency Medicine

## 2020-12-10 ENCOUNTER — Other Ambulatory Visit: Payer: Self-pay

## 2020-12-10 DIAGNOSIS — R079 Chest pain, unspecified: Secondary | ICD-10-CM | POA: Insufficient documentation

## 2020-12-10 DIAGNOSIS — R0602 Shortness of breath: Secondary | ICD-10-CM | POA: Diagnosis not present

## 2020-12-10 DIAGNOSIS — Z5321 Procedure and treatment not carried out due to patient leaving prior to being seen by health care provider: Secondary | ICD-10-CM | POA: Diagnosis not present

## 2020-12-10 DIAGNOSIS — R059 Cough, unspecified: Secondary | ICD-10-CM | POA: Diagnosis not present

## 2020-12-10 DIAGNOSIS — R110 Nausea: Secondary | ICD-10-CM | POA: Insufficient documentation

## 2020-12-10 DIAGNOSIS — R197 Diarrhea, unspecified: Secondary | ICD-10-CM | POA: Diagnosis not present

## 2020-12-10 LAB — URINALYSIS, ROUTINE W REFLEX MICROSCOPIC
Bacteria, UA: NONE SEEN
Bilirubin Urine: NEGATIVE
Glucose, UA: >=500 mg/dL — AB
Hgb urine dipstick: NEGATIVE
Ketones, ur: 5 mg/dL — AB
Leukocytes,Ua: NEGATIVE
Nitrite: NEGATIVE
Protein, ur: 100 mg/dL — AB
Specific Gravity, Urine: 1.026 (ref 1.005–1.030)
pH: 6 (ref 5.0–8.0)

## 2020-12-10 LAB — TROPONIN I (HIGH SENSITIVITY)
Troponin I (High Sensitivity): 15 ng/L (ref <18)
Troponin I (High Sensitivity): 15 ng/L (ref <18)

## 2020-12-10 LAB — CBC WITH DIFFERENTIAL/PLATELET
Abs Immature Granulocytes: 0.03 10*3/uL (ref 0.00–0.07)
Basophils Absolute: 0 10*3/uL (ref 0.0–0.1)
Basophils Relative: 1 %
Eosinophils Absolute: 0.2 10*3/uL (ref 0.0–0.5)
Eosinophils Relative: 4 %
HCT: 58.4 % — ABNORMAL HIGH (ref 39.0–52.0)
Hemoglobin: 20.5 g/dL — ABNORMAL HIGH (ref 13.0–17.0)
Immature Granulocytes: 1 %
Lymphocytes Relative: 29 %
Lymphs Abs: 1.2 10*3/uL (ref 0.7–4.0)
MCH: 34.1 pg — ABNORMAL HIGH (ref 26.0–34.0)
MCHC: 35.1 g/dL (ref 30.0–36.0)
MCV: 97.2 fL (ref 80.0–100.0)
Monocytes Absolute: 0.4 10*3/uL (ref 0.1–1.0)
Monocytes Relative: 10 %
Neutro Abs: 2.4 10*3/uL (ref 1.7–7.7)
Neutrophils Relative %: 55 %
Platelets: 165 10*3/uL (ref 150–400)
RBC: 6.01 MIL/uL — ABNORMAL HIGH (ref 4.22–5.81)
RDW: 12.4 % (ref 11.5–15.5)
WBC: 4.3 10*3/uL (ref 4.0–10.5)
nRBC: 0 % (ref 0.0–0.2)

## 2020-12-10 LAB — COMPREHENSIVE METABOLIC PANEL
ALT: 26 U/L (ref 0–44)
AST: 41 U/L (ref 15–41)
Albumin: 3.8 g/dL (ref 3.5–5.0)
Alkaline Phosphatase: 64 U/L (ref 38–126)
Anion gap: 9 (ref 5–15)
BUN: 15 mg/dL (ref 6–20)
CO2: 22 mmol/L (ref 22–32)
Calcium: 9.1 mg/dL (ref 8.9–10.3)
Chloride: 103 mmol/L (ref 98–111)
Creatinine, Ser: 1.6 mg/dL — ABNORMAL HIGH (ref 0.61–1.24)
GFR, Estimated: 51 mL/min — ABNORMAL LOW (ref >60)
Glucose, Bld: 130 mg/dL — ABNORMAL HIGH (ref 70–99)
Potassium: 4.4 mmol/L (ref 3.5–5.1)
Sodium: 134 mmol/L — ABNORMAL LOW (ref 135–145)
Total Bilirubin: 0.9 mg/dL (ref 0.3–1.2)
Total Protein: 7.5 g/dL (ref 6.5–8.1)

## 2020-12-10 LAB — LIPASE, BLOOD: Lipase: 22 U/L (ref 11–51)

## 2020-12-10 NOTE — ED Triage Notes (Signed)
Pt reports multiple complaints: Pt received his COVID booster and flu shot last week and ever since then he has had chest pain, sob, nausea, diarrhea and coughing.  Resp e.u

## 2020-12-10 NOTE — ED Provider Notes (Signed)
Emergency Medicine Provider Triage Evaluation Note  Richard Shepherd , a 55 y.o. male  was evaluated in triage.  Pt complains of chest pain, shortness of breath, abdominal pain, nausea, diarrhea, and coughing.  Patient reports that his cough, nausea, diarrhea, and abdominal pain started after receiving 19 booster and influenza vaccination last Tuesday.  Patient reports that chest pain started on Thursday.  Chest pain has been intermittent since then.  Last episode of chest pain occurred this morning.  Pain is midsternal and does not radiate.  Pain is described as a pressure.  Review of Systems  Positive: Chest pain, shortness of breath, abdominal pain, nausea, diarrhea, coughing Negative: Fever, chills, lightheadedness, syncope, swelling or tenderness to lower extremities  Physical Exam  BP 133/90 (BP Location: Right Arm)   Pulse 87   Temp 98.7 F (37.1 C) (Oral)   Resp 18   Ht 5\' 10"  (1.778 m)   Wt 113.4 kg   SpO2 95%   BMI 35.87 kg/m  Gen:   Awake, no distress   Resp:  Normal effort, lungs clear to auscultation bilaterally MSK:   Moves extremities without difficulty; no swelling or tenderness to bilateral lower extremities. Other:  +2 radial pulse bilaterally.  Abdomen soft, nondistended, nontender, no peritoneal signs.  Medical Decision Making  Medically screening exam initiated at 11:30 AM.  Appropriate orders placed.  Zachrey Deutscher was informed that the remainder of the evaluation will be completed by another provider, this initial triage assessment does not replace that evaluation, and the importance of remaining in the ED until their evaluation is complete.  The patient appears stable so that the remainder of the work up may be completed by another provider.      Pamelia Hoit, PA-C 12/10/20 1132    02/09/21, DO 12/10/20 1151

## 2020-12-10 NOTE — ED Notes (Signed)
Pt left. 

## 2021-02-06 NOTE — Progress Notes (Signed)
Cardiology Office Note:    Date:  02/07/2021   ID:  Richard Shepherd, DOB 1965-05-01, MRN [id number]  PCP:  [facility]  Cardiologist:  Early Osmond, MD  Electrophysiologist:  None   Referring MD: [facility]   Chief Complaint/Reason for Referral: Establish cardiovascular care  History of Present Illness:    PROBLEM LIST: 1.  Sarcoidosis with known pulmonary, ophthalmic, and cardiac involvement with cardiac MRI in 2020 demonstrating inferoseptal and papillary muscle late gadolinium enhancement; ejection fraction 50% 2.  Hypertension 3.  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  Chronic kidney disease with a creatinine of around 1.6-1.8  Richard Shepherd is a 55 y.o. male with the indicated history here to establish cardiovascular care.  He had previously been seen at the [facility] hospital.  He had reported an episode of chest pain.  Stress test demonstrated a mild amount of ischemia in the region where he has cardiac sarcoidosis on his MRI.  Since that time he has had no recurrent chest pain.  He is relatively active and exercises moderately 3 days a week.  He has had no severe syncope or presyncope.  He denies any palpitations.  Denies any paroxysmal nocturnal dyspnea or orthopnea.  He has had some rectal bleeding and he does have a history of hemorrhoids.  He will be speaking with his PCP about this.  He is otherwise well without significant complaints.  Past Medical History:  Diagnosis Date   Arthritis    "all over" (08/31/2017)   Asthma    Chest pain    Chronic lower back pain    CKD (chronic kidney disease), stage III ([place])    Archie Endo 08/31/2017   DVT (deep venous thrombosis) ([place]) < & 06/2006   a. mainly affect R leg   Endocarditis 2018   Fibromyalgia    Heart murmur    OSA on CPAP    Pericarditis 2017   Positive cardiac stress test    Sarcoidosis    a. eye involvement only    Past Surgical History:  Procedure Laterality Date   CATARACT EXTRACTION W/  INTRAOCULAR LENS  IMPLANT, BILATERAL Bilateral 2012   ENDOVENOUS ABLATION SAPHENOUS VEIN W/ LASER Right 05/29/2020   endovenous laser ablation right greater saphenous vein and stab phlebectomy > 20 incisions by Ruta Hinds MD    EYE SURGERY Bilateral 2015   scraped calcium from cornea   GLAUCOMA SURGERY Bilateral 2012    Current Medications: Current Meds  Medication Sig   acetaminophen (TYLENOL) 500 MG tablet Take 1,000 mg by mouth 4 (four) times daily as needed for mild pain.   Adalimumab 40 MG/0.4ML PNKT INJECT 40MG  (0.4ML) SUBCUTANEOUSLY EVERY OTHER WEEK   albuterol (PROVENTIL HFA;VENTOLIN HFA) 108 (90 Base) MCG/ACT inhaler Inhale 1-2 puffs into the lungs every 6 (six) hours as needed for wheezing or shortness of breath.   atropine 1 % ophthalmic solution Place 1 drop into the left eye 2 (two) times daily.   atropine 1 % ophthalmic solution Place 1 drop into both eyes 2 times a day for three days each week.   bisacodyl (DULCOLAX) 5 MG EC tablet Take 5 mg by mouth 2 (two) times daily as needed for mild constipation or moderate constipation.   brimonidine (ALPHAGAN) 0.2 % ophthalmic solution Place 1 drop into both eyes 2 (two) times daily.    calcium-vitamin D (OSCAL WITH D) 500-200 MG-UNIT per tablet Take 1 tablet by mouth daily with breakfast.   capsaicin (ZOSTRIX) 0.025 %  cream Apply 1 application topically 2 (two) times daily as needed (skin care).    carboxymethylcellulose (REFRESH PLUS) 0.5 % SOLN Place 1 drop into both eyes 2 (two) times daily.   Cholecalciferol 50 MCG (2000 UT) TABS TAKE ONE TABLET BY MOUTH DAILY *NOTE CHANGE IN STRENGTH*   cyclobenzaprine (FLEXERIL) 10 MG tablet Take 10 mg by mouth every 8 (eight) hours as needed for muscle spasms.   cycloSPORINE (RESTASIS) 0.05 % ophthalmic emulsion INSTILL 1 DROP IN EACH EYE EVERY 12 HOURS   diclofenac Sodium (VOLTAREN) 1 % GEL APPLY 2 GRAMS TO AFFECTED AREA TWICE A DAY AS NEEDED   diphenhydrAMINE (BENADRYL) 50 MG capsule Take  50 mg by mouth at bedtime.   empagliflozin (JARDIANCE) 25 MG TABS tablet TAKE ONE-HALF TABLET BY MOUTH ONCE DAILY FOR HEART FAILURE   furosemide (LASIX) 20 MG tablet take 1 tablet by mouth once daily   gabapentin (NEURONTIN) 400 MG capsule Take 3 capsules by mouth at bedtime.   gabapentin (NEURONTIN) 600 MG tablet Take 1,200 mg by mouth at bedtime.   homatropine 2 % ophthalmic solution Place 2 drops into both eyes 2 (two) times daily.   HYDROcodone-acetaminophen (NORCO/VICODIN) 5-325 MG tablet Take 1-2 tablets by mouth every 4 (four) hours as needed.   mometasone (ASMANEX) 220 MCG/INH inhaler Inhale 2 puffs into the lungs daily as needed (SIB).    mycophenolate (CELLCEPT) 500 MG tablet Take 600 mg by mouth 2 (two) times daily.    nepafenac (ILEVRO) 0.3 % ophthalmic suspension INSTILL 1 DROP IN EACH EYE DAILY   OVER THE COUNTER MEDICATION Apply 1 application topically 2 (two) times daily as needed (pain). Menthol/M-Salicylate 10-15% topical cream   prednisoLONE acetate (PRED FORTE) 1 % ophthalmic suspension Place 1 drop into both eyes 2 (two) times daily.   Rivaroxaban (XARELTO PO) Take by mouth.   sacubitril-valsartan (ENTRESTO) 97-103 MG TAKE ONE-HALF TABLET BY MOUTH TWICE A DAY FOR HEART FAILURE   sildenafil (VIAGRA) 100 MG tablet Take 50 mg by mouth daily as needed for erectile dysfunction.   spironolactone (ALDACTONE) 25 MG tablet TAKE ONE TABLET BY MOUTH DAILY (FLUID PILL)   testosterone cypionate (DEPOTESTOSTERONE CYPIONATE) 200 MG/ML injection INJECT 0.3 MLS INTRAMUSCULARLY EVERY 14 DAYS   timolol (TIMOPTIC-XR) 0.5 % ophthalmic gel-forming Place 1 drop into both eyes daily.   traMADol (ULTRAM) 50 MG tablet Take 50 mg by mouth 3 (three) times daily as needed for moderate pain. Maximum dose= 8 tablets per day For pain   urea 10 % lotion APPLY THIN LAYER TO AFFECTED AREA DAILY TO DRY SKIN /CALLUS REGIONS OF BOTH FEET   valACYclovir (VALTREX) 1000 MG tablet Take 1 tablet by mouth daily.      Allergies:   No Known Allergies  Social History   Tobacco Use   Smoking status: Former    Packs/day: 0.12    Years: 4.00    Pack years: 0.48    Types: Cigarettes   Smokeless tobacco: Never   Tobacco comments:    08/21/2017 "stopped in the 1990s"  Vaping Use   Vaping Use: Never used  Substance Use Topics   Alcohol use: Yes    Comment: 08/31/2017 "might have a margarita once/month; if that"   Drug use: Never     Family History: Family History  Problem Relation Age of Onset   Breast cancer Mother    Multiple sclerosis Father    Multiple sclerosis Brother    Diabetes Brother      ROS:  Please see the history of present illness.    All other systems reviewed and are negative.  EKGs/Labs/Other Studies Reviewed:    The following studies were reviewed today:  EKG:  10/22 SR with PVCs  Imaging studies that I have independently reviewed today:   Echocardiogram November 2021 shows an ejection fraction of 40 to 45% with mild mitral regurgitation normal right ventricular function  PET study November 2020 demonstrated diffuse myocardial uptake without abnormality on rest perfusion imaging suggesting failure to suppress normal cardiac mild cardio glucose uptake.  Avid mediastinal and hilar node uptake reflecting known history of sarcoidosis with no evidence of extrathoracic sarcoidosis  Cardiac MRI October 2020 demonstrates ejection fraction 50% with a thin rim of subendocardial scar seen in the basal segments diffusely.  Additional small subsegmental infarct is seen in the distal inferoseptum.  Papillary muscle infarction is also noted.  Late gadolinium enhancement suggestive of sarcoid with vascular involvement given the patient's history of pulmonary sarcoid with diffuse subendocardial scar noted in the basal segments with small segmental infarction in the distal inferoseptum with papillary muscle infarction.  These abnormalities extend beyond a single coronary territory and well  atypical are suggestive of cardiac involvement of sarcoidosis  Nuclear stress test September 2022 with fixed inferior defect of small area of anterior lateral and lateral reversible ischemia  Zio patch February 2022 demonstrated PVC burden of 2.7%; patient was started on metoprolol but stopped this due to erectile dysfunction and fatigue.    Recent Labs: 12/10/2020: ALT 26; BUN 15; Creatinine, Ser 1.60; Hemoglobin 20.5; Platelets 165; Potassium 4.4; Sodium 134  Recent Lipid Panel No results found for: CHOL, TRIG, HDL, CHOLHDL, VLDL, LDLCALC, LDLDIRECT  Physical Exam:    VS:  BP 114/76   Pulse 79   Ht 5\' 10"  (1.778 m)   Wt 243 lb 12.8 oz (110.6 kg)   SpO2 96%   BMI 34.98 kg/m     Wt Readings from Last 5 Encounters:  02/07/21 243 lb 12.8 oz (110.6 kg)  12/10/20 250 lb (113.4 kg)  06/05/20 246 lb (111.6 kg)  05/29/20 246 lb (111.6 kg)  03/27/20 246 lb (111.6 kg)    GENERAL:  No apparent distress, AOx3 HEENT:  No carotid bruits, +2 carotid impulses, no scleral icterus CAR: RRR 2/6 systolic murmur RES:  Clear to auscultation bilaterally ABD:  Soft, nontender, nondistended, positive bowel sounds x 4 VASC:  +2 radial pulses, +2 carotid pulses, palpable pedal pulses NEURO:  CN 2-12 grossly intact; motor and sensory grossly intact PSYCH:  No active depression or anxiety EXT:  No edema, ecchymosis, or cyanosis  ASSESSMENT:    1. Chest pain of uncertain etiology   2. Sarcoidosis   3. Hypertension, unspecified type   4. PVC's (premature ventricular contractions)    PLAN:    Chest pain of uncertain etiology - Plan: ECHOCARDIOGRAM COMPLETE The patient had a single episode of chest pain and has had no recurrent chest pain.  His stress test was very low risk.  We will continue to monitor for now.  I will see him back in 6 months or earlier if needed.  I will obtain an echocardiogram to evaluate his ejection fraction and MR.  Sarcoidosis Continue current treatment.  Hypertension,  unspecified type His blood pressure is well controlled on his current regimen.   PVCs Will obtain echocardiogram, he has occasional PVCs but no syncope.  We will evaluate for mitral annulus disjunction.  Shared Decision Making/Informed Consent:  Medication Adjustments/Labs and Tests Ordered: Current medicines are reviewed at length with the patient today.  Concerns regarding medicines are outlined above.   Orders Placed This Encounter  Procedures   ECHOCARDIOGRAM COMPLETE     No orders of the defined types were placed in this encounter.   Patient Instructions  Medication Instructions:  Your physician recommends that you continue on your current medications as directed. Please refer to the Current Medication list given to you today.  *If you need a refill on your cardiac medications before your next appointment, please call your pharmacy*   Lab Work: None ordered  If you have labs (blood work) drawn today and your tests are completely normal, you will receive your results only by: MyChart Message (if you have MyChart) OR A paper copy in the mail If you have any lab test that is abnormal or we need to change your treatment, we will call you to review the results.   Testing/Procedures: Your physician has requested that you have an echocardiogram. Echocardiography is a painless test that uses sound waves to create images of your heart. It provides your doctor with information about the size and shape of your heart and how well your heart's chambers and valves are working. This procedure takes approximately one hour. There are no restrictions for this procedure.    Your next appointment:   6 month(s)  The format for your next appointment:   In Person  Provider:   Orbie Pyo, MD     Other Instructions

## 2021-02-07 ENCOUNTER — Encounter: Payer: Self-pay | Admitting: Internal Medicine

## 2021-02-07 ENCOUNTER — Ambulatory Visit (INDEPENDENT_AMBULATORY_CARE_PROVIDER_SITE_OTHER): Payer: No Typology Code available for payment source | Admitting: Internal Medicine

## 2021-02-07 ENCOUNTER — Other Ambulatory Visit: Payer: Self-pay

## 2021-02-07 VITALS — BP 114/76 | HR 79 | Ht 70.0 in | Wt 243.8 lb

## 2021-02-07 DIAGNOSIS — D869 Sarcoidosis, unspecified: Secondary | ICD-10-CM | POA: Diagnosis not present

## 2021-02-07 DIAGNOSIS — I493 Ventricular premature depolarization: Secondary | ICD-10-CM

## 2021-02-07 DIAGNOSIS — I10 Essential (primary) hypertension: Secondary | ICD-10-CM | POA: Diagnosis not present

## 2021-02-07 DIAGNOSIS — R079 Chest pain, unspecified: Secondary | ICD-10-CM | POA: Diagnosis not present

## 2021-02-07 NOTE — Patient Instructions (Addendum)
Medication Instructions:  Your physician recommends that you continue on your current medications as directed. Please refer to the Current Medication list given to you today.  *If you need a refill on your cardiac medications before your next appointment, please call your pharmacy*   Lab Work: None ordered  If you have labs (blood work) drawn today and your tests are completely normal, you will receive your results only by: MyChart Message (if you have MyChart) OR A paper copy in the mail If you have any lab test that is abnormal or we need to change your treatment, we will call you to review the results.   Testing/Procedures: Your physician has requested that you have an echocardiogram. Echocardiography is a painless test that uses sound waves to create images of your heart. It provides your doctor with information about the size and shape of your heart and how well your heart's chambers and valves are working. This procedure takes approximately one hour. There are no restrictions for this procedure.    Your next appointment:   6 month(s)  The format for your next appointment:   In Person  Provider:   Orbie Pyo, MD     Other Instructions

## 2021-03-04 ENCOUNTER — Other Ambulatory Visit: Payer: Self-pay

## 2021-03-04 ENCOUNTER — Ambulatory Visit (HOSPITAL_COMMUNITY): Payer: Medicare Other | Attending: Cardiovascular Disease

## 2021-03-04 DIAGNOSIS — R079 Chest pain, unspecified: Secondary | ICD-10-CM

## 2021-03-04 LAB — ECHOCARDIOGRAM COMPLETE
Area-P 1/2: 4.22 cm2
MV M vel: 5.18 m/s
MV Peak grad: 107.1 mmHg
Radius: 0.7 cm
S' Lateral: 4.5 cm

## 2021-03-13 ENCOUNTER — Telehealth: Payer: Self-pay | Admitting: Internal Medicine

## 2021-03-13 NOTE — Telephone Encounter (Signed)
Richard Shepherd from [facility] is calling for appt notes for New Patient appt on 02/07/22... please advise.. fax# [phone]

## 2021-08-26 NOTE — Progress Notes (Deleted)
Cardiology Office Note:    Date:  08/26/2021   ID:  Richard Shepherd, DOB 06/02/1965, MRN [id number]  PCP[facility]  [facility]   [facility] Providers Cardiologist:  Alverda Skeans, MD Referring MD: [facility]   Chief Complaint/Reason for Referral: Follow-up  ASSESSMENT:    1. Sarcoidosis   2. Hypertension, unspecified type   3. Stage 3 chronic kidney disease, unspecified whether stage 3a or 3b CKD (HCC)     PLAN:    In order of problems listed above: 1.  Sarcoidosis I viewed the images of the patient's CT TEE recently.  What was read out as ejection fraction 50 to 55% no contrast enhancement was used normal motion seems more abnormal than this.  I will perform echocardiogram with contrast to evaluate the patient's ejection fraction for more detailed way.  If it is abnormal we may need to proceed to coronary angiography study to better evaluate. 2.  Hypertension: 3.  Stage III chronic kidney disease:         {Are you ordering a CV Procedure (e.g. stress test, cath, DCCV, TEE, etc)?   Press F2        :[id number]}   Dispo:  No follow-ups on file.      Medication Adjustments/Labs and Tests Ordered: Current medicines are reviewed at length with the patient today.  Concerns regarding medicines are outlined above.  The following changes have been made:  {PLAN; NO CHANGE:13088:s}   Labs/tests ordered: No orders of the defined types were placed in this encounter.   Medication Changes: No orders of the defined types were placed in this encounter.    Current medicines are reviewed at length with the patient today.  The patient {ACTIONS; HAS/DOES NOT HAVE:19233} concerns regarding medicines.   History of Present Illness:    FOCUSED PROBLEM LIST:   1.  Sarcoidosis with known pulmonary, ophthalmic, and cardiac involvement with cardiac MRI in 2020 demonstrating inferoseptal and papillary muscle late gadolinium enhancement; ejection fraction 50% 2.   Hypertension 3.  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  Chronic kidney disease with a creatinine of around 1.6-1.8  The patient is a 56 y.o. male with the indicated medical history here for follow-up.  December 2022: The patient was seen to establish cardiovascular care.  He was having no significant chest pain and was exercising moderately 3 days a week.    Today: In the interim the patient had an echocardiogram which demonstrated preserved function with ejection fraction 50 to 55% with LV dilatation and multiple wall motion abnormalities.         Current Medications: No outpatient medications have been marked as taking for the 08/29/21 encounter (Appointment) with Orbie Pyo, MD.     Allergies:    Patient has no known allergies.   Social History:   Social History   Tobacco Use   Smoking status: Former    Packs/day: 0.12    Years: 4.00    Total pack years: 0.48    Types: Cigarettes   Smokeless tobacco: Never   Tobacco comments:    08/21/2017 "stopped in the 1990s"  Vaping Use   Vaping Use: Never used  Substance Use Topics   Alcohol use: Yes    Comment: 08/31/2017 "might have a margarita once/month; if that"   Drug use: Never     Family Hx: Family History  Problem Relation Age of Onset   Breast cancer Mother    Multiple sclerosis Father    Multiple  sclerosis Brother    Diabetes Brother      Review of Systems:   Please see the history of present illness.    All other systems reviewed and are negative.     EKGs/Labs/Other Test Reviewed:    EKG:  EKG performed October 2022 that I personally reviewed demonstrates sinus rhythm   Prior CV studies:  TTE December 2022 demonstrates ejection fraction of 50 to 55% with hypokinesis of basal inferior, basal inferoseptum, basal anteroseptum, basal anterior, mid apical anterolateral and basal to mid inferolateral myocardium.  There is moderate LV dilatation and mild to moderate mitral regurgitation  OSH  studies: Echocardiogram November 2021 shows an ejection fraction of 40 to 45% with mild mitral regurgitation normal right ventricular function   PET study November 2020 demonstrated diffuse myocardial uptake without abnormality on rest perfusion imaging suggesting failure to suppress normal cardiac mild cardio glucose uptake.  Avid mediastinal and hilar node uptake reflecting known history of sarcoidosis with no evidence of extrathoracic sarcoidosis   Cardiac MRI October 2020 demonstrates ejection fraction 50% with a thin rim of subendocardial scar seen in the basal segments diffusely.  Additional small subsegmental infarct is seen in the distal inferoseptum.  Papillary muscle infarction is also noted.  Late gadolinium enhancement suggestive of sarcoid with vascular involvement given the patient's history of pulmonary sarcoid with diffuse subendocardial scar noted in the basal segments with small segmental infarction in the distal inferoseptum with papillary muscle infarction.  These abnormalities extend beyond a single coronary territory and well atypical are suggestive of cardiac involvement of sarcoidosis   Nuclear stress test September 2022 with fixed inferior defect of small area of anterior lateral and lateral reversible ischemia   Zio patch February 2022 demonstrated PVC burden of 2.7%; patient was started on metoprolol but stopped this due to erectile dysfunction and fatigue.  Other studies Reviewed: Review of the additional studies/records demonstrates: None relevant  Recent Labs: 12/10/2020: ALT 26; BUN 15; Creatinine, Ser 1.60; Hemoglobin 20.5; Platelets 165; Potassium 4.4; Sodium 134   Recent Lipid Panel No results found for: "CHOL", "TRIG", "HDL", "LDLCALC", "LDLDIRECT"  Risk Assessment/Calculations:    {Does this patient have ATRIAL FIBRILLATION?[phone]}      Physical Exam:    VS:  There were no vitals taken for this visit.   Wt Readings from Last 3 Encounters:  02/07/21  243 lb 12.8 oz (110.6 kg)  12/10/20 250 lb (113.4 kg)  06/05/20 246 lb (111.6 kg)    GENERAL:  No apparent distress, AOx3 HEENT:  No carotid bruits, +2 carotid impulses, no scleral icterus CAR: RRR Irregular RR*** no murmurs***, gallops, rubs, or thrills RES:  Clear to auscultation bilaterally ABD:  Soft, nontender, nondistended, positive bowel sounds x 4 VASC:  +2 radial pulses, +2 carotid pulses, palpable pedal pulses NEURO:  CN 2-12 grossly intact; motor and sensory grossly intact PSYCH:  No active depression or anxiety EXT:  No edema, ecchymosis, or cyanosis  Signed, Orbie Pyo, MD  08/26/2021 1:00 PM    [facility] [address] [place], [place], [place]  [address] Phone: [phone]; Fax: [phone]   Note:  This document was prepared using Dragon voice recognition software and may include unintentional dictation errors.

## 2021-08-29 ENCOUNTER — Ambulatory Visit: Payer: No Typology Code available for payment source | Admitting: Internal Medicine

## 2021-09-01 NOTE — Progress Notes (Addendum)
Cardiology Office Note:    Date:  09/04/2021   ID:  Orvis Stann, DOB 1966/01/25, MRN [id number]  PCP:  [facility] Cardiologist:  Lenna Sciara, MD Referring MD: [facility]   Chief Complaint/Reason for Referral:  Cardiology follow up  ASSESSMENT:    1. Ventricular tachycardia (HCC)   2. Sarcoidosis   3. Hypertension, unspecified type   4. Dilated cardiomyopathy ([place])     PLAN:    In order of problems listed above: 1.  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  I will start prednisone 60 mg daily.  I will start Toprol-XL 25 mg daily.  He will continue Jardiance 25 mg daily, spironolactone 25 mg daily, and Entresto.  I will obtain an echocardiogram here and try to obtain his echocardiogram report demonstrating LV dysfunction.  I will refer him to electrophysiology for further evaluation.  I did speak with Dr. Quentin Ore who will try to see him in an expedited fashion.  Unfortunately we are not able to do a PET scan for sarcoid here.  I think it makes sense to go ahead and empirically treat him with steroids in addition to his Humira.  I will check a CMP and CBC today as well.  Follow-up with me in 3 months or earlier if needed.  Addendum: I was able to obtain echocardiogram report from his outside facility which demonstrated ejection fraction of 25 to 30% with anterior apical hypokinesis.  This will be scanned into our system.  I will refer the patient for coronary angiography and right heart catheterization study due to his new cardiomyopathy. 2.  Sarcoidosis: See discussion above.. 3.  Hypertension: Blood pressures well controlled however I am adding Toprol XL to optimized his  HF regimen. 4.  Cardiomyopathy: Apparently the patient had an echocardiogram done recently which demonstrated severe LV dysfunction.  I will repeat an echocardiogram here and try to obtain that report.  Add Toprol XL as above, continue Jardiance, Entresto, and spironolactone.   Dispo:  Return in about 3 months (around 12/05/2021).     Medication Adjustments/Labs and Tests Ordered: Current medicines are reviewed at length with the patient today.  Concerns regarding medicines are outlined above.  The following changes have been made:     Labs/tests ordered: Orders Placed This Encounter  Procedures   Comp Met (CMET)   CBC   Ambulatory referral to Cardiac Electrophysiology   ECHOCARDIOGRAM COMPLETE    Medication Changes: Meds ordered this encounter  Medications   predniSONE (DELTASONE) 20 MG tablet    Sig: Take 3 tablets (60 mg total) by mouth daily with breakfast.    Dispense:  90 tablet    Refill:  3   DISCONTD: metoprolol succinate (TOPROL XL) 25 MG 24 hr tablet    Sig: Take 1 tablet (25 mg total) by mouth daily.    Dispense:  30 tablet    Refill:  3   metoprolol succinate (TOPROL XL) 25 MG 24 hr tablet    Sig: Take 1 tablet (25 mg total) by mouth at bedtime.    Dispense:  30 tablet    Refill:  3   metoprolol succinate (TOPROL XL) 25 MG 24 hr tablet    Sig: Take 1 tablet (25  mg total) by mouth at bedtime.    Dispense:  90 tablet    Refill:  3   predniSONE (DELTASONE) 20 MG tablet    Sig: Take 3 tablets (60 mg total) by mouth daily with breakfast.    Dispense:  270 tablet    Refill:  3     Current medicines are reviewed at length with the patient today.  The patient does not have concerns regarding medicines.   History of Present Illness:    FOCUSED PROBLEM LIST:   1.  Sarcoidosis with known pulmonary, ophthalmic, and cardiac involvement with cardiac MRI in 2020 demonstrating inferoseptal and papillary muscle late gadolinium enhancement; ejection fraction 50% 2.   Hypertension 3.  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  Chronic kidney disease stage III  The patient is a 56 y.o. male with the indicated medical history here for follow up.  December 2022: Patient was seen for initial consultation regarding chest pain of uncertain etiology.  He had a very low risk stress test and his chest pain occurred only 1 time.  He had no recurrent chest pain and was exercising moderately without any issues.  He was referred for an echocardiogram.  Today: In the interim an echocardiogram demonstrated preserved LV function.  Apparently the patient had an echocardiogram at the [place] which demonstrated severe LV dysfunction.  I do not have that report to review but he was started on Jardiance, Entresto, and spironolactone.  He did have a ZIO monitor done in May which demonstrated frequent ventricular ectopy with nonsustained ventricular tachycardia (19% burden over 7 day monitoring period).  The patient denies any shortness of breath however he has had some presyncope maybe twice within the last week.  He has had palpitations that have been more frequent versus when I saw him last time.  He denies any paroxysmal nocturnal dyspnea, orthopnea, signs or symptoms of stroke, or severe bleeding.  He has noticed occasional blood in his stool but he does have hemorrhoids.  He has not required emergency room visits or hospitalizations.  Current Medications: Current Meds  Medication Sig   acetaminophen (TYLENOL) 500 MG tablet Take 1,000 mg by mouth 4 (four) times daily as needed for mild pain.   Adalimumab 40 MG/0.4ML PNKT INJECT 40MG (0.4ML) SUBCUTANEOUSLY EVERY OTHER WEEK   albuterol (PROVENTIL HFA;VENTOLIN HFA) 108 (90 Base) MCG/ACT inhaler Inhale 1-2 puffs into the lungs every 6 (six) hours as needed for wheezing or shortness of breath.   atropine 1 % ophthalmic solution Place 1 drop into the left eye 2 (two) times daily.   atropine 1 % ophthalmic solution Place 1 drop into both eyes 2  times a day for three days each week.   bisacodyl (DULCOLAX) 5 MG EC tablet Take 5 mg by mouth 2 (two) times daily as needed for mild constipation or moderate constipation.   brimonidine (ALPHAGAN) 0.2 % ophthalmic solution Place 1 drop into both eyes 2 (two) times daily.    calcium-vitamin D (OSCAL WITH D) 500-200 MG-UNIT per tablet Take 1 tablet by mouth daily with breakfast.   capsaicin (ZOSTRIX) 0.025 % cream Apply 1 application topically 2 (two) times daily as needed (skin care).    carboxymethylcellulose (REFRESH PLUS) 0.5 % SOLN Place 1 drop into both eyes 2 (two) times daily.   Cholecalciferol 50 MCG (2000 UT) TABS TAKE ONE TABLET BY MOUTH DAILY *NOTE CHANGE IN STRENGTH*   cyclobenzaprine (FLEXERIL) 10 MG tablet Take 10 mg by mouth every 8 (eight)  hours as needed for muscle spasms.   cycloSPORINE (RESTASIS) 0.05 % ophthalmic emulsion INSTILL 1 DROP IN EACH EYE EVERY 12 HOURS   diclofenac Sodium (VOLTAREN) 1 % GEL APPLY 2 GRAMS TO AFFECTED AREA TWICE A DAY AS NEEDED   diphenhydrAMINE (BENADRYL) 50 MG capsule Take 50 mg by mouth at bedtime.   empagliflozin (JARDIANCE) 25 MG TABS tablet TAKE ONE-HALF TABLET BY MOUTH ONCE DAILY FOR HEART FAILURE   furosemide (LASIX) 20 MG tablet take 1 tablet by mouth once daily   gabapentin (NEURONTIN) 400 MG capsule Take 3 capsules by mouth at bedtime.   gabapentin (NEURONTIN) 600 MG tablet Take 1,200 mg by mouth at bedtime.   homatropine 2 % ophthalmic solution Place 2 drops into both eyes 2 (two) times daily.   HYDROcodone-acetaminophen (NORCO/VICODIN) 5-325 MG tablet Take 1-2 tablets by mouth every 4 (four) hours as needed.   metoprolol succinate (TOPROL XL) 25 MG 24 hr tablet Take 1 tablet (25 mg total) by mouth at bedtime.   mometasone (ASMANEX) 220 MCG/INH inhaler Inhale 2 puffs into the lungs daily as needed (SIB).    mycophenolate (CELLCEPT) 500 MG tablet Take 600 mg by mouth 2 (two) times daily.    nepafenac (ILEVRO) 0.3 % ophthalmic suspension  INSTILL 1 DROP IN EACH EYE DAILY   OVER THE COUNTER MEDICATION Apply 1 application topically 2 (two) times daily as needed (pain). Menthol/M-Salicylate 54-98% topical cream   prednisoLONE acetate (PRED FORTE) 1 % ophthalmic suspension Place 1 drop into both eyes 2 (two) times daily.   predniSONE (DELTASONE) 20 MG tablet Take 3 tablets (60 mg total) by mouth daily with breakfast.   predniSONE (DELTASONE) 20 MG tablet Take 3 tablets (60 mg total) by mouth daily with breakfast.   Rivaroxaban (XARELTO PO) Take by mouth.   sacubitril-valsartan (ENTRESTO) 97-103 MG TAKE ONE-HALF TABLET BY MOUTH TWICE A DAY FOR HEART FAILURE   sildenafil (VIAGRA) 100 MG tablet Take 50 mg by mouth daily as needed for erectile dysfunction.   spironolactone (ALDACTONE) 25 MG tablet TAKE ONE TABLET BY MOUTH DAILY (FLUID PILL)   testosterone cypionate (DEPOTESTOSTERONE CYPIONATE) 200 MG/ML injection INJECT 0.3 MLS INTRAMUSCULARLY EVERY 14 DAYS   timolol (TIMOPTIC-XR) 0.5 % ophthalmic gel-forming Place 1 drop into both eyes daily.   traMADol (ULTRAM) 50 MG tablet Take 50 mg by mouth 3 (three) times daily as needed for moderate pain. Maximum dose= 8 tablets per day For pain   urea 10 % lotion APPLY THIN LAYER TO AFFECTED AREA DAILY TO DRY SKIN /CALLUS REGIONS OF BOTH FEET   valACYclovir (VALTREX) 1000 MG tablet Take 1,000 mg by mouth daily.   valACYclovir (VALTREX) 1000 MG tablet Take 1 tablet by mouth daily.   [DISCONTINUED] metoprolol succinate (TOPROL XL) 25 MG 24 hr tablet Take 1 tablet (25 mg total) by mouth daily.     Allergies:    Patient has no known allergies.   Social History:   Social History   Tobacco Use   Smoking status: Former    Packs/day: 0.12    Years: 4.00    Total pack years: 0.48    Types: Cigarettes   Smokeless tobacco: Never   Tobacco comments:    08/21/2017 "stopped in the 1990s"  Vaping Use   Vaping Use: Never used  Substance Use Topics   Alcohol use: Yes    Comment: 08/31/2017 "might  have a margarita once/month; if that"   Drug use: Never     Family Hx: Family History  Problem Relation Age of Onset   Breast cancer Mother    Multiple sclerosis Father    Multiple sclerosis Brother    Diabetes Brother      Review of Systems:   Please see the history of present illness.    All other systems reviewed and are negative.    EKGs/Labs/Other Test Reviewed:    EKG:  10/22 SR with PVCs; EKG today demonstrates sinus rhythm with first-degree AV block and occasional PVCs with a left anterior fascicular block.   Imaging studies that I have independently reviewed today:   Echocardiogram December 2022 demonstrates ejection fraction of 55% with hypokinesis of basal inferior, basal inferoseptum, basal anterior septum, basal anterior, mid apical with anterolateral basal to mid inferolateral myocardium with mild to moderate mitral valve regurgitation   Echocardiogram November 2021 shows an ejection fraction of 40 to 45% with mild mitral regurgitation normal right ventricular function   PET study November 2020 demonstrated diffuse myocardial uptake without abnormality on rest perfusion imaging suggesting failure to suppress normal cardiac mild cardio glucose uptake.  Avid mediastinal and hilar node uptake reflecting known history of sarcoidosis with no evidence of extrathoracic sarcoidosis   Cardiac MRI October 2020 demonstrates ejection fraction 50% with a thin rim of subendocardial scar seen in the basal segments diffusely.  Additional small subsegmental infarct is seen in the distal inferoseptum.  Papillary muscle infarction is also noted.  Late gadolinium enhancement suggestive of sarcoid with vascular involvement given the patient's history of pulmonary sarcoid with diffuse subendocardial scar noted in the basal segments with small segmental infarction in the distal inferoseptum with papillary muscle infarction.  These abnormalities extend beyond a single coronary territory and well  atypical are suggestive of cardiac involvement of sarcoidosis   Nuclear stress test September 2022 with fixed inferior defect of small area of anterior lateral and lateral reversible ischemia   Zio patch February 2022 demonstrated PVC burden of 2.7%; patient was started on metoprolol but stopped this due to erectile dysfunction and fatigue.  Other studies Reviewed: Review of the additional studies/records demonstrates: None relevant  Recent Labs: 12/10/2020: ALT 26; BUN 15; Creatinine, Ser 1.60; Hemoglobin 20.5; Platelets 165; Potassium 4.4; Sodium 134   Recent Lipid Panel No results found for: "CHOL", "TRIG", "HDL", "LDLCALC", "LDLDIRECT"  Risk Assessment/Calculations:           Physical Exam:    VS:  BP 120/72   Pulse 88   Ht '5\' 11"'  (1.803 m)   Wt 241 lb 9.6 oz (109.6 kg)   SpO2 97%   BMI 33.70 kg/m    Wt Readings from Last 3 Encounters:  09/04/21 241 lb 9.6 oz (109.6 kg)  02/07/21 243 lb 12.8 oz (110.6 kg)  12/10/20 250 lb (113.4 kg)    GENERAL:  No apparent distress, AOx3 HEENT:  No carotid bruits, +2 carotid impulses, no scleral icterus CAR: RRR no murmurs, gallops, rubs, or thrills RES:  Clear to auscultation bilaterally ABD:  Soft, nontender, nondistended, positive bowel sounds x 4 VASC:  +2 radial pulses, +2 carotid pulses, palpable pedal pulses NEURO:  CN 2-12 grossly intact; motor and sensory grossly intact PSYCH:  No active depression or anxiety EXT:  No edema, ecchymosis, or cyanosis  Signed, Early Osmond, MD  09/04/2021 5:04 PM    [facility] [address], [place], [place]  [address] Phone: [phone]; Fax: [phone]   Note:  This document was prepared using Dragon voice recognition software and may include  unintentional dictation errors.

## 2021-09-04 ENCOUNTER — Ambulatory Visit (INDEPENDENT_AMBULATORY_CARE_PROVIDER_SITE_OTHER): Payer: Medicare Other | Admitting: Internal Medicine

## 2021-09-04 ENCOUNTER — Encounter: Payer: Self-pay | Admitting: Internal Medicine

## 2021-09-04 VITALS — BP 120/72 | HR 88 | Ht 71.0 in | Wt 241.6 lb

## 2021-09-04 DIAGNOSIS — I10 Essential (primary) hypertension: Secondary | ICD-10-CM | POA: Diagnosis not present

## 2021-09-04 DIAGNOSIS — D869 Sarcoidosis, unspecified: Secondary | ICD-10-CM

## 2021-09-04 DIAGNOSIS — I420 Dilated cardiomyopathy: Secondary | ICD-10-CM

## 2021-09-04 DIAGNOSIS — I472 Ventricular tachycardia, unspecified: Secondary | ICD-10-CM | POA: Diagnosis not present

## 2021-09-04 DIAGNOSIS — R079 Chest pain, unspecified: Secondary | ICD-10-CM

## 2021-09-04 MED ORDER — METOPROLOL SUCCINATE ER 25 MG PO TB24: 25.0000 mg | ORAL_TABLET | Freq: Every day | ORAL | 3 refills | Status: DC

## 2021-09-04 MED ORDER — PREDNISONE 20 MG PO TABS: 60.0000 mg | ORAL_TABLET | Freq: Every day | ORAL | 3 refills | Status: DC

## 2021-09-04 NOTE — Addendum Note (Signed)
Addended by: Anselm Pancoast R on: 09/04/2021 05:06 PM   Modules accepted: Orders

## 2021-09-04 NOTE — Patient Instructions (Signed)
Medication Instructions:  Your physician has recommended you make the following change in your medication:  Start Toprol XL 25mg  daily at bedtime. Start Prednisone 60mg  (3 tabs) Daily.  *If you need a refill on your cardiac medications before your next appointment, please call your pharmacy*   Lab Work: Bmet and CBC If you have labs (blood work) drawn today and your tests are completely normal, you will receive your results only by: MyChart Message (if you have MyChart) OR A paper copy in the mail If you have any lab test that is abnormal or we need to change your treatment, we will call you to review the results.   Testing/Procedures: ECHO   Follow-Up: At [facility], you and your health needs are our priority.  As part of our continuing mission to provide you with exceptional heart care, we have created designated Provider Care Teams.  These Care Teams include your primary Cardiologist (physician) and Advanced Practice Providers (APPs -  Physician Assistants and Nurse Practitioners) who all work together to provide you with the care you need, when you need it.  We recommend signing up for the patient portal called "MyChart".  Sign up information is provided on this After Visit Summary.  MyChart is used to connect with patients for Virtual Visits (Telemedicine).  Patients are able to view lab/test results, encounter notes, upcoming appointments, etc.  Non-urgent messages can be sent to your provider as well.   To learn more about what you can do with MyChart, go to .    Your next appointment:   We are working on an appointment for you.   If primary card or EP is not listed click here to update    :1}    Other Instructions Your physician has requested that you have an echocardiogram. Echocardiography is a painless test that uses sound waves to create images of your heart. It provides your doctor with information about the size and shape of your heart and how  well your heart's chambers and valves are working. This procedure takes approximately one hour. There are no restrictions for this procedure.   Important Information About Sugar

## 2021-09-05 ENCOUNTER — Other Ambulatory Visit: Payer: Self-pay | Admitting: *Deleted

## 2021-09-05 ENCOUNTER — Ambulatory Visit (HOSPITAL_COMMUNITY): Payer: Medicare Other | Attending: Internal Medicine

## 2021-09-05 DIAGNOSIS — R079 Chest pain, unspecified: Secondary | ICD-10-CM

## 2021-09-05 DIAGNOSIS — D869 Sarcoidosis, unspecified: Secondary | ICD-10-CM

## 2021-09-05 DIAGNOSIS — I472 Ventricular tachycardia, unspecified: Secondary | ICD-10-CM | POA: Insufficient documentation

## 2021-09-05 DIAGNOSIS — I420 Dilated cardiomyopathy: Secondary | ICD-10-CM

## 2021-09-05 LAB — CBC
Hematocrit: 56.7 % — ABNORMAL HIGH (ref 37.5–51.0)
Hemoglobin: 19.5 g/dL — ABNORMAL HIGH (ref 13.0–17.7)
MCH: 33.9 pg — ABNORMAL HIGH (ref 26.6–33.0)
MCHC: 34.4 g/dL (ref 31.5–35.7)
MCV: 99 fL — ABNORMAL HIGH (ref 79–97)
Platelets: 231 10*3/uL (ref 150–450)
RBC: 5.75 x10E6/uL (ref 4.14–5.80)
RDW: 13.1 % (ref 11.6–15.4)
WBC: 4.1 10*3/uL (ref 3.4–10.8)

## 2021-09-05 LAB — COMPREHENSIVE METABOLIC PANEL
ALT: 27 IU/L (ref 0–44)
AST: 24 IU/L (ref 0–40)
Albumin/Globulin Ratio: 1.4 (ref 1.2–2.2)
Albumin: 4.4 g/dL (ref 3.8–4.9)
Alkaline Phosphatase: 64 IU/L (ref 44–121)
BUN/Creatinine Ratio: 11 (ref 9–20)
BUN: 16 mg/dL (ref 6–24)
Bilirubin Total: 0.7 mg/dL (ref 0.0–1.2)
CO2: 22 mmol/L (ref 20–29)
Calcium: 10 mg/dL (ref 8.7–10.2)
Chloride: 100 mmol/L (ref 96–106)
Creatinine, Ser: 1.52 mg/dL — ABNORMAL HIGH (ref 0.76–1.27)
Globulin, Total: 3.1 g/dL (ref 1.5–4.5)
Glucose: 87 mg/dL (ref 70–99)
Potassium: 4.7 mmol/L (ref 3.5–5.2)
Sodium: 138 mmol/L (ref 134–144)
Total Protein: 7.5 g/dL (ref 6.0–8.5)
eGFR: 54 mL/min/{1.73_m2} — ABNORMAL LOW (ref >59)

## 2021-09-05 LAB — ECHOCARDIOGRAM COMPLETE
Area-P 1/2: 3.89 cm2
MV M vel: 4.95 m/s
MV Peak grad: 98 mmHg
S' Lateral: 4.5 cm

## 2021-09-05 NOTE — Progress Notes (Signed)
Cardiac CTA order placed.

## 2021-09-08 ENCOUNTER — Other Ambulatory Visit (HOSPITAL_COMMUNITY): Payer: Self-pay | Admitting: *Deleted

## 2021-09-08 ENCOUNTER — Telehealth: Payer: Self-pay | Admitting: Internal Medicine

## 2021-09-08 ENCOUNTER — Telehealth (HOSPITAL_COMMUNITY): Payer: Self-pay | Admitting: *Deleted

## 2021-09-08 ENCOUNTER — Telehealth: Payer: Self-pay

## 2021-09-08 DIAGNOSIS — I472 Ventricular tachycardia, unspecified: Secondary | ICD-10-CM

## 2021-09-08 DIAGNOSIS — D869 Sarcoidosis, unspecified: Secondary | ICD-10-CM

## 2021-09-08 DIAGNOSIS — I420 Dilated cardiomyopathy: Secondary | ICD-10-CM

## 2021-09-08 MED ORDER — METOPROLOL TARTRATE 100 MG PO TABS: ORAL_TABLET | ORAL | 0 refills | Status: DC

## 2021-09-08 NOTE — Telephone Encounter (Signed)
Spoke with pt and advised of recommendations as below per Dr Lynnette Caffey.  Pt verbalizes understanding and is agreeable to proceed.  Order placed.  I would like him to get a repeat limited echo with definity contrast re: assess LV function.  I am suspicious that we are not capturing his true LV function.  Repeat needs to be with definity contrast. Richard Pyo, MD

## 2021-09-08 NOTE — Telephone Encounter (Signed)
Reaching out to patient to offer assistance regarding upcoming cardiac imaging study; pt verbalizes understanding of appt date/time, parking situation and where to check in, pre-test NPO status and medications ordered, and verified current allergies; name and call back number provided for further questions should they arise  Larey Brick RN Navigator Cardiac Imaging [facility] [phone] office [phone] cell  Patient to take 100mg  metoprolol tartrate two hours prior to his cardiac CT scan and hold his diuretics. He is aware to arrive at 11:30am.

## 2021-09-08 NOTE — Telephone Encounter (Signed)
-----   Message from Orbie Pyo, MD sent at 09/07/2021 12:16 PM EDT ----- I would like him to get a repeat limited echo with definity contrast re: assess LV function.  I am suspicious that we are not capturing his true LV function.  Repeat needs to be with definity contrast.

## 2021-09-08 NOTE — Telephone Encounter (Signed)
Called patient about message. Patient stated he is trying to renew his community care with the [place]. Patient stated he has the [place] working on it, but he just wanted to have [place] aware.

## 2021-09-08 NOTE — Telephone Encounter (Signed)
Patient said that the [facility] wants Dr. Lynnette Caffey or nurse to give them a call in regards to patient's appt. Please call [phone] Ext. [phone]

## 2021-09-09 NOTE — Progress Notes (Unsigned)
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 male who presents for an evaluation of possible cardiac sarcoidosis.  At the request of Dr. Ali Lowe. Their medical history includes CKD 3, DVT, obstructive sleep apnea, chronic systolic heart failure, ocular sarcoid.  The patient was seen by Dr. Ali Lowe September 04, 2021 for nonsustained ventricular tachycardia and conduction abnormalities.  At that appointment, given high index of suspicion for cardiac sarcoidosis, the patient was started on steroids by mouth.  Prior to the last visit with Dr. Ali Lowe the patient had an echo at the [facility] which showed severe LV dysfunction.  At that time, the patient was started on Jardiance Entresto and spironolactone.  He had a ZIO monitor which showed a 19% burden over a 7-day monitoring.  Of PVCs.  He has had several presyncopal episodes including within the week prior to the appointment Dr. Ali Lowe.  Today he tells me that he has had several episodes where he feels like he "gets unplugged and plugged back in".  He never loses consciousness.  These episodes are brief and resolved without specific intervention.    Past Medical History:  Diagnosis Date   Arthritis    "all over" (08/31/2017)   Asthma    Chest pain    Chronic lower back pain    CKD (chronic kidney disease), stage III ([place])    Archie Endo 08/31/2017   DVT (deep venous thrombosis) ([place]) < & 06/2006   a. mainly affect R leg   Endocarditis 2018   Fibromyalgia    Heart murmur    OSA on CPAP    Pericarditis 2017   Positive cardiac stress test    Sarcoidosis    a. eye involvement only    Past  Surgical History:  Procedure Laterality Date   CATARACT EXTRACTION W/ INTRAOCULAR LENS  IMPLANT, BILATERAL Bilateral 2012   ENDOVENOUS ABLATION SAPHENOUS VEIN W/ LASER Right 05/29/2020   endovenous laser ablation right greater saphenous vein and stab phlebectomy > 20 incisions by Ruta Hinds MD    EYE SURGERY Bilateral 2015   scraped calcium from cornea   GLAUCOMA SURGERY Bilateral 2012    Current Medications: Current Meds  Medication Sig   acetaminophen (TYLENOL) 500 MG tablet Take 1,000 mg by mouth 4 (four) times daily as needed for mild pain.   Adalimumab 40 MG/0.4ML PNKT INJECT 40MG  (0.4ML) SUBCUTANEOUSLY EVERY OTHER WEEK   albuterol (PROVENTIL HFA;VENTOLIN HFA) 108 (90 Base) MCG/ACT inhaler Inhale 1-2 puffs into the lungs every 6 (six) hours as needed for wheezing or shortness of breath.   bisacodyl (DULCOLAX) 5 MG EC tablet Take 5 mg by mouth 2 (two) times daily as needed for mild constipation or moderate constipation.   brimonidine (ALPHAGAN) 0.2 % ophthalmic solution Place 1 drop into both eyes 2 (two) times daily.    calcium-vitamin D (OSCAL WITH D) 500-200 MG-UNIT per tablet Take 1 tablet by mouth daily with breakfast.   capsaicin (ZOSTRIX) 0.025 % cream Apply 1 application topically 2 (two) times daily as needed (skin care).    carboxymethylcellulose (REFRESH PLUS) 0.5 % SOLN Place 1 drop into both eyes 2 (two) times  daily.   Cholecalciferol 50 MCG (2000 UT) TABS TAKE ONE TABLET BY MOUTH DAILY *NOTE CHANGE IN STRENGTH*   cyclobenzaprine (FLEXERIL) 10 MG tablet Take 10 mg by mouth every 8 (eight) hours as needed for muscle spasms.   cycloSPORINE (RESTASIS) 0.05 % ophthalmic emulsion INSTILL 1 DROP IN EACH EYE EVERY 12 HOURS   diclofenac Sodium (VOLTAREN) 1 % GEL APPLY 2 GRAMS TO AFFECTED AREA TWICE A DAY AS NEEDED   diphenhydrAMINE (BENADRYL) 50 MG capsule Take 50 mg by mouth at bedtime.   empagliflozin (JARDIANCE) 25 MG TABS tablet TAKE ONE-HALF TABLET BY MOUTH ONCE DAILY FOR  HEART FAILURE   furosemide (LASIX) 20 MG tablet take 1 tablet by mouth once daily   gabapentin (NEURONTIN) 600 MG tablet Take 1,200 mg by mouth at bedtime.   homatropine 2 % ophthalmic solution Place 2 drops into both eyes 2 (two) times daily.   HYDROcodone-acetaminophen (NORCO/VICODIN) 5-325 MG tablet Take 1-2 tablets by mouth every 4 (four) hours as needed.   metoprolol succinate (TOPROL XL) 25 MG 24 hr tablet Take 1 tablet (25 mg total) by mouth at bedtime.   metoprolol succinate (TOPROL XL) 25 MG 24 hr tablet Take 1 tablet (25 mg total) by mouth at bedtime.   metoprolol tartrate (LOPRESSOR) 100 MG tablet Take tablet (100mg ) TWO hours prior to his cardiac CT scan.   mometasone (ASMANEX) 220 MCG/INH inhaler Inhale 2 puffs into the lungs daily as needed (SIB).    mycophenolate (CELLCEPT) 500 MG tablet Take 600 mg by mouth 2 (two) times daily.    nepafenac (ILEVRO) 0.3 % ophthalmic suspension INSTILL 1 DROP IN EACH EYE DAILY   OVER THE COUNTER MEDICATION Apply 1 application topically 2 (two) times daily as needed (pain). Menthol/M-Salicylate 10-15% topical cream   prednisoLONE acetate (PRED FORTE) 1 % ophthalmic suspension Place 1 drop into both eyes 2 (two) times daily.   predniSONE (DELTASONE) 20 MG tablet Take 3 tablets (60 mg total) by mouth daily with breakfast.   predniSONE (DELTASONE) 20 MG tablet Take 3 tablets (60 mg total) by mouth daily with breakfast.   Rivaroxaban (XARELTO PO) Take by mouth.   sacubitril-valsartan (ENTRESTO) 97-103 MG TAKE ONE-HALF TABLET BY MOUTH TWICE A DAY FOR HEART FAILURE   sildenafil (VIAGRA) 100 MG tablet Take 50 mg by mouth daily as needed for erectile dysfunction.   spironolactone (ALDACTONE) 25 MG tablet TAKE ONE TABLET BY MOUTH DAILY (FLUID PILL)   testosterone cypionate (DEPOTESTOSTERONE CYPIONATE) 200 MG/ML injection INJECT 0.3 MLS INTRAMUSCULARLY EVERY 14 DAYS   timolol (TIMOPTIC-XR) 0.5 % ophthalmic gel-forming Place 1 drop into both eyes daily.    traMADol (ULTRAM) 50 MG tablet Take 50 mg by mouth 3 (three) times daily as needed for moderate pain. Maximum dose= 8 tablets per day For pain   urea 10 % lotion APPLY THIN LAYER TO AFFECTED AREA DAILY TO DRY SKIN /CALLUS REGIONS OF BOTH FEET   valACYclovir (VALTREX) 1000 MG tablet Take 1,000 mg by mouth daily.   valACYclovir (VALTREX) 1000 MG tablet Take 1 tablet by mouth daily.     Allergies:   Patient has no known allergies.   Social History   Socioeconomic History   Marital status: Married    Spouse name: Not on file   Number of children: Not on file   Years of education: Not on file   Highest education level: Not on file  Occupational History   Not on file  Tobacco Use   Smoking status: Former  Packs/day: 0.12    Years: 4.00    Total pack years: 0.48    Types: Cigarettes   Smokeless tobacco: Never   Tobacco comments:    08/21/2017 "stopped in the 1990s"  Vaping Use   Vaping Use: Never used  Substance and Sexual Activity   Alcohol use: Yes    Comment: 08/31/2017 "might have a margarita once/month; if that"   Drug use: Never   Sexual activity: Yes  Other Topics Concern   Not on file  Social History Narrative   Not on file   Social Determinants of Health   Financial Resource Strain: Not on file  Food Insecurity: Not on file  Transportation Needs: Not on file  Physical Activity: Not on file  Stress: Not on file  Social Connections: Not on file     Family History: The patient's family history includes Breast cancer in his mother; Diabetes in his brother; Multiple sclerosis in his brother and father.  ROS:   Please see the history of present illness.    All other systems reviewed and are negative.  EKGs/Labs/Other Studies Reviewed:    The following studies were reviewed today:  October 2020 cardiac MRI (outside hospital) EF 50% Thin rim of subendocardial scar in the basal segments Papillary muscle infarction LGE suggestive of sarcoid with vascular  involvement  February 2022 Zio patch PVC burden 2.7%  November 2020 PET Diffuse myocardial uptake without abnormality on rest suggesting failure to suppress normal cardiac glucose uptake Avid mediastinal and hilar node uptake reflecting known history of sarcoidosis  September 05, 2021 echo LV function estimated is 50 to 55% RV function is normal No MR  I have reviewed a ZIO monitor from an outside hospital which shows a 19.1% PVC burden.  1 NSVT lasting 6 beats at a max rate of 182 bpm     Recent Labs: 09/04/2021: ALT 27; BUN 16; Creatinine, Ser 1.52; Hemoglobin 19.5; Platelets 231; Potassium 4.7; Sodium 138  Recent Lipid Panel No results found for: "CHOL", "TRIG", "HDL", "CHOLHDL", "VLDL", "LDLCALC", "LDLDIRECT"  Physical Exam:    VS:  BP 120/70 (BP Location: Left Arm, Patient Position: Sitting, Cuff Size: Large)   Pulse (!) 51   Ht 5\' 11"  (1.803 m)   Wt 243 lb 12.8 oz (110.6 kg)   SpO2 98%   BMI 34.00 kg/m     Wt Readings from Last 3 Encounters:  09/10/21 243 lb 12.8 oz (110.6 kg)  09/04/21 241 lb 9.6 oz (109.6 kg)  02/07/21 243 lb 12.8 oz (110.6 kg)     GEN:  Well nourished, well developed in no acute distress HEENT: Normal NECK: No JVD; No carotid bruits LYMPHATICS: No lymphadenopathy CARDIAC: RRR, no murmurs, rubs, gallops RESPIRATORY:  Clear to auscultation without rales, wheezing or rhonchi  ABDOMEN: Soft, non-tender, non-distended MUSCULOSKELETAL:  No edema; No deformity  SKIN: Warm and dry NEUROLOGIC:  Alert and oriented x 3 PSYCHIATRIC:  Normal affect       ASSESSMENT:    1. Ventricular tachycardia (HCC)   2. Dilated cardiomyopathy (HCC)   3. Chronic systolic heart failure (HCC)   4. PVC's (premature ventricular contractions)   5. 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  He is feeling better on the steroids.  Cardiac MRI to  be ordered  -Follow-up cardiac MRI ordered today -He needs to be seen by his [facility] rheumatologist within the next 2 to 3 weeks -After 1 week of prednisone 60 mg by mouth once daily, begin steroid taper as below -We will ask our pharmacist about PCP prophylaxis given his use of Humira -Start calcium, vitamin D and omeprazole as below -Refer to advanced heart failure clinic in [place] cardiac PET sarcoid protocol at [facility]  We did briefly discussed risk of ventricular arrhythmias during today's appointment and ICD therapy in patients with sarcoidosis.  We will need to follow-up the results of the cardiac MRI to better risk stratify him.  We discussed how low EF and significant scar burden within the LV are risk factors for sudden cardiac death and would prompt ICD implant.  We will plan to discuss this further at our 4-week follow-up appointment.  ----------------------------------------- [facility] Immunosuppressive Therapy for Cardiac Sarcoid / Advanced Heart Failure at [facility]   Prednisone  Weeks 1-4: 30 mg daily  Weeks 5-8 : 25 mg daily  Weeks 9-12: 20 mg daily  Weeks 13-16: 15 mg daily Weeks 17-18: 10 mg daily Weeks 19-20: 7.5 mg daily  Weeks 21-22: 5 mg daily Weeks 23-24: 2.5 mg daily  Weeks 25: stop prednisone   Additional Medications  Calcium: 1200-1500 mg daily  Vitamin D: [phone] IU total daily  Omeprazole: 20mg  daily while taking any dose of prednisone.   Monitoring  CBC with diff, AST, ALT, creatinine, and fasting blood glucose  Month 1  Month 2  Month 3  Every 3 months thereafter  Immunizations- Consider the below vaccinations. Document vaccination dates.  Don't start immunosuppression until at least 2 weeks after getting initial vaccinations.  If vaccinations are not completed prior to initiation, consider once prednisone dose is down to 10 mg daily.    Pneumonia Date (PCV15 followed by PPSV23 2 months later OR PCV20):  __delay 2/2 pred dose___  Flu Date (annual flu shot):  _delay 2/2 pred dose____  Varicella Zoster Date (Shingrix followed by 2nd dose of Shingrix 2 months later):  _delay 2/2 pred dose____    Total time spent with patient today 60 minutes. This includes reviewing records, evaluating the patient and coordinating care.  Medication Adjustments/Labs and Tests Ordered: Current medicines are reviewed at length with the patient today.  Concerns regarding medicines are outlined above.  Orders Placed This Encounter  Procedures   AMB referral to CHF clinic   No orders of the defined types were placed in this encounter.    Signed, Hilton Cork. Quentin Ore, MD, [facility], [facility] 09/10/2021 9:43 AM    Electrophysiology [facility]

## 2021-09-10 ENCOUNTER — Encounter: Payer: Self-pay | Admitting: *Deleted

## 2021-09-10 ENCOUNTER — Ambulatory Visit (INDEPENDENT_AMBULATORY_CARE_PROVIDER_SITE_OTHER): Payer: Medicare Other | Admitting: Cardiology

## 2021-09-10 ENCOUNTER — Encounter: Payer: Self-pay | Admitting: Internal Medicine

## 2021-09-10 ENCOUNTER — Encounter: Payer: Self-pay | Admitting: Cardiology

## 2021-09-10 ENCOUNTER — Ambulatory Visit (HOSPITAL_COMMUNITY)
Admission: RE | Admit: 2021-09-10 | Discharge: 2021-09-10 | Disposition: A | Discharge status: Home / Self Care | Payer: Medicare Other | Source: Ambulatory Visit | Attending: Internal Medicine | Admitting: Internal Medicine

## 2021-09-10 VITALS — BP 120/70 | HR 51 | Ht 71.0 in | Wt 243.8 lb

## 2021-09-10 DIAGNOSIS — R079 Chest pain, unspecified: Secondary | ICD-10-CM | POA: Insufficient documentation

## 2021-09-10 DIAGNOSIS — I472 Ventricular tachycardia, unspecified: Secondary | ICD-10-CM | POA: Insufficient documentation

## 2021-09-10 DIAGNOSIS — I420 Dilated cardiomyopathy: Secondary | ICD-10-CM | POA: Insufficient documentation

## 2021-09-10 DIAGNOSIS — D869 Sarcoidosis, unspecified: Secondary | ICD-10-CM | POA: Insufficient documentation

## 2021-09-10 DIAGNOSIS — I5022 Chronic systolic (congestive) heart failure: Secondary | ICD-10-CM

## 2021-09-10 DIAGNOSIS — I493 Ventricular premature depolarization: Secondary | ICD-10-CM | POA: Diagnosis not present

## 2021-09-10 MED ORDER — CALCIUM 1200 1200-1000 MG-UNIT PO CHEW: 1200.0000 mg | CHEWABLE_TABLET | Freq: Every morning | ORAL | 5 refills | Status: AC

## 2021-09-10 MED ORDER — IOHEXOL 350 MG/ML SOLN
100.0000 mL | Freq: Once | INTRAVENOUS | Status: AC | PRN
  Administered 2021-09-10: 100 mL via INTRAVENOUS

## 2021-09-10 MED ORDER — PREDNISONE 5 MG PO TABS: ORAL_TABLET | ORAL | 0 refills | Status: DC

## 2021-09-10 MED ORDER — NITROGLYCERIN 0.4 MG SL SUBL
SUBLINGUAL_TABLET | SUBLINGUAL | Status: AC
  Filled 2021-09-10: qty 2

## 2021-09-10 MED ORDER — OMEPRAZOLE 20 MG PO CPDR: 20.0000 mg | DELAYED_RELEASE_CAPSULE | Freq: Every day | ORAL | 0 refills | Status: AC

## 2021-09-10 MED ORDER — NITROGLYCERIN 0.4 MG SL SUBL
0.8000 mg | SUBLINGUAL_TABLET | Freq: Once | SUBLINGUAL | Status: AC
  Administered 2021-09-10: 0.8 mg via SUBLINGUAL

## 2021-09-10 NOTE — Progress Notes (Signed)
CT scan completed. Tolerated well. D/C home ambulatory, awake and alert. In no distress. 

## 2021-09-10 NOTE — Patient Instructions (Addendum)
Medications: Prednisone  Weeks 1-4: 30 mg daily  Weeks 5-8 : 25 mg daily  Weeks 9-12: 20 mg daily  Weeks 13-16: 15 mg daily Weeks 17-18: 10 mg daily; d/c Bactrim Weeks 19-20: 7.5 mg daily  Weeks 21-22: 5 mg daily Weeks 23-24: 2.5 mg daily  Weeks 25: stop prednisone    Additional Medications  Calcium: 1200-1500 mg daily  Vitamin D: [phone] IU total daily  Omeprazole: 20mg  daily while taking any dose of prednisone.  Your physician recommends that you continue on your current medications as directed. Please refer to the Current Medication list given to you today. *If you need a refill on your cardiac medications before your next appointment, please call your pharmacy*  Lab Work: None. If you have labs (blood work) drawn today and your tests are completely normal, you will receive your results only by: MyChart Message (if you have MyChart) OR A paper copy in the mail If you have any lab test that is abnormal or we need to change your treatment, we will call you to review the results.  Testing/Procedures: Will call about cardiac pet scan that needs to be done at [facility].   Follow-Up: At [facility], you and your health needs are our priority.  As part of our continuing mission to provide you with exceptional heart care, we have created designated Provider Care Teams.  These Care Teams include your primary Cardiologist (physician) and Advanced Practice Providers (APPs -  Physician Assistants and Nurse Practitioners) who all work together to provide you with the care you need, when you need it.  Your physician wants you to follow-up in: 2-3 weeks with your rheumatologist at the [facility]. (Please call them)  4 week follow up with [facility]. [facility]. Okay to double book.   We recommend signing up for the patient portal called "MyChart".  Sign up information is provided on this After Visit Summary.  MyChart is used to connect with patients for Virtual Visits (Telemedicine).  Patients are able to view  lab/test results, encounter notes, upcoming appointments, etc.  Non-urgent messages can be sent to your provider as well.   To learn more about what you can do with MyChart, go to [employer].    Any Other Special Instructions Will Be Listed Below (If Applicable).  Nuclear Medicine Exam A nuclear medicine exam is a safe and painless imaging test. It helps your health care provider detect and diagnose diseases. It also provides information about the ways your organs work and how they are structured. For a nuclear medicine exam, you will be given a radioactive material, called a tracer, that is absorbed by your body's organs. A large scanning machine detects the radioactive tracer and creates pictures of the areas that your health care provider wants to know more about. There are several kinds of nuclear medicine exams. They include the following: CT scan. MRI scan. PET scan. SPECT scan. Tell your health care provider about: Any allergies you have. All medicines you are taking, including vitamins, herbs, eye drops, creams, and over-the-counter medicines. Any problems you or family members have had with anesthetic medicines. Any blood disorders you have. Any surgeries you have had. Any medical conditions you have. Whether you are pregnant, may be pregnant, or are breastfeeding. What are the risks? Generally, this is a safe procedure. However, problems may occur, such as: An allergic reaction to the tracer. This is rare. Exposure to radiation (a small amount). What happens before the procedure? Medicines Ask your health care provider about:  Changing or stopping your regular medicines. This is especially important if you are taking diabetes medicines or blood thinners. Taking medicines such as aspirin and ibuprofen. These medicines can thin your blood. Do not take these medicines unless your health care provider tells you to take them. Taking over-the-counter medicines, vitamins,  herbs, and supplements. General instructions Follow instructions from your health care provider about eating and drinking restrictions. Do not wear jewelry. Wear loose, comfortable clothing. You may be asked to wear a hospital gown for the procedure. Bring previous imaging studies, such as X-rays, with you to the exam if they are available. What happens during the procedure?  An IV may be inserted into one of your veins. You will be asked to lie on a table or sit in a chair. You will be given the radioactive tracer. You may get: A pill or liquid to swallow. An injection. Medicine through your IV. A gas to inhale. A large scanning machine will be used to create images of your body. After the pictures are taken, you may have to wait until your health care provider can make sure that enough images were taken. The procedure may vary among health care providers and hospitals. What can I expect after the procedure? It is up to you to get the results of your procedure. Ask your health care provider, or the department that is doing the procedure, when your results will be ready. Also ask: How will I get my results? What are my treatment options? What other tests do I need? What are my next steps? Follow these instructions at home: Drink enough water to keep your urine pale yellow (6-8 glasses). This helps to remove the radioactive tracer from your body. You may return to your normal activities as told by your health care provider. Get help right away if you: Have problems breathing. This symptom may represent a serious problem that is an emergency. Do not wait to see if the symptoms will go away. Get medical help right away. Call your local emergency services (911 in the [place] Do not drive yourself to the hospital. Summary A nuclear medicine exam is a safe and painless imaging test. It provides information about how your organs are working. It is also used to detect and diagnose  diseases. During the procedure, you will be given a radioactive tracer. A large scanning machine will create images of your body. You may resume your normal activities after the procedure. Follow your health care provider's instructions. Get help right away if you have problems breathing. This information is not intended to replace advice given to you by your health care provider. Make sure you discuss any questions you have with your health care provider. Document Revised: 11/06/2020 Document Reviewed: 07/01/2019 Elsevier Patient Education  2023 [employer].

## 2021-09-18 ENCOUNTER — Encounter: Payer: Self-pay | Admitting: *Deleted

## 2021-09-18 ENCOUNTER — Telehealth: Payer: Self-pay | Admitting: *Deleted

## 2021-09-18 DIAGNOSIS — D869 Sarcoidosis, unspecified: Secondary | ICD-10-CM

## 2021-09-18 NOTE — Telephone Encounter (Addendum)
Please order cardiac MRI for sarcoid evaluation. Needs to be done urgently before appointment with Dan/Dalton. Thanks! Steffanie Dunn   From OV note:  -Order cardiac PET sarcoid protocol at [facility].   Advised patient about MRI needed/ordered, placed order for lab and placed MRI instructions at front desk for patient to pick up.   Faxed request to PET Facility at [facility]. Advised patient I will call him when I have a date and time for scan.

## 2021-09-22 ENCOUNTER — Other Ambulatory Visit: Payer: Medicare Other

## 2021-09-23 MED ORDER — SULFAMETHOXAZOLE-TRIMETHOPRIM 800-160 MG PO TABS
1.0000 | ORAL_TABLET | ORAL | 0 refills | Status: AC
Start: 2021-09-24 — End: 2021-12-17

## 2021-09-23 NOTE — Telephone Encounter (Signed)
PCP prophylaxis is recommended in patients receiving a glucocorticoid dose equivalent to at least 20 mg of prednisone daily for one month or longer who are taking a second immunosuppressive drug. Since he's on Humira and his prednisone dose will be > 20mg  for a month, he would qualify for PCP prophylaxis. Recommend 1 Bactrim DS tablet on Mondays, Wednesdays, and Fridays. Would recommend continuing for 12 weeks, until his prednisone dose is < 20mg /day.   Thanks,  07-[birth year]-1989 (PharmD)   Made patient aware of Bactrim medication ordered as prophylaxis while taking over 20 mg of prednisone. Patient verbalized understanding and agreement.

## 2021-09-25 ENCOUNTER — Other Ambulatory Visit: Payer: Medicare Other

## 2021-09-25 DIAGNOSIS — D869 Sarcoidosis, unspecified: Secondary | ICD-10-CM

## 2021-09-25 LAB — CBC
Hematocrit: 53 % — ABNORMAL HIGH (ref 37.5–51.0)
Hemoglobin: 18.7 g/dL — ABNORMAL HIGH (ref 13.0–17.7)
MCH: 34.3 pg — ABNORMAL HIGH (ref 26.6–33.0)
MCHC: 35.3 g/dL (ref 31.5–35.7)
MCV: 97 fL (ref 79–97)
Platelets: 160 10*3/uL (ref 150–450)
RBC: 5.45 x10E6/uL (ref 4.14–5.80)
RDW: 14.5 % (ref 11.6–15.4)
WBC: 4.7 10*3/uL (ref 3.4–10.8)

## 2021-09-30 IMAGING — RF DG FLUORO GUIDE NDL PLC/BX
3 series · 3 of 3 positions shown · non-contrast
Comparison: Radiograph 09/10/2016

CLINICAL DATA: Bilateral shoulder pain.

EXAM:
Bilateral shoulder INJECTION UNDER FLUOROSCOPY

[Series 1: cp_standard · 0.17mm/px · 1 of 1 slices shown (1 of 3)]
[im 1/1]
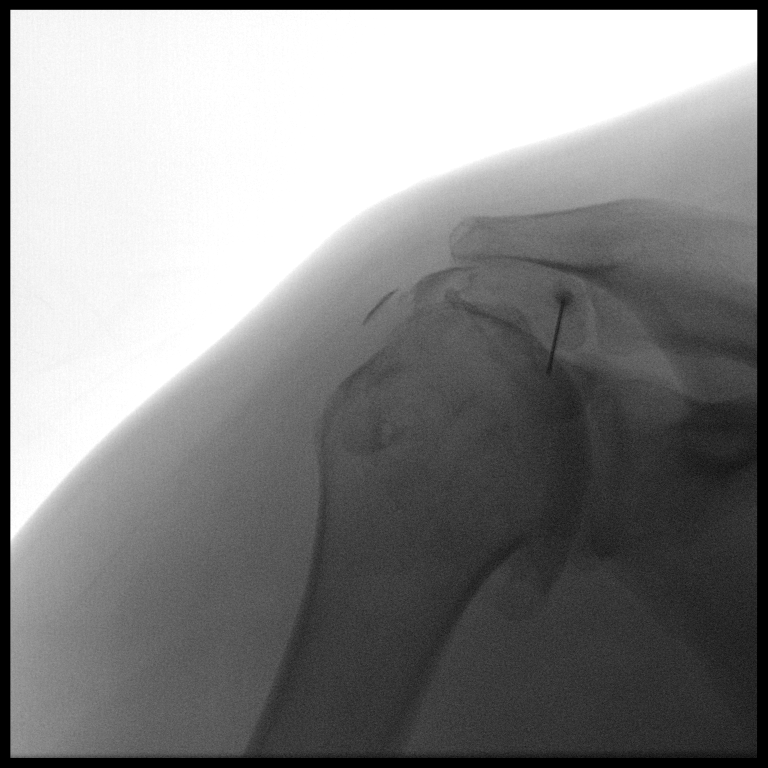

[Series 2: cp_standard · 0.17mm/px · 1 of 1 slices shown (2 of 3)]
[im 1/1]
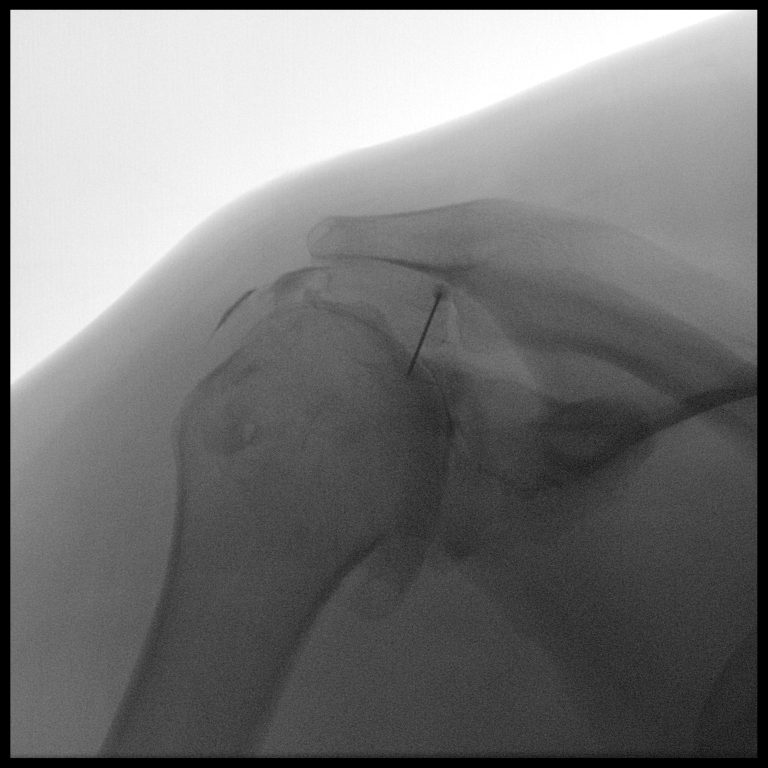

[Series 3: cp_standard · 0.17mm/px · 1 of 1 slices shown (3 of 3)]
[im 1/1]
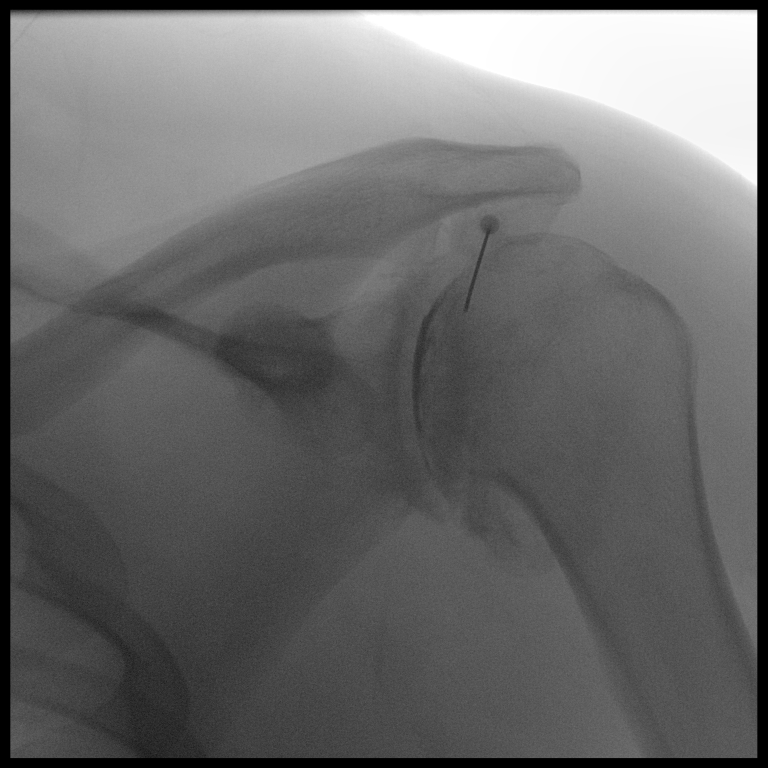

[3 of 3 positions shown; findings below may reference images not displayed]

FLUOROSCOPY TIME:  Fluoroscopy Time:  1 minutes and 6 seconds

Radiation Exposure Index (if provided by the fluoroscopic device):
5.0 mGy

Number of Acquired Spot Images: 0

PROCEDURE:
Written informed consent was obtained. Discussed risks of bleeding,
infection and damage to normal structures.

Right shoulder:

Appropriate site for arthrocentesis was marked on the patient's skin
under fluoroscopy. This area was prepped with Betadine and draped
using sterile technique.

Local anesthesia was achieved with 1% xylocaine.

A 22 gauge spinal needle was then inserted down into the
glenohumeral joint under fluoroscopic guidance. 2 cc of Omnipaque
180 was injected to confirm presence of contrast in the joint. Once
this was established a combination of 5 cc of bupivacaine and 80 mg
of Depo-Medrol was injected into the glenohumeral joint.

Left shoulder:

Appropriate site for arthrocentesis was marked on the patient's skin
under fluoroscopy. This area was prepped with Betadine and draped
using sterile technique.

Local anesthesia was achieved with 1% xylocaine.

A 22 gauge spinal needle was then inserted down into the
glenohumeral joint under fluoroscopic guidance. 2 cc of Omnipaque
180 was injected to confirm presence of contrast in the joint. Once
this was established a combination of 5 cc of bupivacaine and 80 mg
of Depo-Medrol was injected into the glenohumeral joint.

The patient tolerated the procedure well without immediate
complications.

Incidental note was made of some extravasation of contrast into the
subacromial/subdeltoid bursa on the right side suggesting a rotator
cuff tear.
IMPRESSION: 1. Fluoroscopic guided intra-articular injection of steroids into
both glenohumeral joints as above.
2. Contrast extravasation was noted into the right
subacromial/subdeltoid bursa suggesting a rotator cuff tear.

## 2021-10-09 ENCOUNTER — Ambulatory Visit (HOSPITAL_COMMUNITY)
Admission: RE | Admit: 2021-10-09 | Discharge: 2021-10-09 | Disposition: A | Discharge status: Home / Self Care | Payer: Medicare Other | Source: Ambulatory Visit | Attending: Cardiology | Admitting: Cardiology

## 2021-10-09 ENCOUNTER — Encounter (HOSPITAL_COMMUNITY): Payer: Self-pay | Admitting: Cardiology

## 2021-10-09 ENCOUNTER — Other Ambulatory Visit (HOSPITAL_COMMUNITY): Payer: Self-pay | Admitting: Cardiology

## 2021-10-09 ENCOUNTER — Telehealth (HOSPITAL_COMMUNITY): Payer: Self-pay

## 2021-10-09 ENCOUNTER — Ambulatory Visit (HOSPITAL_COMMUNITY)
Admission: RE | Admit: 2021-10-09 | Discharge: 2021-10-09 | Disposition: A | Discharge status: Home / Self Care | Payer: TRICARE For Life (TFL) | Source: Ambulatory Visit | Attending: Cardiology | Admitting: Cardiology

## 2021-10-09 VITALS — BP 102/60 | HR 61 | Wt 245.4 lb

## 2021-10-09 DIAGNOSIS — Z7952 Long term (current) use of systemic steroids: Secondary | ICD-10-CM | POA: Diagnosis not present

## 2021-10-09 DIAGNOSIS — I472 Ventricular tachycardia, unspecified: Secondary | ICD-10-CM | POA: Diagnosis not present

## 2021-10-09 DIAGNOSIS — Z86718 Personal history of other venous thrombosis and embolism: Secondary | ICD-10-CM | POA: Diagnosis not present

## 2021-10-09 DIAGNOSIS — I493 Ventricular premature depolarization: Secondary | ICD-10-CM | POA: Insufficient documentation

## 2021-10-09 DIAGNOSIS — R590 Localized enlarged lymph nodes: Secondary | ICD-10-CM | POA: Insufficient documentation

## 2021-10-09 DIAGNOSIS — I5042 Chronic combined systolic (congestive) and diastolic (congestive) heart failure: Secondary | ICD-10-CM

## 2021-10-09 DIAGNOSIS — I130 Hypertensive heart and chronic kidney disease with heart failure and stage 1 through stage 4 chronic kidney disease, or unspecified chronic kidney disease: Secondary | ICD-10-CM | POA: Insufficient documentation

## 2021-10-09 DIAGNOSIS — I5022 Chronic systolic (congestive) heart failure: Secondary | ICD-10-CM | POA: Insufficient documentation

## 2021-10-09 DIAGNOSIS — Z79899 Other long term (current) drug therapy: Secondary | ICD-10-CM | POA: Insufficient documentation

## 2021-10-09 DIAGNOSIS — Z7901 Long term (current) use of anticoagulants: Secondary | ICD-10-CM | POA: Insufficient documentation

## 2021-10-09 DIAGNOSIS — D869 Sarcoidosis, unspecified: Secondary | ICD-10-CM | POA: Diagnosis not present

## 2021-10-09 DIAGNOSIS — I428 Other cardiomyopathies: Secondary | ICD-10-CM | POA: Insufficient documentation

## 2021-10-09 LAB — BRAIN NATRIURETIC PEPTIDE: B Natriuretic Peptide: 45.7 pg/mL (ref 0.0–100.0)

## 2021-10-09 MED ORDER — METOPROLOL SUCCINATE ER 50 MG PO TB24: 50.0000 mg | ORAL_TABLET | Freq: Every day | ORAL | 3 refills | Status: DC

## 2021-10-09 NOTE — Telephone Encounter (Signed)
Patient is unable to come back due to him going out of town today; will come back on Tuesday when he has pharmacy appointment

## 2021-10-09 NOTE — Progress Notes (Signed)
PCP: [facility] Cardiology: Dr. Ali Lowe EP: Dr. Quentin Ore HF Cardiology: Dr. Aundra Dubin  56 y.o. 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  Sarcoidosis was diagnosed in 1990s by lung biopsy.  He has no family history of sarcoidosis, it was thought to possibly be triggered by exposures during the [place] War (patient served in the army).  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 Back in 10/20, cardiac MRI was concerning for CS => subendocardial diffuse basal LGE not in a coronary distribution.  Echo in 10/21 showed EF 40-45%.  Most recent echo at [facility] was a difficult study, EF reported as 50-55% but likely worse.  Echo in 6/23 at [facility] showed EF 25-30%. Patient had a Zio monitor in 5/23 showing 19% PVCs.  He was seen by Dr. Ali Lowe who was concerned for active cardiac sarcoidosis.  Coronary CTA was done in 7/23 showed no significant CAD. Patient was started on po steroids by Dr. Ali Lowe and was referred to Dr. Quentin Ore for EP evaluation.  Patient has been on adalimumab through his rheumatologist at the [facility] for 2 years. Prior to this, he was on mycophenolate.  He says that he has not taken methotrexate.   Currently, patient is doing well.  He weight has not gone up on prednisone.  He denies exertional dyspnea.  He walks for 2 miles on the treadmill.  He can do some jogging.  No chest pain.  No orthopnea/PND.  No lightheadedness/syncope.   ECG (personally reviewed): NSR, IVCD with QRS 122 msec, PVCs  Labs (6/23): K 4.7, creatinine 1.5 Labs (7/23): hgb 18.7  PMH: 1. HTN 2. CKD stage 3 3. DVT: Spontaneous, 2007 and again in 2008.  4. OSA 5.  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 H/o mitral valve endocarditis: 2018, Strep bacteremia.  7. Erectile dysfunction 8. Sarcoidosis: Ocular, pulmonary, cardiac, and hepatic.  Diagnosed in 1990s via lung biopsy.  9. NSVT/PVCs: Zio monitor (5/23) with 19% PVCs and 1 run NSVT.  10. Chronic systolic CHF: Thought to be due to cardiac sarcoidosis.  - Coronary CTA (7/23): Calcium score 0, normal coronaries.  - Echo (10/21): EF 40-45% - cMRI (10/20): EF 50%, diffuse basal segment subendocardial LGE, apical inferoseptal LGE => not in coronary distribution, concern for cardiac sarcoidosis.  - Echo (6/23, [facility]): EF 50-55%, mild LVH, RV normal (difficult images, EF probably worse than reported).  - Echo (6/23, [facility] hospital): EF 25-30%, akinesis basal anteroseptal wall.   Social History   Socioeconomic History   Marital status: Married    Spouse name: Not on file   Number of children: Not on file   Years of education: Not on file   Highest education level: Not on file  Occupational History   Not on file  Tobacco Use   Smoking status: Former    Packs/day: 0.12    Years: 4.00    Total pack years: 0.48    Types: Cigarettes   Smokeless tobacco: Never   Tobacco comments:    08/21/2017 "stopped in the 1990s"  Vaping Use   Vaping Use: Never used  Substance and Sexual Activity   Alcohol use: Yes    Comment: 08/31/2017 "might have a  margarita once/month; if that"   Drug use: Never   Sexual activity: Yes  Other Topics Concern   Not on file  Social History Narrative   Not on file   Social Determinants of Health   Financial Resource Strain: Not on file  Food Insecurity: Not on file  Transportation Needs: Not on file  Physical Activity: Not on file  Stress: Not on file  Social Connections: Not on file  Intimate Partner Violence: Not on file   Family History  Problem Relation Age of Onset   Breast cancer Mother    Multiple sclerosis Father    Multiple sclerosis Brother    Diabetes Brother     ROS: All systems reviewed and negative except as per HPI.   Current Outpatient Medications  Medication Sig Dispense Refill   acetaminophen (TYLENOL) 500 MG tablet Take 1,000 mg by mouth 4 (four) times daily as needed for mild pain.     Adalimumab 40 MG/0.4ML PNKT INJECT 40MG  (0.4ML) SUBCUTANEOUSLY EVERY OTHER WEEK     albuterol (PROVENTIL HFA;VENTOLIN HFA) 108 (90 Base) MCG/ACT inhaler Inhale 1-2 puffs into the lungs every 6 (six) hours as needed for wheezing or shortness of breath.     Calcium Carbonate-Vit D-Min (CALCIUM 1200) 1200-1000 MG-UNIT CHEW Chew 1,200 mg by mouth every morning. 30 tablet 5   capsaicin (ZOSTRIX) 0.025 % cream Apply 1 application topically 2 (two) times daily as needed (skin care).      carboxymethylcellulose (REFRESH PLUS) 0.5 % SOLN Place 1 drop into both eyes 2 (two) times daily.     Cholecalciferol 50 MCG (2000 UT) TABS TAKE ONE TABLET BY MOUTH DAILY *NOTE CHANGE IN STRENGTH*     cyclobenzaprine (FLEXERIL) 10 MG tablet Take 10 mg by mouth every 8 (eight) hours as needed for muscle spasms.     cycloSPORINE (RESTASIS) 0.05 % ophthalmic emulsion INSTILL 1 DROP IN EACH EYE EVERY 12 HOURS     diclofenac Sodium (VOLTAREN) 1 % GEL APPLY 2 GRAMS TO AFFECTED AREA TWICE A DAY AS NEEDED     diphenhydrAMINE (BENADRYL) 50 MG capsule Take 50 mg by mouth at bedtime.     empagliflozin (JARDIANCE) 25 MG TABS tablet TAKE ONE-HALF TABLET BY MOUTH ONCE DAILY FOR HEART FAILURE     furosemide (LASIX) 20 MG tablet take 1 tablet by mouth once daily 90 tablet 3   gabapentin (NEURONTIN) 400 MG capsule Take 3 capsules by mouth at bedtime.     homatropine 2 % ophthalmic solution Place 2 drops into both eyes 2 (two) times daily.     HYDROcodone-acetaminophen (NORCO/VICODIN) 5-325 MG tablet Take 1-2 tablets by mouth every 4 (four) hours as needed. 8 tablet 0   nepafenac (ILEVRO) 0.3 % ophthalmic suspension INSTILL 1 DROP IN EACH EYE DAILY     omeprazole (PRILOSEC) 20 MG capsule Take 1  capsule (20 mg total) by mouth daily. 175 capsule 0   OVER THE COUNTER MEDICATION Apply 1 application topically 2 (two) times daily as needed (pain). Menthol/M-Salicylate 123XX123 topical cream     prednisoLONE acetate (PRED FORTE) 1 % ophthalmic suspension Place 1 drop into both eyes 2 (two) times daily.     predniSONE (DELTASONE) 5 MG tablet Prednisone: Weeks 1-4: 30 mg daily, Weeks 5-8 : 25 mg daily, Weeks 9-12: 20 mg daily, Weeks 13-16: 15 mg daily, Weeks 17-18: 10 mg daily, Weeks 19-20: 7.5 mg daily, Weeks 21-22: 5 mg daily, Weeks 23-24: 2.5 mg daily, Weeks 25: stop prednisone    150  tablet 0   Rivaroxaban (XARELTO) 15 MG TABS tablet Take 15 mg by mouth daily with supper.     sacubitril-valsartan (ENTRESTO) 97-103 MG TAKE ONE-HALF TABLET BY MOUTH TWICE A DAY FOR HEART FAILURE     sildenafil (VIAGRA) 100 MG tablet Take 50 mg by mouth daily as needed for erectile dysfunction.     spironolactone (ALDACTONE) 25 MG tablet TAKE ONE TABLET BY MOUTH DAILY (FLUID PILL)     sulfamethoxazole-trimethoprim (BACTRIM DS) 800-160 MG tablet Take 1 tablet by mouth 3 (three) times a week. 36 tablet 0   testosterone cypionate (DEPOTESTOSTERONE CYPIONATE) 200 MG/ML injection INJECT 0.3 MLS INTRAMUSCULARLY EVERY 14 DAYS     timolol (TIMOPTIC-XR) 0.5 % ophthalmic gel-forming Place 1 drop into both eyes daily.     traMADol (ULTRAM) 50 MG tablet Take 50 mg by mouth 3 (three) times daily as needed for moderate pain. Maximum dose= 8 tablets per day For pain     urea 10 % lotion APPLY THIN LAYER TO AFFECTED AREA DAILY TO DRY SKIN /CALLUS REGIONS OF BOTH FEET     valACYclovir (VALTREX) 1000 MG tablet Take 1,000 mg by mouth daily.     metoprolol succinate (TOPROL XL) 50 MG 24 hr tablet Take 1 tablet (50 mg total) by mouth at bedtime. 90 tablet 3   No current facility-administered medications for this encounter.   BP 102/60   Pulse 61   Wt 111.3 kg (245 lb 6.4 oz)   SpO2 96%   BMI 34.23 kg/m  General: NAD Neck: No  JVD, no thyromegaly or thyroid nodule.  Lungs: Clear to auscultation bilaterally with normal respiratory effort. CV: Nondisplaced PMI.  Heart regular S1/S2, no S3/S4, no murmur.  No peripheral edema.  No carotid bruit.  Normal pedal pulses.  Abdomen: Soft, nontender, no hepatosplenomegaly, no distention.  Skin: Intact without lesions or rashes.  Neurologic: Alert and oriented x 3.  Psych: Normal affect. Extremities: No clubbing or cyanosis.  HEENT: Normal.   Assessment/Plan: 1. Chronic systolic CHF: Nonischemic cardiomyopathy.  Coronary CTA (7/23) with no significant CAD. Strong suspicion for cardiac sarcoidosis.  Patient has tissue diagnosis of sarcoidosis from lung biopsy. He has h/o frequent PVCs/NSVT as well as decreased LV systolic function and LGE on cMRI. Cardiac MRI in 10/20 with EF 50%, diffuse basal segment subendocardial LGE, apical inferoseptal LGE => not in coronary distribution, concern for cardiac sarcoidosis. Echo [facility] in 6/23 with EF 25-30%.  Echo at [facility] in 6/23 was a technically difficult study. He is not volume overloaded on exam.  NYHA class I symptomatically.  - Continue Entresto 97/103 bid.  - Continue spironolactone 25 mg daily. BMET/BNP today.  - Continue empagliflozin 12.5 mg daily.  - Increase Toprol XL to 50 mg daily. - He is on Lasix 20 mg daily but likely does not need this.  - He will be getting a repeat cardiac MRI here at [facility].  I will try to expedite this.  If EF is < 35%, he will qualify for ICD.  Even if EF is low but > 35%, there still is an indication for ICD in the setting of cardiac sarcoidosis if he has significant LGE present (which I suspect he will).  2. Sarcoidosis: Patient has biopsy-diagnosed sarcoidosis with involvement of lungs, hilar lymphadenopathy, liver, and eyes. Strongly suspect cardiac sarcoidosis with concerning LGE pattern on 2020 MRI and PVCs/NSVT.  Diagnosis was made in the 1990s. He has been on mycophenolate  in the past and  is now on adalimumab (x 2 years).  He was started on prednisone several weeks ago due to frequent PVCs and NSVT on monitoring for more aggressive treatment of cardiac sarcoidosis.  - I agree that it is reasonable to continue him on the [facility] protocol for slow prednisone wean over time.  He will need Bactrim until prednisone dose is < 15 mg/day and will also need omeprazole and vitamin D. I am going to set him up in a pharmacy clinic to run the prednisone taper.  - I will repeat 7 day Zio patch to see if he has improvement in ventricular ectopy with prednisone.  - He is set up for cardiac PET at [facility] to assess cardiac inflammation.  - Would repeat cPET again after prednisone taper is completed.  - Continue rheumatology followup.  - For pulmonary component of sarcoidosis, I think he should have a pulmonologist.  Will make referral.  3. DVT: Spontaneous, x 2.   - Continue Xarelto.  - If disease remains active, ?consider transition from adalimumab to methotrexate (has not been on this in the past).   Followup in 2 months with me, see HF pharmacist in 1-2 weeks to manage prednisone taper.   Marca Ancona. 10/09/2021

## 2021-10-09 NOTE — Telephone Encounter (Signed)
Lab called, cmet was hemolyzed

## 2021-10-09 NOTE — Patient Instructions (Signed)
Increase Toprol XL to 50mg  daily.  Labs done today, your results will be available in MyChart, we will contact you for abnormal readings.  Your provider has recommended that  you wear a Zio Patch for 7 days.  This monitor will record your heart rhythm for our review.  IF you have any symptoms while wearing the monitor please press the button.  If you have any issues with the patch or you notice a red or orange light on it please call the company at [phone].  Once you remove the patch please mail it back to the company as soon as possible so we can get the results.  You have been referred to Pulmonologist. They will call you to arrange the appointment.  Please follow up with our heart failure pharmacist on Tuesday August 8th at 10am.  Your physician recommends that you schedule a follow-up appointment in: 2 months  If you have any questions or concerns before your next appointment please send Richard Shepherd a message through [place] or call our office at [phone].    TO LEAVE A MESSAGE FOR THE NURSE SELECT OPTION 2, PLEASE LEAVE A MESSAGE INCLUDING: YOUR NAME DATE OF BIRTH CALL BACK NUMBER REASON FOR CALL**this is important as we prioritize the call backs  YOU WILL RECEIVE A CALL BACK THE SAME DAY AS LONG AS YOU CALL BEFORE 4:00 PM  At the [facility], you and your health needs are our priority. As part of our continuing mission to provide you with exceptional heart care, we have created designated Provider Care Teams. These Care Teams include your primary Cardiologist (physician) and Advanced Practice Providers (APPs- Physician Assistants and Nurse Practitioners) who all work together to provide you with the care you need, when you need it.   You may see any of the following providers on your designated Care Team at your next follow up: Dr [phone] Dr Arvilla Meres, NP Carron Curie, Robbie Lis [facility] [place], [place],  PharmD   Please be sure to bring in all your medications bottles to every appointment.

## 2021-10-13 NOTE — Patient Instructions (Addendum)
Cardiac Sarcoidosis Treatment Protocol Patient Handout Start Date 7/6  Prednisone  Weeks 1-4 7/6 through 8/2 30 mg (three 10 mg tablets) daily  Weeks 5-8 8/3 through 8/30 25 mg (two and a half 10 mg tablets) daily  Weeks 9-12 8/31 through 9/27 20 mg (two 10 mg tablets) daily  Weeks 13-16 9/28 through 10/25 15 mg (one and a half 10 mg tablets) daily  Weeks 17-18 10/26 through 11/8 10 mg (two 5 mg tablets) daily Stop taking Bactrim on 10/26  Weeks 19-20 11/9 through 11/22 7.5 mg (one and a half 5 mg tablets) daily  Weeks 21-22 11/23 through 12/6 5 mg (one 5 mg tablet) daily  Weeks 23-24 12/7 through 12/20 2.5 mg (half of a 5 mg tablet) daily  Week 25 12/21 Stop prednisone

## 2021-10-14 ENCOUNTER — Inpatient Hospital Stay (HOSPITAL_COMMUNITY)
Admission: RE | Admit: 2021-10-14 | Discharge: 2021-10-14 | Disposition: A | Discharge status: Home / Self Care | Payer: Medicare Other | Source: Ambulatory Visit

## 2021-10-15 ENCOUNTER — Ambulatory Visit: Payer: Medicare Other | Admitting: Cardiology

## 2021-10-27 NOTE — Addendum Note (Signed)
Encounter addended by: Crissie Figures, RN on: 10/27/2021 4:57 PM  Actions taken: Imaging Exam ended

## 2021-11-05 ENCOUNTER — Encounter: Payer: Self-pay | Admitting: Cardiology

## 2021-11-05 ENCOUNTER — Telehealth (HOSPITAL_COMMUNITY): Payer: Self-pay | Admitting: Surgery

## 2021-11-05 MED ORDER — AMIODARONE HCL 200 MG PO TABS: 200.0000 mg | ORAL_TABLET | Freq: Two times a day (BID) | ORAL | 3 refills | Status: DC

## 2021-11-05 NOTE — Telephone Encounter (Signed)
-----   Message from Laurey Morale, MD sent at 11/02/2021 10:20 PM EDT ----- 13.7% PVCs, still frequent.  Would recommend starting amiodarone 200 mg bid x 10 days then 200 mg daily to try to suppress.  Wean over time with treatment of sarcoidosis.

## 2021-11-05 NOTE — Telephone Encounter (Signed)
I called patient to review results and recommendations per Dr. Shirlee Latch.  He is aware and agreeable.  Medlist updated and prescription sent to his pharmacy of choice.

## 2021-11-14 ENCOUNTER — Institutional Professional Consult (permissible substitution): Payer: Medicare Other | Admitting: Pulmonary Disease

## 2021-12-09 ENCOUNTER — Telehealth (HOSPITAL_COMMUNITY): Payer: Self-pay | Admitting: *Deleted

## 2021-12-09 NOTE — Telephone Encounter (Signed)
Reaching out to patient regarding is upcoming cardiac MRI. Patient states he will not be able to make this appointment due to another [facility] medical appointment. Informed him the scheduling team will reach out to reschedule.  Gordy Clement RN Navigator Cardiac Imaging [facility] [phone] Office [phone] Cell

## 2021-12-10 ENCOUNTER — Ambulatory Visit (HOSPITAL_COMMUNITY): Admission: RE | Admit: 2021-12-10 | Payer: Medicare Other | Source: Ambulatory Visit

## 2021-12-17 ENCOUNTER — Ambulatory Visit: Payer: Medicare Other | Admitting: Cardiology

## 2021-12-18 ENCOUNTER — Encounter (HOSPITAL_COMMUNITY): Payer: Medicare Other | Admitting: Cardiology

## 2021-12-19 ENCOUNTER — Institutional Professional Consult (permissible substitution): Payer: Medicare Other | Admitting: Pulmonary Disease

## 2021-12-23 ENCOUNTER — Ambulatory Visit (HOSPITAL_COMMUNITY)
Admission: RE | Admit: 2021-12-23 | Discharge: 2021-12-23 | Disposition: A | Discharge status: Home / Self Care | Payer: Medicare Other | Source: Ambulatory Visit | Attending: Cardiology | Admitting: Cardiology

## 2021-12-23 ENCOUNTER — Encounter (HOSPITAL_COMMUNITY): Payer: Self-pay | Admitting: Cardiology

## 2021-12-23 ENCOUNTER — Other Ambulatory Visit (HOSPITAL_COMMUNITY): Payer: Self-pay | Admitting: Cardiology

## 2021-12-23 ENCOUNTER — Inpatient Hospital Stay (HOSPITAL_COMMUNITY)
Admission: RE | Admit: 2021-12-23 | Discharge: 2021-12-23 | Disposition: A | Discharge status: Home / Self Care | Payer: Medicare Other | Source: Ambulatory Visit | Attending: Cardiology | Admitting: Cardiology

## 2021-12-23 VITALS — BP 100/60 | HR 70 | Wt 251.4 lb

## 2021-12-23 DIAGNOSIS — R590 Localized enlarged lymph nodes: Secondary | ICD-10-CM | POA: Diagnosis not present

## 2021-12-23 DIAGNOSIS — Z79899 Other long term (current) drug therapy: Secondary | ICD-10-CM | POA: Insufficient documentation

## 2021-12-23 DIAGNOSIS — I493 Ventricular premature depolarization: Secondary | ICD-10-CM

## 2021-12-23 DIAGNOSIS — D869 Sarcoidosis, unspecified: Secondary | ICD-10-CM | POA: Diagnosis not present

## 2021-12-23 DIAGNOSIS — I130 Hypertensive heart and chronic kidney disease with heart failure and stage 1 through stage 4 chronic kidney disease, or unspecified chronic kidney disease: Secondary | ICD-10-CM | POA: Diagnosis not present

## 2021-12-23 DIAGNOSIS — Z7984 Long term (current) use of oral hypoglycemic drugs: Secondary | ICD-10-CM | POA: Diagnosis not present

## 2021-12-23 DIAGNOSIS — Z86718 Personal history of other venous thrombosis and embolism: Secondary | ICD-10-CM | POA: Insufficient documentation

## 2021-12-23 DIAGNOSIS — I472 Ventricular tachycardia, unspecified: Secondary | ICD-10-CM | POA: Diagnosis not present

## 2021-12-23 DIAGNOSIS — N183 Chronic kidney disease, stage 3 unspecified: Secondary | ICD-10-CM | POA: Diagnosis not present

## 2021-12-23 DIAGNOSIS — I5042 Chronic combined systolic (congestive) and diastolic (congestive) heart failure: Secondary | ICD-10-CM

## 2021-12-23 DIAGNOSIS — I428 Other cardiomyopathies: Secondary | ICD-10-CM | POA: Diagnosis not present

## 2021-12-23 DIAGNOSIS — Z7901 Long term (current) use of anticoagulants: Secondary | ICD-10-CM | POA: Diagnosis not present

## 2021-12-23 LAB — TSH: TSH: 4.019 u[IU]/mL (ref 0.350–4.500)

## 2021-12-23 NOTE — Patient Instructions (Addendum)
There has been no changes to your medications.  Labs done today, your results will be available in MyChart, we will contact you for abnormal readings.  Your provider has recommended that  you wear a Zio Patch for 3 days.  This monitor will record your heart rhythm for our review.  IF you have any symptoms while wearing the monitor please press the button.  If you have any issues with the patch or you notice a red or orange light on it please call the company at [phone].  Once you remove the patch please mail it back to the company as soon as possible so we can get the results.   PLEASE CALL TO GET YOUR CARDIAC MRI RESCHEDULED.  Your physician recommends that you schedule a follow-up appointment in: 3 months (January 2024) ** please call the office in November to arrange your follow up appointment **  If you have any questions or concerns before your next appointment please send [place] a message through [place] or call our office at [phone].    TO LEAVE A MESSAGE FOR THE NURSE SELECT OPTION 2, PLEASE LEAVE A MESSAGE INCLUDING: YOUR NAME DATE OF BIRTH CALL BACK NUMBER REASON FOR CALL**this is important as we prioritize the call backs  YOU WILL RECEIVE A CALL BACK THE SAME DAY AS LONG AS YOU CALL BEFORE 4:00 PM  At the [facility], you and your health needs are our priority. As part of our continuing mission to provide you with exceptional heart care, we have created designated Provider Care Teams. These Care Teams include your primary Cardiologist (physician) and Advanced Practice Providers (APPs- Physician Assistants and Nurse Practitioners) who all work together to provide you with the care you need, when you need it.   You may see any of the following providers on your designated Care Team at your next follow up: Dr Glori Bickers Dr Loralie Champagne Dr. Roxana Hires, NP Lyda Jester, [place] [facility] [place], [place] Forestine Na,  NP Audry Riles, PharmD   Please be sure to bring in all your medications bottles to every appointment.

## 2021-12-23 NOTE — Progress Notes (Signed)
PCP: [facility] Cardiology: Dr. Ali Lowe EP: Dr. Quentin Ore HF Cardiology: Dr. Aundra Dubin  56 y.o. 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  Sarcoidosis was diagnosed in 1990s by lung biopsy.  He has no family history of sarcoidosis, it was thought to possibly be triggered by exposures during the [place] War (patient served in the army).  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 Back in 10/20, cardiac MRI was concerning for CS => subendocardial diffuse basal LGE not in a coronary distribution.  Echo in 10/21 showed EF 40-45%.  Most recent echo at [facility] was a difficult study, EF reported as 50-55% but likely worse.  Echo in 6/23 at [facility] showed EF 25-30%. Patient had a Zio monitor in 5/23 showing 19% PVCs.  He was seen by Dr. Ali Lowe who was concerned for active cardiac sarcoidosis.  Coronary CTA was done in 7/23 showed no significant CAD. Patient was started on po steroids by Dr. Ali Lowe and was referred to Dr. Quentin Ore for EP evaluation.  Patient has been on adalimumab through his rheumatologist at the [facility] for 2 years. Prior to this, he was on mycophenolate.  He says that he has not taken methotrexate.   Cardiac PET in 8/23 showed EF 43%, no abnormal metabolism to suggest active sarcoidosis.   Patient returns for followup of CHF and cardiac amyloidosis.  No significant exertional dyspnea. He can walk up stairs without problems.  No chest pain.  No lightheadedness/syncope.  No palpitations.  No orthopnea/PND. He is now on prednisone 10 mg daily (gradually tapering down) and is off Bactrim. Weight is up 6 lbs.   ECG (personally reviewed): NSR, 1st  degree AVB, LAFB, LVH  Labs (6/23): K 4.7, creatinine 1.5 Labs (7/23): hgb 18.7  PMH: 1. HTN 2. CKD stage 3 3. DVT: Spontaneous, 2007 and again in 2008.  4. OSA 5. 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 H/o mitral valve endocarditis: 2018, Strep bacteremia.  7. Erectile dysfunction 8. Sarcoidosis: Ocular, pulmonary, cardiac, and hepatic.  Diagnosed in 1990s via lung biopsy.  9. NSVT/PVCs: Zio monitor (5/23) with 19% PVCs and 1 run NSVT.  - Zio monitor (8/23): 13.7% PVCs 10. Chronic systolic CHF: Thought to be due to cardiac sarcoidosis.  - Coronary CTA (7/23): Calcium score 0, normal coronaries.  - Echo (10/21): EF 40-45% - cMRI (10/20): EF 50%, diffuse basal segment subendocardial LGE, apical inferoseptal LGE => not in coronary distribution, concern for cardiac sarcoidosis.  - Echo (6/23, [facility] EF 50-55%, mild LVH, RV normal (difficult images, EF probably worse than reported).  - Echo (6/23, [facility]): EF 25-30%, akinesis basal anteroseptal wall.  - Cardiac PET (8/23): EF 43%, no abnormal metabolism to suggest active sarcoidosis.  Social History   Socioeconomic History   Marital status: Married    Spouse name: Not on file   Number of children: Not on file   Years of education: Not on file   Highest education level: Not on file  Occupational History   Not on file  Tobacco Use   Smoking status: Former    Packs/day: 0.12    Years: 4.00    Total pack years: 0.48    Types: Cigarettes  Smokeless tobacco: Never   Tobacco comments:    08/21/2017 "stopped in the 1990s"  Vaping Use   Vaping Use: Never used  Substance and Sexual Activity   Alcohol use: Yes    Comment: 08/31/2017 "might have a margarita once/month; if that"   Drug use: Never   Sexual activity: Yes  Other Topics Concern   Not on file  Social History Narrative   Not on file   Social Determinants of Health   Financial Resource Strain: Not on file  Food Insecurity: Not on file  Transportation Needs: Not on  file  Physical Activity: Not on file  Stress: Not on file  Social Connections: Not on file  Intimate Partner Violence: Not on file   Family History  Problem Relation Age of Onset   Breast cancer Mother    Multiple sclerosis Father    Multiple sclerosis Brother    Diabetes Brother    ROS: All systems reviewed and negative except as per HPI.   Current Outpatient Medications  Medication Sig Dispense Refill   acetaminophen (TYLENOL) 500 MG tablet Take 1,000 mg by mouth 4 (four) times daily as needed for mild pain.     Adalimumab 40 MG/0.4ML PNKT INJECT 40MG  (0.4ML) SUBCUTANEOUSLY EVERY OTHER WEEK     albuterol (PROVENTIL HFA;VENTOLIN HFA) 108 (90 Base) MCG/ACT inhaler Inhale 1-2 puffs into the lungs every 6 (six) hours as needed for wheezing or shortness of breath.     amiodarone (PACERONE) 200 MG tablet Take 200 mg by mouth daily.     Calcium Carbonate-Vit D-Min (CALCIUM 1200) 1200-1000 MG-UNIT CHEW Chew 1,200 mg by mouth every morning. 30 tablet 5   capsaicin (ZOSTRIX) 0.025 % cream Apply 1 application topically 2 (two) times daily as needed (skin care).      carboxymethylcellulose (REFRESH PLUS) 0.5 % SOLN Place 1 drop into both eyes 2 (two) times daily.     Cholecalciferol 50 MCG (2000 UT) TABS TAKE ONE TABLET BY MOUTH DAILY *NOTE CHANGE IN STRENGTH*     cyclobenzaprine (FLEXERIL) 10 MG tablet Take 10 mg by mouth every 8 (eight) hours as needed for muscle spasms.     cycloSPORINE (RESTASIS) 0.05 % ophthalmic emulsion INSTILL 1 DROP IN EACH EYE EVERY 12 HOURS     diclofenac Sodium (VOLTAREN) 1 % GEL APPLY 2 GRAMS TO AFFECTED AREA TWICE A DAY AS NEEDED     diphenhydrAMINE (BENADRYL) 50 MG capsule Take 50 mg by mouth at bedtime.     empagliflozin (JARDIANCE) 25 MG TABS tablet TAKE ONE-HALF TABLET BY MOUTH ONCE DAILY FOR HEART FAILURE     furosemide (LASIX) 20 MG tablet take 1 tablet by mouth once daily 90 tablet 3   gabapentin (NEURONTIN) 400 MG capsule Take 3 capsules by mouth at  bedtime.     homatropine 2 % ophthalmic solution Place 2 drops into both eyes 2 (two) times daily.     HYDROcodone-acetaminophen (NORCO/VICODIN) 5-325 MG tablet Take 1-2 tablets by mouth every 4 (four) hours as needed. 8 tablet 0   metoprolol succinate (TOPROL XL) 50 MG 24 hr tablet Take 1 tablet (50 mg total) by mouth at bedtime. 90 tablet 3   nepafenac (ILEVRO) 0.3 % ophthalmic suspension INSTILL 1 DROP IN EACH EYE DAILY     omeprazole (PRILOSEC) 20 MG capsule Take 1 capsule (20 mg total) by mouth daily. 175 capsule 0   OVER THE COUNTER MEDICATION Apply 1 application topically 2 (two) times daily as needed (pain). Menthol/M-Salicylate 123XX123 topical cream  prednisoLONE acetate (PRED FORTE) 1 % ophthalmic suspension Place 1 drop into both eyes 2 (two) times daily.     predniSONE (DELTASONE) 5 MG tablet Prednisone: Weeks 1-4: 30 mg daily, Weeks 5-8 : 25 mg daily, Weeks 9-12: 20 mg daily, Weeks 13-16: 15 mg daily, Weeks 17-18: 10 mg daily, Weeks 19-20: 7.5 mg daily, Weeks 21-22: 5 mg daily, Weeks 23-24: 2.5 mg daily, Weeks 25: stop prednisone    150 tablet 0   Rivaroxaban (XARELTO) 15 MG TABS tablet Take 15 mg by mouth daily with supper.     sacubitril-valsartan (ENTRESTO) 97-103 MG TAKE ONE-HALF TABLET BY MOUTH TWICE A DAY FOR HEART FAILURE     sildenafil (VIAGRA) 100 MG tablet Take 50 mg by mouth daily as needed for erectile dysfunction.     spironolactone (ALDACTONE) 25 MG tablet TAKE ONE TABLET BY MOUTH DAILY (FLUID PILL)     testosterone cypionate (DEPOTESTOSTERONE CYPIONATE) 200 MG/ML injection INJECT 0.3 MLS INTRAMUSCULARLY EVERY 14 DAYS     timolol (TIMOPTIC-XR) 0.5 % ophthalmic gel-forming Place 1 drop into both eyes daily.     traMADol (ULTRAM) 50 MG tablet Take 50 mg by mouth 3 (three) times daily as needed for moderate pain. Maximum dose= 8 tablets per day For pain     urea 10 % lotion APPLY THIN LAYER TO AFFECTED AREA DAILY TO DRY SKIN /CALLUS REGIONS OF BOTH FEET     valACYclovir  (VALTREX) 1000 MG tablet Take 1,000 mg by mouth daily.     No current facility-administered medications for this encounter.   BP 100/60   Pulse 70   Wt 114 kg (251 lb 6.4 oz)   SpO2 94%   BMI 35.06 kg/m  General: NAD Neck: No JVD, no thyromegaly or thyroid nodule.  Lungs: Clear to auscultation bilaterally with normal respiratory effort. CV: Nondisplaced PMI.  Heart regular S1/S2, no S3/S4, 2/6 HSM LLSB.  No peripheral edema.  No carotid bruit.  Normal pedal pulses.  Abdomen: Soft, nontender, no hepatosplenomegaly, no distention.  Skin: Intact without lesions or rashes.  Neurologic: Alert and oriented x 3.  Psych: Normal affect. Extremities: No clubbing or cyanosis.  HEENT: Normal.   Assessment/Plan: 1. Chronic systolic CHF: Nonischemic cardiomyopathy.  Coronary CTA (7/23) with no significant CAD. Strong suspicion for cardiac sarcoidosis.  Patient has tissue diagnosis of sarcoidosis from lung biopsy. He has h/o frequent PVCs/NSVT as well as decreased LV systolic function and LGE on cMRI. Cardiac MRI in 10/20 with EF 50%, diffuse basal segment subendocardial LGE, apical inferoseptal LGE => not in coronary distribution, concern for cardiac sarcoidosis. Echo from [facility] in 6/23 with EF 25-30%.  Echo at [facility] in 6/23 was a technically difficult study. Cardiac PET in 8/23 showed EF 43%, no abnormal metabolism to suggest active sarcoidosis.  He is not volume overloaded on exam.  NYHA class I-II symptomatically.  - Continue Entresto 97/103 bid.  - Continue spironolactone 25 mg daily. BMET/BNP today.  - Continue empagliflozin 12.5 mg daily.  - Continue Toprol XL 50 mg daily, no BP room to increase. - He is on Lasix 20 mg daily but likely does not need this.  - He still needs to get a repeat cardiac MRI, he missed his prior appt and needs to be rescheduled.  If EF is < 35%, he will qualify for ICD.  Even if EF is low but > 35%, there still is an indication for ICD in the setting of  cardiac sarcoidosis if he has significant  LGE present (which I suspect he will).  2. Sarcoidosis: Patient has biopsy-diagnosed sarcoidosis with involvement of lungs, hilar lymphadenopathy, liver, and eyes. Strongly suspect cardiac sarcoidosis with concerning LGE pattern on 2020 MRI and PVCs/NSVT.  Diagnosis was made in the 1990s. He has been on mycophenolate in the past and is now on adalimumab (x 2 years).  He was started on prednisone due to frequent PVCs and NSVT on monitoring for more aggressive treatment of cardiac sarcoidosis.  He is now titrating down prednisone.  Cardiac PET at [facility] in 8/23 showed EF 43%, no abnormal metabolism to suggest active sarcoidosis. - Continue to wean prednisone gradually per protocol.  He is now off Bactrim.  - Repeat cardiac PET next year once he is off prednisone to make sure inflammation remains quiescent.  - Continue rheumatology followup for Humira.  - For pulmonary component of sarcoidosis, I think he should have a pulmonologist.  He says that he has an appt at the [place].   3. DVT: Spontaneous, x 2.   - Continue Xarelto.  4. 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  He has been started on amiodarone.  - Continue amiodarone 200 mg daily.   Check LFTs and TSH, he will need regular eye exam. - Repeat Zio monitor x 3 days to make sure PVCs have improved on amiodarone.   Followup in 3 months.   Loralie Champagne. 12/23/2021

## 2022-01-05 ENCOUNTER — Telehealth (HOSPITAL_COMMUNITY): Payer: Self-pay

## 2022-01-05 NOTE — Telephone Encounter (Signed)
Informed patient of results

## 2022-01-19 ENCOUNTER — Telehealth: Payer: Self-pay | Admitting: *Deleted

## 2022-01-19 NOTE — Telephone Encounter (Signed)
The patient said he didn't realized that he had canceled it and the results are preferred before he sees Dr. Lalla Brothers. Verbalized understanding and agreement. Will forward for assistance with rescheduling MRI and moving out appointment with Dr. Lalla Brothers until after.

## 2022-01-19 NOTE — Telephone Encounter (Signed)
-----   Message from Lanier Prude, MD sent at 01/17/2022  4:22 PM EST ----- Ideally he really should have the MRI to help risk stratify him before his appointment with me. Can you reach out to him to see if he is willing to reschedule the MRI. If he is, I'd like him to get the [facility] and reschedule our follow up appointment. Thanks, Steffanie Dunn       ----- Message ----- From: Sampson Goon, RN Sent: 01/15/2022  10:56 AM EST To: Lanier Prude, MD  Hey,  This patients appt was pushed out in Oct for MRI scheduled on 10/4. It looks like he canceled the MRI and has not rescheduled. Only had HM. Reschedule OV on 11/15?  Thank you, Royetta Crochet

## 2022-01-21 ENCOUNTER — Ambulatory Visit: Payer: Medicare Other | Admitting: Cardiology

## 2022-01-28 ENCOUNTER — Other Ambulatory Visit: Payer: Self-pay | Admitting: Cardiology

## 2022-02-03 ENCOUNTER — Telehealth (HOSPITAL_COMMUNITY): Payer: Self-pay | Admitting: *Deleted

## 2022-02-03 NOTE — Telephone Encounter (Signed)
Office note from 10/09/21 faxed to [facility] hospital at [phone] as requested by [facility].

## 2022-04-22 ENCOUNTER — Telehealth (HOSPITAL_COMMUNITY): Payer: Self-pay | Admitting: *Deleted

## 2022-04-22 NOTE — Telephone Encounter (Signed)
Reaching out to patient to offer assistance regarding upcoming cardiac imaging study; pt verbalizes understanding of appt date/time, parking situation and where to check in, and verified current allergies; name and call back number provided for further questions should they arise  Harshal Sirmon RN Navigator Cardiac Imaging [facility] [phone] office [phone] cell  Patient denies claustrophobia or metal. 

## 2022-04-23 ENCOUNTER — Ambulatory Visit (HOSPITAL_COMMUNITY)
Admission: RE | Admit: 2022-04-23 | Discharge: 2022-04-23 | Disposition: A | Discharge status: Home / Self Care | Payer: Medicare Other | Source: Ambulatory Visit | Attending: Cardiology | Admitting: Cardiology

## 2022-04-23 ENCOUNTER — Other Ambulatory Visit: Payer: Self-pay | Admitting: Cardiology

## 2022-04-23 DIAGNOSIS — D869 Sarcoidosis, unspecified: Secondary | ICD-10-CM | POA: Diagnosis present

## 2022-04-23 MED ORDER — GADOBUTROL 1 MMOL/ML IV SOLN
10.0000 mL | Freq: Once | INTRAVENOUS | Status: AC | PRN
  Administered 2022-04-23: 10 mL via INTRAVENOUS

## 2022-04-26 NOTE — Progress Notes (Deleted)
Electrophysiology Office Follow up Visit Note:    Date:  04/26/2022   ID:  Richard Shepherd, DOB [birth year], MRN [id number]  PCP:  [facility]  [facility] Cardiologist:  Early Osmond, MD  [facility] Electrophysiologist:  Vickie Epley, MD    Interval History:    Richard Shepherd is a 57 y.o. male who presents for a follow up visit. They were last seen in clinic 09/10/2021 for VT. He has a history of sarcoidosis in his lungs and is followed by the [facility] rheumatology team. He has very frequent PVC's and NSVT and has been undergoing testing for cardiac sarcoidosis.  He just had a cardiac MRI.  He saw Dr Aundra Dubin in the HF clnic 12/23/2021. His monitor was repeated to monitor PVC burden while on amiodarone. PVC burden was 1.5%.     Past Medical History:  Diagnosis Date   Arthritis    "all over" (08/31/2017)   Asthma    Chest pain    Chronic lower back pain    CKD (chronic kidney disease), stage III ([place])    Archie Endo 08/31/2017   DVT (deep venous thrombosis) ([place]) < & 06/2006   a. mainly affect R leg   Endocarditis 2018   Fibromyalgia    Heart murmur    OSA on CPAP    Pericarditis 2017   Positive cardiac stress test    Sarcoidosis    a. eye involvement only    Past Surgical History:  Procedure Laterality Date   CATARACT EXTRACTION W/ INTRAOCULAR LENS  IMPLANT, BILATERAL Bilateral 2012   ENDOVENOUS ABLATION SAPHENOUS VEIN W/ LASER Right 05/29/2020   endovenous laser ablation right greater saphenous vein and stab phlebectomy > 20 incisions by Ruta Hinds MD    EYE SURGERY Bilateral 2015   scraped calcium from cornea   GLAUCOMA SURGERY Bilateral 2012    Current Medications: No outpatient medications have been marked as taking for the 04/29/22 encounter (Appointment) with Vickie Epley, MD.     Allergies:   Patient has no known allergies.   Social History   Socioeconomic History   Marital status: Married    Spouse name: Not on file   Number of  children: Not on file   Years of education: Not on file   Highest education level: Not on file  Occupational History   Not on file  Tobacco Use   Smoking status: Former    Packs/day: 0.12    Years: 4.00    Total pack years: 0.48    Types: Cigarettes   Smokeless tobacco: Never   Tobacco comments:    08/21/2017 "stopped in the 1990s"  Vaping Use   Vaping Use: Never used  Substance and Sexual Activity   Alcohol use: Yes    Comment: 08/31/2017 "might have a margarita once/month; if that"   Drug use: Never   Sexual activity: Yes  Other Topics Concern   Not on file  Social History Narrative   Not on file   Social Determinants of Health   Financial Resource Strain: Not on file  Food Insecurity: Not on file  Transportation Needs: Not on file  Physical Activity: Not on file  Stress: Not on file  Social Connections: Not on file     Family History: The patient's family history includes Breast cancer in his mother; Diabetes in his brother; Multiple sclerosis in his brother and father.  ROS:   Please see the history of present illness.    All other systems reviewed  and are negative.  EKGs/Labs/Other Studies Reviewed:    The following studies were reviewed today:  04/23/2022 cMR Severely dilated LV EF 50 Elevated T2 signal in the apical lateral segment LGE, patchy Hilar adenopathy ?previous mitral endocarditis     Recent Labs: 09/04/2021: ALT 27; BUN 16; Creatinine, Ser 1.52; Potassium 4.7; Sodium 138 09/25/2021: Hemoglobin 18.7; Platelets 160 10/09/2021: B Natriuretic Peptide 45.7 12/23/2021: TSH 4.019  Recent Lipid Panel No results found for: "CHOL", "TRIG", "HDL", "CHOLHDL", "VLDL", "LDLCALC", "LDLDIRECT"  Physical Exam:    VS:  There were no vitals taken for this visit.    Wt Readings from Last 3 Encounters:  12/23/21 251 lb 6.4 oz (114 kg)  10/09/21 245 lb 6.4 oz (111.3 kg)  09/10/21 243 lb 12.8 oz (110.6 kg)     GEN: *** Well nourished, well developed in  no acute distress CARDIAC: ***RRR, no murmurs, rubs, gallops RESPIRATORY:  Clear to auscultation without rales, wheezing or rhonchi        ASSESSMENT:    1. Ventricular tachycardia ([place])   2. PVC (premature ventricular contraction)   3. Sarcoidosis   4. Chronic combined systolic and diastolic heart failure (HCC)    PLAN:    In order of problems listed above:  #Cardiac Sarcoidosis #PVC/NSVT PVC burden significantly improved, 1.5% on recent monitor Continue amiodarone 274m PO daily       Medication Adjustments/Labs and Tests Ordered: Current medicines are reviewed at length with the patient today.  Concerns regarding medicines are outlined above.  No orders of the defined types were placed in this encounter.  No orders of the defined types were placed in this encounter.    Signed, CLars Mage MD, F[facility] F[facility]2/18/2024 7:00 PM    Electrophysiology [facility]

## 2022-04-29 ENCOUNTER — Ambulatory Visit: Payer: Medicare Other | Attending: Cardiology | Admitting: Cardiology

## 2022-04-29 DIAGNOSIS — I472 Ventricular tachycardia, unspecified: Secondary | ICD-10-CM

## 2022-04-29 DIAGNOSIS — I5042 Chronic combined systolic (congestive) and diastolic (congestive) heart failure: Secondary | ICD-10-CM

## 2022-04-29 DIAGNOSIS — D869 Sarcoidosis, unspecified: Secondary | ICD-10-CM

## 2022-04-29 DIAGNOSIS — I493 Ventricular premature depolarization: Secondary | ICD-10-CM

## 2022-04-30 ENCOUNTER — Encounter: Payer: Self-pay | Admitting: Cardiology

## 2022-05-06 ENCOUNTER — Institutional Professional Consult (permissible substitution): Payer: No Typology Code available for payment source | Admitting: Pulmonary Disease

## 2022-05-06 ENCOUNTER — Ambulatory Visit: Payer: No Typology Code available for payment source | Admitting: Cardiology

## 2022-05-06 NOTE — Progress Notes (Deleted)
  Electrophysiology Office Follow up Visit Note:    Date:  05/06/2022   ID:  Vaibhav Morganelli, DOB 1965/05/31, MRN [id number]  PCP:  [facility]  [facility] Cardiologist:  Early Osmond, MD  [facility] Electrophysiologist:  Vickie Epley, MD    Interval History:    Jayvyn Balingit is a 57 y.o. male who presents for a follow up visit.   Last seen September 10, 2021 for cardiac sarcoid.  He was started on immunosuppression.  He had a repeat cardiac MRI April 24, 2022.  That showed an ejection fraction 50%.  There is scant LGE on the MRI.     Past medical, surgical, social and family history were reviewed.  ROS:   Please see the history of present illness.    All other systems reviewed and are negative.  EKGs/Labs/Other Studies Reviewed:    The following studies were reviewed today:  OSH PET from [facility] reviewed, no active inflammation  04/2022 cMR reviewed   EKG:  The ekg ordered today demonstrates ***   Physical Exam:    VS:  There were no vitals taken for this visit.    Wt Readings from Last 3 Encounters:  12/23/21 251 lb 6.4 oz (114 kg)  10/09/21 245 lb 6.4 oz (111.3 kg)  09/10/21 243 lb 12.8 oz (110.6 kg)     GEN: *** Well nourished, well developed in no acute distress CARDIAC: ***RRR, no murmurs, rubs, gallops RESPIRATORY:  Clear to auscultation without rales, wheezing or rhonchi       ASSESSMENT:    1. Chronic combined systolic and diastolic heart failure (HCC)   2. Sarcoidosis   3. PVCs (premature ventricular contractions)   4. Encounter for long-term (current) use of high-risk medication    PLAN:    In order of problems listed above:  # Chronic systolic heart failure #Nonischemic cardiomyopathy EF is now recovered to about 50%.  LGE minimal on cardiac MRI. No evidence of significant conduction disease on EKG. At this point, I do not think an ICD is indicated.  #High risk med monitoring-amiodarone Needs repeat CMP, TSH and  free T4 today      Signed, Lars Mage, MD, [facility], [facility] 05/06/2022 6:08 AM    Electrophysiology [facility]

## 2022-05-12 NOTE — Progress Notes (Unsigned)
  Electrophysiology Office Follow up Visit Note:    Date:  05/12/2022   ID:  Richard Shepherd, DOB [birth year], MRN [id number]  PCP:  [facility]  [facility] Cardiologist:  Early Osmond, MD  [facility] Electrophysiologist:  Vickie Epley, MD    Interval History:    Richard Shepherd is a 57 y.o. male who presents for a follow up visit.   I last saw him 09/10/2021[facility] Hx of systemic sarcoidosis. High & of PVC/NSVT and OSH imaging concerning for cardiac sarcoidosis. Started on immunosuppression and now with significantly improved EF. No significant LGE on repeat cardiac MRI.  Today he is with his wife in clinic. He still feels tired at times. He knows he is still deconditioned.    Past medical, surgical, social and family history were reviewed.  ROS:   Please see the history of present illness.    All other systems reviewed and are negative.  EKGs/Labs/Other Studies Reviewed:    The following studies were reviewed today:  04/24/2022 cMR Minimal LGE  Today's ecg shows sinus rhythm with frequent monomorphic pVCs.  Physical Exam:    VS:  There were no vitals taken for this visit.    Wt Readings from Last 3 Encounters:  12/23/21 251 lb 6.4 oz (114 kg)  10/09/21 245 lb 6.4 oz (111.3 kg)  09/10/21 243 lb 12.8 oz (110.6 kg)     GEN:  Well nourished, well developed in no acute distress CARDIAC: RRR, no murmurs, rubs, gallops RESPIRATORY:  Clear to auscultation without rales, wheezing or rhonchi       ASSESSMENT:    1. PVCs (premature ventricular contractions)   2. Chronic combined systolic and diastolic heart failure ([place])   3. Ventricular tachycardia ([place])   4. Sarcoidosis    PLAN:    In order of problems listed above:   # Chronic systolic heart failure #Nonischemic cardiomyopathy EF is now recovered to about 50%.  LGE minimal on cardiac MRI. No evidence of significant conduction disease on EKG. At this point, I do not think an  ICD is indicated.  #PVC's/NSVT Much improved although still hhaving some PVC's. Continue amiodarone at current dose.  #High risk med monitoring-amiodarone Needs repeat CMP, TSH and free T4 today   Follow up 6 months with APP.      Signed, Lars Mage, MD, [facility], [facility] 05/12/2022 7:39 PM    Electrophysiology [facility]

## 2022-05-13 ENCOUNTER — Encounter: Payer: Self-pay | Admitting: Cardiology

## 2022-05-13 ENCOUNTER — Other Ambulatory Visit
Admission: RE | Admit: 2022-05-13 | Discharge: 2022-05-13 | Disposition: A | Discharge status: Home / Self Care | Payer: No Typology Code available for payment source | Source: Ambulatory Visit | Attending: Cardiology | Admitting: Cardiology

## 2022-05-13 ENCOUNTER — Ambulatory Visit: Payer: No Typology Code available for payment source | Attending: Cardiology | Admitting: Cardiology

## 2022-05-13 VITALS — BP 102/58 | HR 63 | Ht 71.0 in | Wt 250.0 lb

## 2022-05-13 DIAGNOSIS — I5042 Chronic combined systolic (congestive) and diastolic (congestive) heart failure: Secondary | ICD-10-CM | POA: Diagnosis present

## 2022-05-13 DIAGNOSIS — I493 Ventricular premature depolarization: Secondary | ICD-10-CM

## 2022-05-13 DIAGNOSIS — I472 Ventricular tachycardia, unspecified: Secondary | ICD-10-CM

## 2022-05-13 DIAGNOSIS — Z79899 Other long term (current) drug therapy: Secondary | ICD-10-CM

## 2022-05-13 DIAGNOSIS — D869 Sarcoidosis, unspecified: Secondary | ICD-10-CM

## 2022-05-13 LAB — T4, FREE: Free T4: 0.93 ng/dL (ref 0.61–1.12)

## 2022-05-13 LAB — CBC
HCT: 45.4 % (ref 39.0–52.0)
Hemoglobin: 15.8 g/dL (ref 13.0–17.0)
MCH: 33.5 pg (ref 26.0–34.0)
MCHC: 34.8 g/dL (ref 30.0–36.0)
MCV: 96.4 fL (ref 80.0–100.0)
Platelets: 226 10*3/uL (ref 150–400)
RBC: 4.71 MIL/uL (ref 4.22–5.81)
RDW: 12.9 % (ref 11.5–15.5)
WBC: 4.6 10*3/uL (ref 4.0–10.5)
nRBC: 0 % (ref 0.0–0.2)

## 2022-05-13 LAB — TSH: TSH: 6.883 u[IU]/mL — ABNORMAL HIGH (ref 0.350–4.500)

## 2022-05-13 NOTE — Patient Instructions (Signed)
Medication Instructions:  Your physician recommends that you continue on your current medications as directed. Please refer to the Current Medication list given to you today.  *If you need a refill on your cardiac medications before your next appointment, please call your pharmacy*   Lab Work: CMET, TSH, T4 You will get your lab work at [facility]) hospital.  Your lab work will be done at the [place] next to the [employer]. These are walk in labs- you will not need an appointment and you do not need to be fasting.     Follow-Up: At [facility], you and your health needs are our priority.  As part of our continuing mission to provide you with exceptional heart care, we have created designated Provider Care Teams.  These Care Teams include your primary Cardiologist (physician) and Advanced Practice Providers (APPs -  Physician Assistants and Nurse Practitioners) who all work together to provide you with the care you need, when you need it.  Your next appointment:   6 month(s)  Provider:   You will see one of the following Advanced Practice Providers on your designated Care Team:   Tommye Standard, [place]" Gene Autry, [place], NP

## 2022-05-25 ENCOUNTER — Encounter: Payer: Self-pay | Admitting: Pulmonary Disease

## 2022-05-25 ENCOUNTER — Ambulatory Visit (INDEPENDENT_AMBULATORY_CARE_PROVIDER_SITE_OTHER): Payer: No Typology Code available for payment source | Admitting: Pulmonary Disease

## 2022-05-25 VITALS — BP 118/72 | HR 61 | Ht 71.0 in | Wt 248.8 lb

## 2022-05-25 DIAGNOSIS — D869 Sarcoidosis, unspecified: Secondary | ICD-10-CM | POA: Diagnosis not present

## 2022-05-25 NOTE — Progress Notes (Signed)
Synopsis: Referred in March 2024 for Sarcoidosis by Bea Laura, MD  Subjective:   PATIENT ID: Richard Shepherd GENDER: male DOB: Nov 15, 1965, MRN: [id number]  HPI  Chief Complaint  Patient presents with   Consult    Referred by [facility] for history of sarcoidosis. Denies any recent breathing changes.    Richard Shepherd is a 57 year old male, former smoker with history of sarcoidosis, rheumatoid arthritis, DVT, CKD III, OSA on CPAP and chronic systolic heart failure who is referred to pulmonary clinic for sarcoidosis.   He was diagnosed with Sarcoidosis in the 90s via lung biopsies. He had chest pains, vision changes, shortness of breath and weight loss at the time. He was treated with prednisone for a year. 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 He was recently treated for sarcoidosis of the heart with extended steroid taper by Dr. Aundra Dubin. Most recent echo and cardiac MRI show normal EF. He is on amiodarone for PVCs.   He has history of rheumatoid arthritis and is on adalimumab.   He denies cough, wheezing or shortness of breath at this time. He was recently sick with the flu and he has had a prolonged recovery but is feeling much better.   PFTs from 2021 at the [facility] show FEV1/FVC 83, FVC 71.6% (3.05L), FEV1 75.5% (2.54L), TLC 60.7%, DLCO 83%.   He is retired from [employer]. He worked for [employer] after [employer]. He lives with his wife. He has 3 children. No family history of sarcoidosis. His father has history of MS and died of an MI. His brother has MS. Mother had breast cancer and multiple family members on her side have RA.   Past Medical History:  Diagnosis Date   Arthritis    "all over" (08/31/2017)   Asthma    Chest pain    Chronic lower back pain    CKD (chronic kidney disease), stage III ([place])    Ar[facility] Endo 08/31/2017   DVT (deep venous thrombosis) (HCC) < & 06/2006   a. mainly affect R leg   Endocarditis 2018   Fibromyalgia    Heart murmur    OSA on CPAP     Pericarditis 2017   Positive cardiac stress test    Sarcoidosis    a. eye involvement only     Family History  Problem Relation Age of Onset   Breast cancer Mother    Multiple sclerosis Father    Multiple sclerosis Brother    Diabetes Brother      Social History   Socioeconomic History   Marital status: Married    Spouse name: Not on file   Number of children: Not on file   Years of education: Not on file   Highest education level: Not on file  Occupational History   Not on file  Tobacco Use   Smoking status: Former    Packs/day: 0.12    Years: 4.00    Additional pack years: 0.00    Total pack years: 0.48    Types: Cigarettes   Smokeless tobacco: Never   Tobacco comments:    08/21/2017 "stopped in the 1990s"  Vaping Use   Vaping Use: Never used  Substance and Sexual Activity   Alcohol use: Yes    Comment: 08/31/2017 "might have a margarita once/month; if that"   Drug use: Never   Sexual activity: Yes  Other Topics Concern   Not on file  Social History Narrative   Not on file   Social  Determinants of Health   Financial Resource Strain: Not on file  Food Insecurity: Not on file  Transportation Needs: Not on file  Physical Activity: Not on file  Stress: Not on file  Social Connections: Not on file  Intimate Partner Violence: Not on file     No Known Allergies   Outpatient Medications Prior to Visit  Medication Sig Dispense Refill   acetaminophen (TYLENOL) 500 MG tablet Take 1,000 mg by mouth 4 (four) times daily as needed for mild pain.     Adalimumab 40 MG/0.4ML PNKT INJECT 40MG  (0.4ML) SUBCUTANEOUSLY EVERY OTHER WEEK     albuterol (PROVENTIL HFA;VENTOLIN HFA) 108 (90 Base) MCG/ACT inhaler Inhale 1-2 puffs into the lungs every 6 (six) hours as needed for wheezing or shortness of breath.     amiodarone (PACERONE) 200 MG tablet Take 200 mg by mouth daily.     capsaicin (ZOSTRIX) 0.025 % cream Apply 1 application topically 2 (two) times daily as needed (skin  care).      carboxymethylcellulose (REFRESH PLUS) 0.5 % SOLN Place 1 drop into both eyes 2 (two) times daily.     Cholecalciferol 50 MCG (2000 UT) TABS TAKE ONE TABLET BY MOUTH DAILY *NOTE CHANGE IN STRENGTH*     cyclobenzaprine (FLEXERIL) 10 MG tablet Take 10 mg by mouth every 8 (eight) hours as needed for muscle spasms.     cycloSPORINE (RESTASIS) 0.05 % ophthalmic emulsion INSTILL 1 DROP IN EACH EYE EVERY 12 HOURS     diclofenac Sodium (VOLTAREN) 1 % GEL APPLY 2 GRAMS TO AFFECTED AREA TWICE A DAY AS NEEDED     diphenhydrAMINE (BENADRYL) 50 MG capsule Take 50 mg by mouth at bedtime.     empagliflozin (JARDIANCE) 25 MG TABS tablet TAKE ONE-HALF TABLET BY MOUTH ONCE DAILY FOR HEART FAILURE     furosemide (LASIX) 20 MG tablet take 1 tablet by mouth once daily 90 tablet 3   gabapentin (NEURONTIN) 400 MG capsule Take 3 capsules by mouth at bedtime.     homatropine 2 % ophthalmic solution Place 2 drops into both eyes 2 (two) times daily.     HYDROcodone-acetaminophen (NORCO/VICODIN) 5-325 MG tablet Take 1-2 tablets by mouth every 4 (four) hours as needed. 8 tablet 0   metoprolol succinate (TOPROL XL) 50 MG 24 hr tablet Take 1 tablet (50 mg total) by mouth at bedtime. 90 tablet 3   nepafenac (ILEVRO) 0.3 % ophthalmic suspension INSTILL 1 DROP IN EACH EYE DAILY     OVER THE COUNTER MEDICATION Apply 1 application topically 2 (two) times daily as needed (pain). Menthol/M-Salicylate 123XX123 topical cream     prednisoLONE acetate (PRED FORTE) 1 % ophthalmic suspension Place 1 drop into both eyes 2 (two) times daily.     predniSONE (DELTASONE) 5 MG tablet WEEKS 1-4: TAKE 6 TABLETS BY MOUTH DAILY, WEEKS 5-8: 5TABLETS DAILY, WEEKS 9-12: TAKE 4 TABLETS DAILY, WEEKS 13-16: TAKE 3 TABLETS DAILY, WEEKS 17-18: TAKE 2 TABLETS DAILY, WEEKS 19-20: TAKE 1 AND 1/2 TABLETS DAILY, WEEKS 21-22: TAKE 1 TABLET DAILY, WEEKS 23-24: TAKE 1/2 TABLET DAILY THEN STOP 574 tablet 0   Rivaroxaban (XARELTO) 15 MG TABS tablet Take 15 mg  by mouth daily with supper.     sacubitril-valsartan (ENTRESTO) 97-103 MG TAKE ONE-HALF TABLET BY MOUTH TWICE A DAY FOR HEART FAILURE     sildenafil (VIAGRA) 100 MG tablet Take 50 mg by mouth daily as needed for erectile dysfunction.     spironolactone (ALDACTONE) 25 MG tablet TAKE ONE  TABLET BY MOUTH DAILY (FLUID PILL)     testosterone cypionate (DEPOTESTOSTERONE CYPIONATE) 200 MG/ML injection INJECT 0.3 MLS INTRAMUSCULARLY EVERY 14 DAYS     timolol (TIMOPTIC-XR) 0.5 % ophthalmic gel-forming Place 1 drop into both eyes daily.     traMADol (ULTRAM) 50 MG tablet Take 50 mg by mouth 3 (three) times daily as needed for moderate pain. Maximum dose= 8 tablets per day For pain     urea 10 % lotion APPLY THIN LAYER TO AFFECTED AREA DAILY TO DRY SKIN /CALLUS REGIONS OF BOTH FEET     valACYclovir (VALTREX) 1000 MG tablet Take 1,000 mg by mouth daily.     Calcium Carbonate-Vit D-Min (CALCIUM 1200) 1200-1000 MG-UNIT CHEW Chew 1,200 mg by mouth every morning. 30 tablet 5   omeprazole (PRILOSEC) 20 MG capsule Take 1 capsule (20 mg total) by mouth daily. 175 capsule 0   No facility-administered medications prior to visit.   Review of Systems  Constitutional:  Negative for chills, fever, malaise/fatigue and weight loss.  HENT:  Negative for congestion, sinus pain and sore throat.   Eyes: Negative.   Respiratory:  Negative for cough, hemoptysis, sputum production, shortness of breath and wheezing.   Cardiovascular:  Negative for chest pain, palpitations, orthopnea, claudication and leg swelling.  Gastrointestinal:  Negative for abdominal pain, heartburn, nausea and vomiting.  Genitourinary: Negative.   Musculoskeletal:  Negative for joint pain and myalgias.  Skin:  Negative for rash.  Neurological:  Negative for weakness.  Endo/Heme/Allergies: Negative.   Psychiatric/Behavioral: Negative.     Objective:   Vitals:   05/25/22 1016  BP: 118/72  Pulse: 61  SpO2: 97%  Weight: 248 lb 12.8 oz (112.9 kg)   Height: 5\' 11"  (1.803 m)   Physical Exam Constitutional:      General: He is not in acute distress. HENT:     Head: Normocephalic and atraumatic.  Eyes:     Extraocular Movements: Extraocular movements intact.     Conjunctiva/sclera: Conjunctivae normal.     Pupils: Pupils are equal, round, and reactive to light.  Cardiovascular:     Rate and Rhythm: Normal rate and regular rhythm.     Pulses: Normal pulses.     Heart sounds: Normal heart sounds. No murmur heard. Pulmonary:     Effort: Pulmonary effort is normal.     Breath sounds: Normal breath sounds.  Abdominal:     General: Bowel sounds are normal.     Palpations: Abdomen is soft.  Musculoskeletal:     Right lower leg: No edema.     Left lower leg: No edema.  Lymphadenopathy:     Cervical: No cervical adenopathy.  Skin:    General: Skin is warm and dry.  Neurological:     General: No focal deficit present.     Mental Status: He is alert.  Psychiatric:        Mood and Affect: Mood normal.        Behavior: Behavior normal.        Thought Content: Thought content normal.        Judgment: Judgment normal.    CBC    Component Value Date/Time   WBC 4.6 05/13/2022 0904   RBC 4.71 05/13/2022 0904   HGB 15.8 05/13/2022 0904   HGB 18.7 (H) 09/25/2021 1402   HCT 45.4 05/13/2022 0904   HCT 53.0 (H) 09/25/2021 1402   PLT 226 05/13/2022 0904   PLT 160 09/25/2021 1402   MCV 96.4 05/13/2022 0904  MCV 97 09/25/2021 1402   MCH 33.5 05/13/2022 0904   MCHC 34.8 05/13/2022 0904   RDW 12.9 05/13/2022 0904   RDW 14.5 09/25/2021 1402   LYMPHSABS 1.2 12/10/2020 1135   MONOABS 0.4 12/10/2020 1135   EOSABS 0.2 12/10/2020 1135   BASOSABS 0.0 12/10/2020 1135      Latest Ref Rng & Units 09/04/2021    4:39 PM 12/10/2020   11:35 AM 09/01/2017   12:21 AM  BMP  Glucose 70 - 99 mg/dL 87  130  81   BUN 6 - 24 mg/dL 16  15  19    Creatinine 0.76 - 1.27 mg/dL 1.52  1.60  1.65   BUN/Creat Ratio 9 - 20 11     Sodium 134 - 144 mmol/L  138  134  142   Potassium 3.5 - 5.2 mmol/L 4.7  4.4  4.0   Chloride 96 - 106 mmol/L 100  103  107   CO2 20 - 29 mmol/L 22  22  25    Calcium 8.7 - 10.2 mg/dL 10.0  9.1  8.8    Chest imaging: CT Coronary Scan 09/10/21 Extensive calcified thoracic adenopathy with mild Perifissural nodularity, most consistent with the EMR history of sarcoidosis.   Pulmonary artery enlargement suggests pulmonary arterial hypertension.   Aortic Atherosclerosis  PFT:     No data to display         Labs:  Path:  Echo 09/05/21: LV 50-55% RV size is moderately dilated. RV size and function is normal.   Heart Catheterization:  Cardiac MRI 04/24/22 LVEF 50% with severe LV dilation.   Compared to outside reporting (prior study) Basal LGE persists with resolution of prior apical LGE. Clinically this may represent improvement of cardiac sarcoidosis activity on immunosuppressive therapy.   Mitral valve is suggestive of prior endocarditis. Free breathing study limited accurate MR quantification.  Assessment & Plan:   Sarcoidosis - Plan: Pulmonary Function Test, CT CHEST HIGH RESOLUTION  Discussion: Tyten Uptegrove is a 57 year old male, former smoker with history of sarcoidosis, rheumatoid arthritis, DVT, CKD III, OSA on CPAP and chronic systolic heart failure who is referred to pulmonary clinic for sarcoidosis.   We will check a high resolution CT chest scan to evaluate his lungs for the extent of sarcoidosis involvement. We will check pulmonary function tests at follow up visit.   He appears clinically stable at this time from a pulmonary standpoint. His cardiac function has improved with the extended steroid taper. No plans at this time for further immunosuppression treatment.  Follow up in 3 months with PFTs.  Freda Jackson, MD [facility] Office: [phone]   Current Outpatient Medications:    acetaminophen (TYLENOL) 500 MG tablet, Take 1,000 mg by mouth 4  (four) times daily as needed for mild pain., Disp: , Rfl:    Adalimumab 40 MG/0.4ML PNKT, INJECT 40MG  (0.4ML) SUBCUTANEOUSLY EVERY OTHER WEEK, Disp: , Rfl:    albuterol (PROVENTIL HFA;VENTOLIN HFA) 108 (90 Base) MCG/ACT inhaler, Inhale 1-2 puffs into the lungs every 6 (six) hours as needed for wheezing or shortness of breath., Disp: , Rfl:    amiodarone (PACERONE) 200 MG tablet, Take 200 mg by mouth daily., Disp: , Rfl:    capsaicin (ZOSTRIX) 0.025 % cream, Apply 1 application topically 2 (two) times daily as needed (skin care). , Disp: , Rfl:    carboxymethylcellulose (REFRESH PLUS) 0.5 % SOLN, Place 1 drop into both eyes 2 (two) times daily., Disp: , Rfl:  Cholecalciferol 50 MCG (2000 UT) TABS, TAKE ONE TABLET BY MOUTH DAILY *NOTE CHANGE IN STRENGTH*, Disp: , Rfl:    cyclobenzaprine (FLEXERIL) 10 MG tablet, Take 10 mg by mouth every 8 (eight) hours as needed for muscle spasms., Disp: , Rfl:    cycloSPORINE (RESTASIS) 0.05 % ophthalmic emulsion, INSTILL 1 DROP IN EACH EYE EVERY 12 HOURS, Disp: , Rfl:    diclofenac Sodium (VOLTAREN) 1 % GEL, APPLY 2 GRAMS TO AFFECTED AREA TWICE A DAY AS NEEDED, Disp: , Rfl:    diphenhydrAMINE (BENADRYL) 50 MG capsule, Take 50 mg by mouth at bedtime., Disp: , Rfl:    empagliflozin (JARDIANCE) 25 MG TABS tablet, TAKE ONE-HALF TABLET BY MOUTH ONCE DAILY FOR HEART FAILURE, Disp: , Rfl:    furosemide (LASIX) 20 MG tablet, take 1 tablet by mouth once daily, Disp: 90 tablet, Rfl: 3   gabapentin (NEURONTIN) 400 MG capsule, Take 3 capsules by mouth at bedtime., Disp: , Rfl:    homatropine 2 % ophthalmic solution, Place 2 drops into both eyes 2 (two) times daily., Disp: , Rfl:    HYDROcodone-acetaminophen (NORCO/VICODIN) 5-325 MG tablet, Take 1-2 tablets by mouth every 4 (four) hours as needed., Disp: 8 tablet, Rfl: 0   metoprolol succinate (TOPROL XL) 50 MG 24 hr tablet, Take 1 tablet (50 mg total) by mouth at bedtime., Disp: 90 tablet, Rfl: 3   nepafenac (ILEVRO) 0.3 %  ophthalmic suspension, INSTILL 1 DROP IN EACH EYE DAILY, Disp: , Rfl:    OVER THE COUNTER MEDICATION, Apply 1 application topically 2 (two) times daily as needed (pain). Menthol/M-Salicylate 22-29% topical cream, Disp: , Rfl:    prednisoLONE acetate (PRED FORTE) 1 % ophthalmic suspension, Place 1 drop into both eyes 2 (two) times daily., Disp: , Rfl:    predniSONE (DELTASONE) 5 MG tablet, WEEKS 1-4: TAKE 6 TABLETS BY MOUTH DAILY, WEEKS 5-8: 5TABLETS DAILY, WEEKS 9-12: TAKE 4 TABLETS DAILY, WEEKS 13-16: TAKE 3 TABLETS DAILY, WEEKS 17-18: TAKE 2 TABLETS DAILY, WEEKS 19-20: TAKE 1 AND 1/2 TABLETS DAILY, WEEKS 21-22: TAKE 1 TABLET DAILY, WEEKS 23-24: TAKE 1/2 TABLET DAILY THEN STOP, Disp: 574 tablet, Rfl: 0   Rivaroxaban (XARELTO) 15 MG TABS tablet, Take 15 mg by mouth daily with supper., Disp: , Rfl:    sacubitril-valsartan (ENTRESTO) 97-103 MG, TAKE ONE-HALF TABLET BY MOUTH TWICE A DAY FOR HEART FAILURE, Disp: , Rfl:    sildenafil (VIAGRA) 100 MG tablet, Take 50 mg by mouth daily as needed for erectile dysfunction., Disp: , Rfl:    spironolactone (ALDACTONE) 25 MG tablet, TAKE ONE TABLET BY MOUTH DAILY (FLUID PILL), Disp: , Rfl:    testosterone cypionate (DEPOTESTOSTERONE CYPIONATE) 200 MG/ML injection, INJECT 0.3 MLS INTRAMUSCULARLY EVERY 14 DAYS, Disp: , Rfl:    timolol (TIMOPTIC-XR) 0.5 % ophthalmic gel-forming, Place 1 drop into both eyes daily., Disp: , Rfl:    traMADol (ULTRAM) 50 MG tablet, Take 50 mg by mouth 3 (three) times daily as needed for moderate pain. Maximum dose= 8 tablets per day For pain, Disp: , Rfl:    urea 10 % lotion, APPLY THIN LAYER TO AFFECTED AREA DAILY TO DRY SKIN /CALLUS REGIONS OF BOTH FEET, Disp: , Rfl:    valACYclovir (VALTREX) 1000 MG tablet, Take 1,000 mg by mouth daily., Disp: , Rfl:    Calcium Carbonate-Vit D-Min (CALCIUM 1200) 1200-1000 MG-UNIT CHEW, Chew 1,200 mg by mouth every morning., Disp: 30 tablet, Rfl: 5   omeprazole (PRILOSEC) 20 MG capsule, Take 1 capsule  (20 mg  total) by mouth daily., Disp: 175 capsule, Rfl: 0

## 2022-05-25 NOTE — Patient Instructions (Addendum)
We will check a high resolution CT chest scan to monitor your lungs  We will schedule you for pulmonary function tests   Follow up in 3 months with pulmonary function tests

## 2022-06-09 ENCOUNTER — Ambulatory Visit (HOSPITAL_COMMUNITY)
Admission: RE | Admit: 2022-06-09 | Discharge: 2022-06-09 | Disposition: A | Discharge status: Home / Self Care | Payer: Medicare Other | Source: Ambulatory Visit | Attending: Pulmonary Disease | Admitting: Pulmonary Disease

## 2022-06-09 DIAGNOSIS — D869 Sarcoidosis, unspecified: Secondary | ICD-10-CM | POA: Diagnosis present

## 2022-06-27 IMAGING — DX DG CHEST 2V
2 series · 2 of 2 positions shown · non-contrast
Comparison: 09/10/2016.

CLINICAL DATA: Short of breath for a week.  History of sarcoidosis.

EXAM:
CHEST - 2 VIEW

[w chest pa]
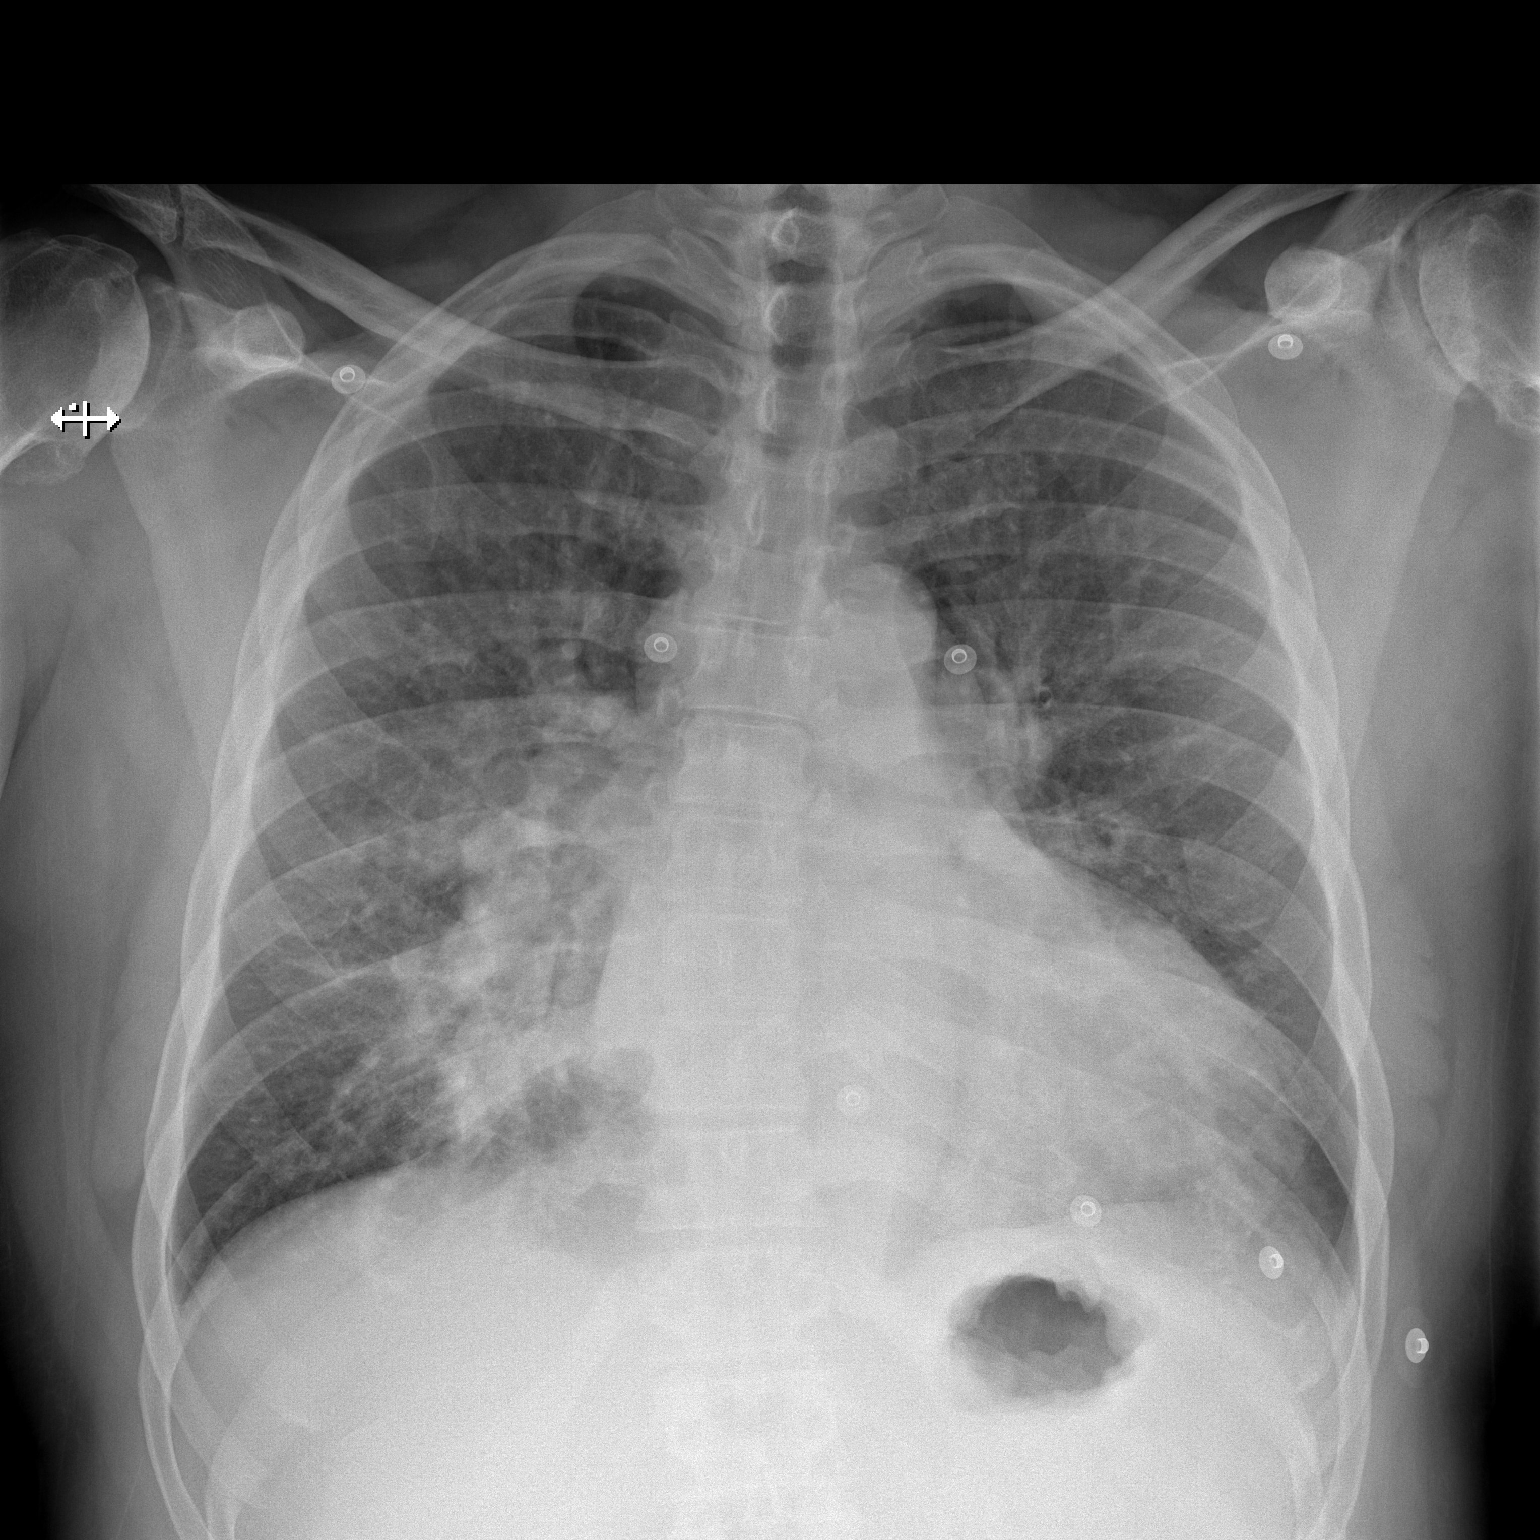

[w chest lat]
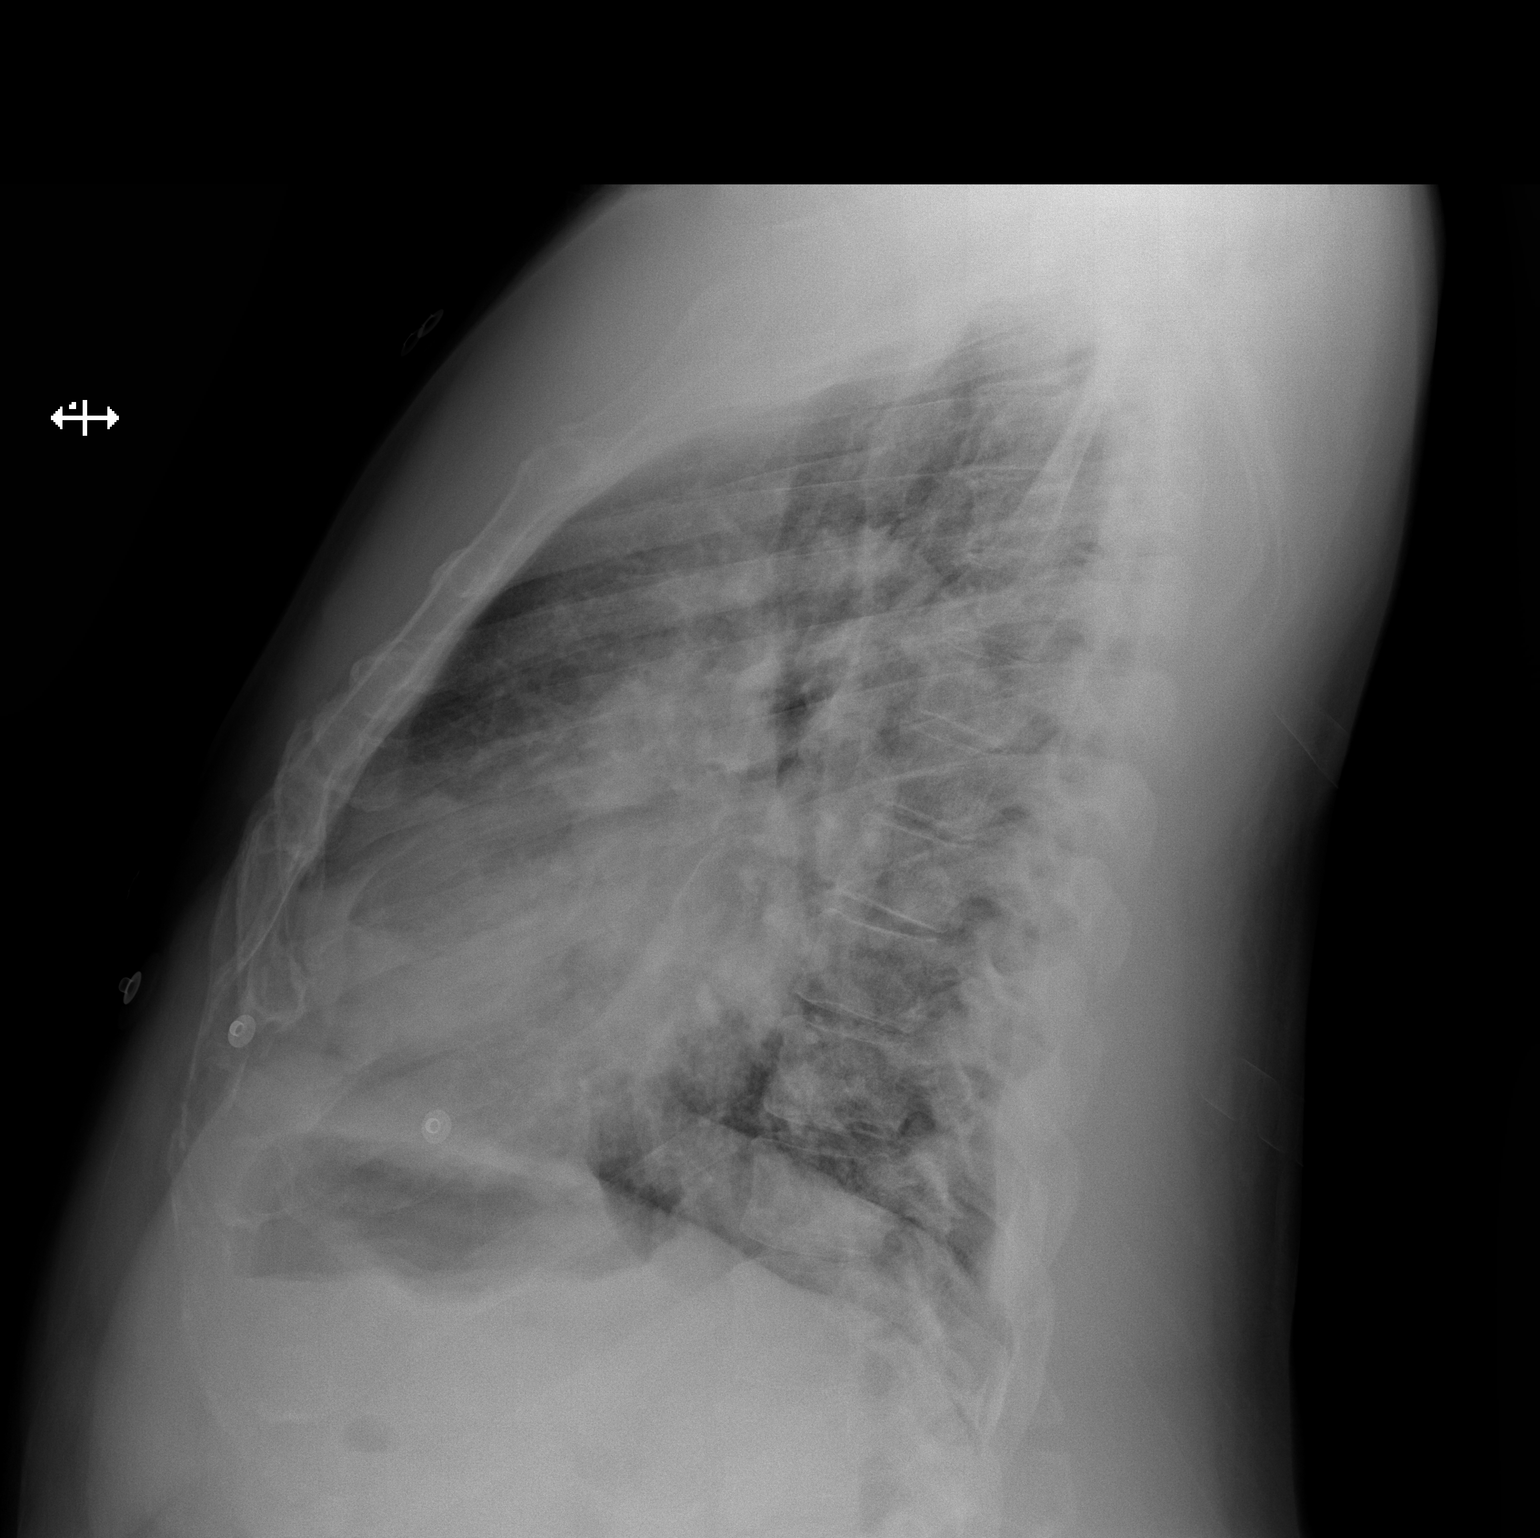

[2 of 2 positions shown; findings below may reference images not displayed]

FINDINGS: Cardiac silhouette is normal in size. No convincing mediastinal
masses. Bilateral hilar prominence similar to the prior exam. There
is airspace opacity extending inferior from the right hilum to the
right medial lung base, increased when compared to the prior study.
Mild interstitial thickening is also noted bilaterally most evident
the right lateral lung base.

No pleural effusion or pneumothorax.

Skeletal structures intact.
IMPRESSION: 1. Airspace opacity in the medial right lower lobe is suspicious for
pneumonia in the proper clinical setting.
2. Hilar prominence similar to the prior chest radiograph suggesting
adenopathy from sarcoidosis.

## 2022-10-12 ENCOUNTER — Other Ambulatory Visit (HOSPITAL_COMMUNITY): Payer: Self-pay

## 2022-10-12 MED ORDER — AMIODARONE HCL 200 MG PO TABS: 200.0000 mg | ORAL_TABLET | Freq: Every day | ORAL | 3 refills | Status: DC

## 2022-11-03 ENCOUNTER — Telehealth (HOSPITAL_COMMUNITY): Payer: Self-pay

## 2022-11-03 NOTE — Telephone Encounter (Signed)
Called to confirm/remind patient of their appointment [facility] on 11[facility]04[facility]22.   Patient reminded to bring all medications and/or complete list.  Confirmed patient has transportation. Gave directions, instructed to utilize valet parking.  Confirmed appointment prior to ending call.

## 2022-11-04 ENCOUNTER — Ambulatory Visit (HOSPITAL_COMMUNITY)
Admission: RE | Admit: 2022-11-04 | Discharge: 2022-11-04 | Disposition: A | Discharge status: Home / Self Care | Payer: Medicare Other | Source: Ambulatory Visit | Attending: Family Medicine | Admitting: Family Medicine

## 2022-11-04 ENCOUNTER — Encounter (HOSPITAL_COMMUNITY): Payer: Self-pay

## 2022-11-04 VITALS — BP 108/76 | HR 70 | Wt 245.2 lb

## 2022-11-04 DIAGNOSIS — Z86718 Personal history of other venous thrombosis and embolism: Secondary | ICD-10-CM | POA: Diagnosis not present

## 2022-11-04 DIAGNOSIS — I472 Ventricular tachycardia, unspecified: Secondary | ICD-10-CM | POA: Insufficient documentation

## 2022-11-04 DIAGNOSIS — N183 Chronic kidney disease, stage 3 unspecified: Secondary | ICD-10-CM | POA: Insufficient documentation

## 2022-11-04 DIAGNOSIS — I444 Left anterior fascicular block: Secondary | ICD-10-CM | POA: Insufficient documentation

## 2022-11-04 DIAGNOSIS — I493 Ventricular premature depolarization: Secondary | ICD-10-CM | POA: Diagnosis not present

## 2022-11-04 DIAGNOSIS — R590 Localized enlarged lymph nodes: Secondary | ICD-10-CM | POA: Diagnosis not present

## 2022-11-04 DIAGNOSIS — I130 Hypertensive heart and chronic kidney disease with heart failure and stage 1 through stage 4 chronic kidney disease, or unspecified chronic kidney disease: Secondary | ICD-10-CM | POA: Diagnosis present

## 2022-11-04 DIAGNOSIS — I5022 Chronic systolic (congestive) heart failure: Secondary | ICD-10-CM

## 2022-11-04 DIAGNOSIS — Z79899 Other long term (current) drug therapy: Secondary | ICD-10-CM | POA: Diagnosis not present

## 2022-11-04 DIAGNOSIS — Z7901 Long term (current) use of anticoagulants: Secondary | ICD-10-CM | POA: Diagnosis not present

## 2022-11-04 DIAGNOSIS — D869 Sarcoidosis, unspecified: Secondary | ICD-10-CM

## 2022-11-04 DIAGNOSIS — I428 Other cardiomyopathies: Secondary | ICD-10-CM | POA: Insufficient documentation

## 2022-11-04 LAB — IRON AND TIBC
Iron: 120 ug/dL (ref 45–182)
Saturation Ratios: 41 % — ABNORMAL HIGH (ref 17.9–39.5)
TIBC: 294 ug/dL (ref 250–450)
UIBC: 174 ug/dL

## 2022-11-04 LAB — CBC
HCT: 48.2 % (ref 39.0–52.0)
Hemoglobin: 17 g/dL (ref 13.0–17.0)
MCH: 34.1 pg — ABNORMAL HIGH (ref 26.0–34.0)
MCHC: 35.3 g/dL (ref 30.0–36.0)
MCV: 96.6 fL (ref 80.0–100.0)
Platelets: 250 10*3/uL (ref 150–400)
RBC: 4.99 MIL/uL (ref 4.22–5.81)
RDW: 12.9 % (ref 11.5–15.5)
WBC: 4.8 10*3/uL (ref 4.0–10.5)
nRBC: 0 % (ref 0.0–0.2)

## 2022-11-04 LAB — BASIC METABOLIC PANEL
Anion gap: 8 (ref 5–15)
BUN: 26 mg/dL — ABNORMAL HIGH (ref 6–20)
CO2: 23 mmol/L (ref 22–32)
Calcium: 9.3 mg/dL (ref 8.9–10.3)
Chloride: 106 mmol/L (ref 98–111)
Creatinine, Ser: 1.55 mg/dL — ABNORMAL HIGH (ref 0.61–1.24)
GFR, Estimated: 52 mL/min — ABNORMAL LOW (ref >60)
Glucose, Bld: 97 mg/dL (ref 70–99)
Potassium: 4.7 mmol/L (ref 3.5–5.1)
Sodium: 137 mmol/L (ref 135–145)

## 2022-11-04 LAB — FERRITIN: Ferritin: 281 ng/mL (ref 24–336)

## 2022-11-04 LAB — BRAIN NATRIURETIC PEPTIDE: B Natriuretic Peptide: 43.8 pg/mL (ref 0.0–100.0)

## 2022-11-04 MED ORDER — FUROSEMIDE 20 MG PO TABS: 20.0000 mg | ORAL_TABLET | Freq: Every day | ORAL | Status: DC | PRN

## 2022-11-04 NOTE — Progress Notes (Signed)
PCP: [facility] Cardiology: Dr. Lynnette Caffey EP: Dr. Lalla Brothers HF Cardiology: Dr. Shirlee Latch  57 y.o. 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  Sarcoidosis was diagnosed in 1990s by lung biopsy.  He has no family history of sarcoidosis, it was thought to possibly be triggered by exposures during the [place] War (patient served in the army).  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 Back in 10/20, cardiac MRI was concerning for CS => subendocardial diffuse basal LGE not in a coronary distribution.  Echo in 10/21 showed EF 40-45%.  Most recent echo at [facility] was a difficult study, EF reported as 50-55% but likely worse.  Echo in 6/23 at [facility] showed EF 25-30%. Patient had a Zio monitor in 5/23 showing 19% PVCs.  He was seen by Dr. Lynnette Caffey who was concerned for active cardiac sarcoidosis.  Coronary CTA was done in 7/23 showed no significant CAD. Patient was started on po steroids by Dr. Lynnette Caffey and was referred to Dr. Lalla Brothers for EP evaluation.  Patient has been on adalimumab through his rheumatologist at the [facility] for 2 years. Prior to this, he was on mycophenolate.  He says that he has not taken methotrexate.   Cardiac PET in 8/23 showed EF 43%, no abnormal metabolism to suggest active sarcoidosis.   Zio 2 day (10/23) Mostly NSR, 1.5 % PVC burden.  cMRI (2/24) showed LVEF 50% with severe dilation, RVEF  51%, mitral valve with evidence of prior endocarditis with mild MR, basal LGE (resolution of prior apical LGE).  Today he returns for cardiac sarcoidosis follow up. Overall feeling fine, main complaint is generalized fatigue. He has CPAP but does not wear  it. He has SOB with exercise, but limited mostly by ortho issues. Ankles swell occasionally. Denies palpitations, CP, dizziness, or PND/Orthopnea. Appetite ok. No fever or chills. Weight at home 240 pounds. Taking all medications. Had an episode in July 2024, after being on airplane, felt "like someone was pushing on me." No dizziness, nausea, or dyspnea during event. No episodes since. Occasional ETOH, no tobacco, no drug use. No longer on prednisone.  ECG (personally reviewed): NSR, 1st degree AVB, LVH 70 bpm  Labs (6/23): K 4.7, creatinine 1.5 Labs (7/23): hgb 18.7 Labs (3/24): hgb 15.8, TSH mildly elevated, normal T4  PMH: 1. HTN 2. CKD stage 3 3. DVT: Spontaneous, 2007 and again in 2008.  4. OSA 5. 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 H/o mitral valve endocarditis: 2018, Strep bacteremia.  7. Erectile dysfunction 8. Sarcoidosis: Ocular, pulmonary, cardiac, and hepatic.  Diagnosed in 1990s via lung biopsy.  9. NSVT/PVCs: Zio monitor (5/23) with 19% PVCs and 1 run NSVT.  - Zio monitor (8/23): 13.7% PVCs 10. Chronic systolic CHF: Thought to be due to cardiac sarcoidosis.  - Coronary CTA (7/23): Calcium score 0, normal coronaries.  - Echo (10/21): EF 40-45% - cMRI (10/20): EF 50%, diffuse basal segment subendocardial LGE, apical inferoseptal LGE => not in coronary distribution, concern for cardiac sarcoidosis.  - Echo (6/23, [facility]): EF 50-55%, mild LVH, RV normal (difficult images, EF probably worse than reported).  - Echo (6/23, [facility]): EF 25-30%, akinesis basal anteroseptal wall.  - Cardiac PET (8/23):  EF 43%, no abnormal metabolism to suggest active sarcoidosis.  Social History   Socioeconomic History   Marital status: Married    Spouse name: Not on file   Number of children: Not on file   Years of education: Not on file   Highest education level: Not on file  Occupational History   Not on file  Tobacco Use   Smoking status: Former    Current packs/day: 0.12    Average  packs/day: 0.1 packs/day for 4.0 years (0.5 ttl pk-yrs)    Types: Cigarettes   Smokeless tobacco: Never   Tobacco comments:    08/21/2017 "stopped in the 1990s"  Vaping Use   Vaping status: Never Used  Substance and Sexual Activity   Alcohol use: Yes    Comment: 08/31/2017 "might have a margarita once/month; if that"   Drug use: Never   Sexual activity: Yes  Other Topics Concern   Not on file  Social History Narrative   Not on file   Social Determinants of Health   Financial Resource Strain: Not on file  Food Insecurity: Not on file  Transportation Needs: Not on file  Physical Activity: Not on file  Stress: Not on file  Social Connections: Not on file  Intimate Partner Violence: Not on file   Family History  Problem Relation Age of Onset   Breast cancer Mother    Multiple sclerosis Father    Multiple sclerosis Brother    Diabetes Brother    ROS: All systems reviewed and negative except as per HPI.   Current Outpatient Medications  Medication Sig Dispense Refill   acetaminophen (TYLENOL) 500 MG tablet Take 1,000 mg by mouth 4 (four) times daily as needed for mild pain.     Adalimumab 40 MG/0.4ML PNKT INJECT 40MG  (0.4ML) SUBCUTANEOUSLY EVERY OTHER WEEK     albuterol (PROVENTIL HFA;VENTOLIN HFA) 108 (90 Base) MCG/ACT inhaler Inhale 1-2 puffs into the lungs every 6 (six) hours as needed for wheezing or shortness of breath.     amiodarone (PACERONE) 200 MG tablet Take 1 tablet (200 mg total) by mouth daily. 90 tablet 3   Calcium Carbonate-Vit D-Min (CALCIUM 1200) 1200-1000 MG-UNIT CHEW Chew 1,200 mg by mouth every morning. 30 tablet 5   capsaicin (ZOSTRIX) 0.025 % cream Apply 1 application topically 2 (two) times daily as needed (skin care).      carboxymethylcellulose (REFRESH PLUS) 0.5 % SOLN Place 1 drop into both eyes 2 (two) times daily.     Cholecalciferol 50 MCG (2000 UT) TABS TAKE ONE TABLET BY MOUTH DAILY *NOTE CHANGE IN STRENGTH*     cyclobenzaprine (FLEXERIL) 10 MG  tablet Take 10 mg by mouth every 8 (eight) hours as needed for muscle spasms.     cycloSPORINE (RESTASIS) 0.05 % ophthalmic emulsion INSTILL 1 DROP IN EACH EYE EVERY 12 HOURS     diclofenac Sodium (VOLTAREN) 1 % GEL APPLY 2 GRAMS TO AFFECTED AREA TWICE A DAY AS NEEDED     diphenhydrAMINE (BENADRYL) 50 MG capsule Take 50 mg by mouth at bedtime.     empagliflozin (JARDIANCE) 25 MG TABS tablet TAKE ONE-HALF TABLET BY MOUTH ONCE DAILY FOR HEART FAILURE     furosemide (LASIX) 20 MG tablet take 1 tablet by mouth once daily 90 tablet 3   gabapentin (NEURONTIN) 400 MG capsule Take 3 capsules by mouth at bedtime.     homatropine 2 % ophthalmic solution Place 2 drops into both eyes 2 (two) times daily.     metoprolol  succinate (TOPROL XL) 50 MG 24 hr tablet Take 1 tablet (50 mg total) by mouth at bedtime. 90 tablet 3   nepafenac (ILEVRO) 0.3 % ophthalmic suspension INSTILL 1 DROP IN EACH EYE DAILY     omeprazole (PRILOSEC) 20 MG capsule Take 1 capsule (20 mg total) by mouth daily. 175 capsule 0   OVER THE COUNTER MEDICATION Apply 1 application topically 2 (two) times daily as needed (pain). Menthol/M-Salicylate 10-15% topical cream     prednisoLONE acetate (PRED FORTE) 1 % ophthalmic suspension Place 1 drop into both eyes 2 (two) times daily.     Rivaroxaban (XARELTO) 15 MG TABS tablet Take 15 mg by mouth daily with supper.     sacubitril-valsartan (ENTRESTO) 97-103 MG TAKE ONE-HALF TABLET BY MOUTH TWICE A DAY FOR HEART FAILURE     sildenafil (VIAGRA) 100 MG tablet Take 50 mg by mouth daily as needed for erectile dysfunction.     spironolactone (ALDACTONE) 25 MG tablet TAKE ONE TABLET BY MOUTH DAILY (FLUID PILL)     testosterone cypionate (DEPOTESTOSTERONE CYPIONATE) 200 MG/ML injection INJECT 0.3 MLS INTRAMUSCULARLY EVERY 14 DAYS     timolol (TIMOPTIC-XR) 0.5 % ophthalmic gel-forming Place 1 drop into both eyes daily.     traMADol (ULTRAM) 50 MG tablet Take 50 mg by mouth 3 (three) times daily as needed  for moderate pain. Maximum dose= 8 tablets per day For pain     urea 10 % lotion APPLY THIN LAYER TO AFFECTED AREA DAILY TO DRY SKIN /CALLUS REGIONS OF BOTH FEET     valACYclovir (VALTREX) 1000 MG tablet Take 1,000 mg by mouth daily.     No current facility-administered medications for this encounter.   Wt Readings from Last 3 Encounters:  11/04/22 111.2 kg (245 lb 3.2 oz)  05/25/22 112.9 kg (248 lb 12.8 oz)  05/13/22 113.4 kg (250 lb)   BP 108/76   Pulse 70   Wt 111.2 kg (245 lb 3.2 oz)   SpO2 95%   BMI 34.20 kg/m  Physical Exam General:  NAD. No resp difficulty, walked into clinic HEENT: Normal Neck: Supple. No JVD. Carotids 2+ bilat; no bruits. No lymphadenopathy or thryomegaly appreciated. Cor: PMI nondisplaced. Regular rate & rhythm. No rubs, gallops, 2/6 HSM LLSB Lungs: Clear Abdomen: Soft, nontender, nondistended. No hepatosplenomegaly. No bruits or masses. Good bowel sounds. Extremities: No cyanosis, clubbing, rash, edema Neuro: Alert & oriented x 3, cranial nerves grossly intact. Moves all 4 extremities w/o difficulty. Affect pleasant.  Assessment/Plan: 1. Chronic systolic CHF: Nonischemic cardiomyopathy.  Coronary CTA (7/23) with no significant CAD. Strong suspicion for cardiac sarcoidosis.  Patient has tissue diagnosis of sarcoidosis from lung biopsy. He has h/o frequent PVCs/NSVT as well as decreased LV systolic function and LGE on cMRI. Cardiac MRI in 10/20 with EF 50%, diffuse basal segment subendocardial LGE, apical inferoseptal LGE => not in coronary distribution, concern for cardiac sarcoidosis. Echo [facility] in 6/23 with EF 25-30%.  Echo at [facility] in 6/23 was a technically difficult study. Cardiac PET in 8/23 showed EF 43%, no abnormal metabolism to suggest active sarcoidosis. cMRI (2/24) showed LVEF improved 50%.  He is not volume overloaded on exam.  NYHA class I-II. - Change Lasix to 20 mg PRN - Continue Entresto 97/103 bid.  - Continue  spironolactone 25 mg daily. BMET/BNP today.  - Continue empagliflozin 12.5 mg daily.  - Continue Toprol XL 50 mg at bedtime. - Continue Lasix 20 mg daily. - He has seen EP. With  minimal LGE on cMRI, felt ICD not indicated. 2. Sarcoidosis: Patient has biopsy-diagnosed sarcoidosis with involvement of lungs, hilar lymphadenopathy, liver, and eyes. Strongly suspect cardiac sarcoidosis with concerning LGE pattern on 2020 MRI and PVCs/NSVT.  Diagnosis was made in the 1990s. He has been on mycophenolate in the past and is now on adalimumab (x 2 years).  He was started on prednisone due to frequent PVCs and NSVT on monitoring for more aggressive treatment of cardiac sarcoidosis.  He is now titrating down prednisone.  Cardiac PET at [facility] in 8/23 showed EF 43%, no abnormal metabolism to suggest active sarcoidosis. - He is now off prednisone.  - Repeat cardiac PET now that he is off prednisone to make sure inflammation remains quiescent.  - Continue rheumatology followup for Humira.  - He is now followed by Dr. Francine Graven for pulmonary component of sarcoidosis. 3. DVT: Spontaneous, x 2.   - Continue Xarelto. CBC and iron panel today. 4. 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  He has been started on amiodarone.  - Continue amiodarone 200 mg daily.  Recent LFTs normal, recent TSH mildly elevated but free T4 normal. He will need regular eye exam. - Zio 2 day (10/23) Mostly NSR, 1.5 % PVC burden.  Follow up in 3 months with Dr. Kathreen Cornfield [facility] FNP-BC 11/04/2022

## 2022-11-04 NOTE — Patient Instructions (Signed)
Medication Changes:  TAKE LASIX 20MG  DAILY AS NEEDED   Lab Work:  Labs done today, your results will be available in MyChart, we will contact you for abnormal readings.  Testing/Procedures:  FDG Viability Cardiac PET Scan Instructions Your doctor has ordered a cardiac viability PET/CT for you. This test uses a small amount of a radioactive imaging agent to look at the heart muscle. This test is helpful to check the blood supply to your heart and how well the heart muscle takes up sugar (viability). For this test you will have pictures of your heart taken twice with different imaging agents. Where To Go: Your test is scheduled at [facility], [address] [place] [place], [place] [address] When you arrive come in the main hospital entrance. At the front desk let them know you have an appointment in Radiology. They will give you a green card with instructions to get to Radiology. Check in at Radiology. After checking in, please be seated in the waiting room.  To Prepare for Your Test: No food or drink after midnight the night before your appointment. You may drink plain water (without additives or flavorings).  If you are anxious or claustrophobic, please speak with your referring physician about taking anxiety medications prior to your appointment. We are not able to prescribe or provide anxiety medication for this test.  Avoid heavy lifting or strenuous exercise or activity the day before your test. You may take your daily medications per usual - exceptions below: If you use long-acting insulin, please take half your usual dose the evening before the test (including but not limited to: Lantus, Toujeo, Basaglar, Tresiba, Levemir, Humalog N, Humulin N, Novolog N, or Novalin N).  If you are on a continuous glucose monitor, you may have to remove it for the test. Please plan accordingly.   On the Day of Your Cardiac Viability PET/CT test: Wear loose, comfortable clothing. Leave jewelry  and valuables at home.  Do not take any insulin or oral medications for diabetes the morning of your test (including but not limited to metformin also know as Glucophage). Bring a list of your medications with you and bring any medications that you may need to take throughout the day.  After checking in for your test, a technologist or nurse will explain the procedure and answer any questions you may have. An IV will be started in your arm and EKG patches will be placed on your chest. You will need to drink a sugary drink. At this point you will lay down on your back on the PET/CT scanner and we will connect the EKG patches to leads that are attached to the scanner. This allows [place] to measure your heart's activity during the scan. You will be given a small amount of a radioactive imaging agent and we will take continuous pictures for 7 minutes.  After imaging, you will be taken to a room with a recliner and your blood sugar will be checked. We will monitor your blood sugar several times during the test. If needed, you may be given insulin to help get your sugar levels within range for the second part of the test.  Once your blood sugar is in acceptable range, you will be given a second imaging agent. Once this imaging agent is administered through your IV, we will have you wait in the room for about 60 minutes. You can sleep, bring music to listen to/play on your phone, or watch TV while you wait. After the  60-minute wait we will bring you back to the PET/CT scanner and take a second set of images which will take approximately 10 minutes.  Please allow for 3 to 4 hours for the entire appointment.  After Your Cardiac Viability PET/CT: Drink plenty of fluids and eat a good meal within 2 hours of leaving the hospital. It is encouraged to urinate frequently for a couple of hours after your test.  Plan to eat a snack before bedtime. You will continue your diabetic medicines, including insulin, as prescribed  after the test in finished. Your doctor will contact you with your test results in 7 to 10 business days.   For Questions About Your Test: Please call our Cardiac Imaging Nurse Navigators at [phone].  Rockwell Alexandria, Cardiac Imaging Nurse Navigator  Larey Brick, Cardiac Imaging Nurse Navigator For more information and frequently asked questions, please visit our website : [email or web address]    Cardiac Sarcoidosis/Inflammation PET Scan  Food Diary Name: _____________________________ Please fill in EXACTLY what you have eaten and when for 24 hours PRIOR to your test date.  Time Food/Drink Comments  Breakfast                Lunch                Dinner                Snacks                 DO NOT EXERCISE THE DAY BEFORE YOUR TEST DO NOT EAT AFTER 5 PM THE DAY BEFORE YOUR TEST.  ON THE DAY OF YOUR TEST, DO NOT EAT ANY FOOD AND ONLY DRINK CLEAR WATER! PLEASE BRING THIS FOOD DIARY WITH YOU TO YOUR APPOINTMENT    Cardiac Sarcoidosis/Inflammation PET Scan Patient Instructions  Please report to Radiology at the [facility] Main Entrance 15 minutes early for your test.  [address] [place], [place] [address] BRING FOOD DIARY WITH YOU TO THIS APPOINTMENT For 24 hours before the test: Do not exercise! Do not eat after 5 pm the day before your test! To make sure that your scanning results are accurate, you MUST follow the sarcoid prep meal diet starting the day before your PET scan. This diet involves eating no carbohydrates 24 hours before the test.  You will keep a log of all that you eat the day before your test. If you have questions or do not understand this diet, please call [phone] for more information. If you are unable to follow this diet, please discuss an alternative strategy with the coordinator.  If you are diabetic, continue your diabetes medications as usual on the day before until you begin to fast. NO DIABETES MEDICATIONS  ONCE YOU BEGIN TO FAST. What foods can I eat the day before my test?  Drink only water or black coffee (WITHOUT sugar, artificial sweetener, cream, or milk). Eggs (prepared without milk or cheese)  Meat that is either broiled or pan fried in butter WITHOUT breading (chicken, [place], bacon, meat-only sausage, hamburger, steak, fish) Butter, salt & pepper What foods must I AVOID the day before my test?  Do not consume alcoholic beverages, sodas, fruit juice, coffee creamer, or sports drinks  Do not eat vegetables, beans, nuts, fruits, juices, bread, grains, rice, pasta, potatoes, or any baked goods Do not eat dairy products (milk, cheese, etc.)  Do not eat mayonnaise, ketchup, tartar sauce, mustard, or other condiments Do not add sugar, artificial sweeteners,  or Splenda (sucralose) to foods or drinks  Do not eat breaded foods (like fried chicken)  Do not eat sweets, candy, gum, sweetened cough drops, lozenges, or sugar  Do not eat sweetened, grilled, or cured meats or meat with carbohydrate-containing additives (some sausages, ham, sweetened bacon) Suggested items for breakfast, lunch, or dinner:  Breakfast  3 to 5 fatty sausage links fried in butter. 3 to 5 bacon strips.  3 eggs pan fried in butter (no milk or cheese).  Lunch/Dinner  2 hamburger patties fried in butter. Chicken or fatty fish pan fried in butter. No breading. 8 oz. fatty steak pan fried in butter.  Beverages  Drink only water or black coffee. DO NOT ADD SUGAR, ARTIFICIAL SWEETENER, CREAM, OR MILK   For more information and frequently asked questions, please visit our website : [email or web address]  Follow-Up in: 3 MONTHS AS SCHEDULED   At the [facility], you and your health needs are our priority. We have a designated team specialized in the treatment of Heart Failure. This Care Team includes your primary Heart Failure Specialized Cardiologist (physician), Advanced Practice Providers (APPs-  Physician Assistants and Nurse Practitioners), and Pharmacist who all work together to provide you with the care you need, when you need it.   You may see any of the following providers on your designated Care Team at your next follow up:  Dr. Arvilla Meres Dr. Marca Ancona Dr. Marcos Eke, NP Robbie Lis, [facility] [place], [place] Brynda Peon, NP Karle Plumber, PharmD   Please be sure to bring in all your medications bottles to every appointment.   Need to Contact us:  If you have any questions or concerns before your next appointment please send [place] a message through [facility] or call our office at [phone].    TO LEAVE A MESSAGE FOR THE NURSE SELECT OPTION 2, PLEASE LEAVE A MESSAGE INCLUDING: YOUR NAME DATE OF BIRTH CALL BACK NUMBER REASON FOR CALL**this is important as we prioritize the call backs  YOU WILL RECEIVE A CALL BACK THE SAME DAY AS LONG AS YOU CALL BEFORE 4:00 PM

## 2022-11-11 ENCOUNTER — Telehealth (HOSPITAL_COMMUNITY): Payer: Self-pay | Admitting: *Deleted

## 2022-11-11 NOTE — Telephone Encounter (Signed)
No pre cert reqd for cardiac pet scan  Routed to [facility] cardiac pet pool

## 2022-12-11 ENCOUNTER — Encounter (HOSPITAL_COMMUNITY): Payer: Self-pay

## 2022-12-14 ENCOUNTER — Telehealth (HOSPITAL_COMMUNITY): Payer: Self-pay | Admitting: Emergency Medicine

## 2022-12-14 NOTE — Telephone Encounter (Signed)
Attempted to call patient regarding upcoming cardiac PET appointment. Left message on voicemail with name and callback number Aidan Moten RN Navigator Cardiac Imaging [facility] [phone] Office [phone] Cell  

## 2022-12-16 ENCOUNTER — Ambulatory Visit (HOSPITAL_COMMUNITY): Admission: RE | Admit: 2022-12-16 | Payer: No Typology Code available for payment source | Source: Ambulatory Visit

## 2023-01-18 ENCOUNTER — Telehealth (HOSPITAL_COMMUNITY): Payer: Self-pay | Admitting: *Deleted

## 2023-01-18 NOTE — Telephone Encounter (Signed)
Attempted to call patient regarding upcoming cardiac PET appointment. Left message on voicemail with name and callback number  Oza Oberle RN Navigator Cardiac Imaging [facility] [phone] Office [phone] Cell  

## 2023-01-19 ENCOUNTER — Telehealth (HOSPITAL_COMMUNITY): Payer: Self-pay | Admitting: Emergency Medicine

## 2023-01-19 NOTE — Telephone Encounter (Signed)
Reaching out to patient to offer assistance regarding upcoming cardiac imaging study; pt verbalizes understanding of appt date/time, parking situation and where to check in, pre-test NPO status and medications ordered, and verified current allergies; name and call back number provided for further questions should they arise Rockwell Alexandria RN Navigator Cardiac Imaging [facility] [phone] office [phone] cell  Reiterated the carb free diet. He reported he has not ate yet today and will follow the diet as I explained, then fast after 5pm. Will arrive tomorrow at 630a

## 2023-01-20 ENCOUNTER — Ambulatory Visit (HOSPITAL_COMMUNITY)
Admission: RE | Admit: 2023-01-20 | Discharge: 2023-01-20 | Disposition: A | Discharge status: Home / Self Care | Payer: No Typology Code available for payment source | Source: Ambulatory Visit | Attending: Family Medicine | Admitting: Family Medicine

## 2023-01-20 DIAGNOSIS — D869 Sarcoidosis, unspecified: Secondary | ICD-10-CM | POA: Diagnosis not present

## 2023-01-20 MED ORDER — RUBIDIUM RB82 GENERATOR (RUBYFILL)
29.0600 | PACK | Freq: Once | INTRAVENOUS | Status: AC
  Administered 2023-01-20: 29.06 via INTRAVENOUS

## 2023-01-20 MED ORDER — FLUDEOXYGLUCOSE F - 18 (FDG) INJECTION
8.5000 | Freq: Once | INTRAVENOUS | Status: AC
  Administered 2023-01-20: 8.5 via INTRAVENOUS

## 2023-01-21 LAB — NM PET CT MYOCARDIAL SARCOIDOSIS
Nuc Stress EF: 38 %
Rest Nuclear Isotope Dose: 29.1 mCi

## 2023-01-22 ENCOUNTER — Telehealth (HOSPITAL_COMMUNITY): Payer: Self-pay

## 2023-01-22 ENCOUNTER — Ambulatory Visit: Payer: Medicare Other | Admitting: Cardiology

## 2023-01-22 NOTE — Telephone Encounter (Signed)
Spoke with patient regarding the following results. Patient made aware and patient verbalized understanding.

## 2023-01-22 NOTE — Telephone Encounter (Signed)
-----   Message from Marca Ancona sent at 01/22/2023  2:09 PM EST ----- No active inflammation on PET, ok to stay off prednisone and on Humira.

## 2023-01-27 ENCOUNTER — Encounter (HOSPITAL_COMMUNITY): Payer: No Typology Code available for payment source

## 2023-01-28 ENCOUNTER — Encounter (HOSPITAL_COMMUNITY): Payer: Self-pay | Admitting: Cardiology

## 2023-01-28 ENCOUNTER — Ambulatory Visit (HOSPITAL_COMMUNITY)
Admission: RE | Admit: 2023-01-28 | Discharge: 2023-01-28 | Disposition: A | Discharge status: Home / Self Care | Payer: Medicare Other | Source: Ambulatory Visit | Attending: Cardiology | Admitting: Cardiology

## 2023-01-28 ENCOUNTER — Telehealth (HOSPITAL_COMMUNITY): Payer: Self-pay | Admitting: *Deleted

## 2023-01-28 VITALS — BP 100/60 | HR 65 | Wt 249.4 lb

## 2023-01-28 DIAGNOSIS — I428 Other cardiomyopathies: Secondary | ICD-10-CM | POA: Diagnosis not present

## 2023-01-28 DIAGNOSIS — R590 Localized enlarged lymph nodes: Secondary | ICD-10-CM | POA: Diagnosis not present

## 2023-01-28 DIAGNOSIS — Z79899 Other long term (current) drug therapy: Secondary | ICD-10-CM | POA: Insufficient documentation

## 2023-01-28 DIAGNOSIS — I444 Left anterior fascicular block: Secondary | ICD-10-CM | POA: Insufficient documentation

## 2023-01-28 DIAGNOSIS — Z86718 Personal history of other venous thrombosis and embolism: Secondary | ICD-10-CM | POA: Diagnosis not present

## 2023-01-28 DIAGNOSIS — I130 Hypertensive heart and chronic kidney disease with heart failure and stage 1 through stage 4 chronic kidney disease, or unspecified chronic kidney disease: Secondary | ICD-10-CM | POA: Insufficient documentation

## 2023-01-28 DIAGNOSIS — D869 Sarcoidosis, unspecified: Secondary | ICD-10-CM | POA: Diagnosis not present

## 2023-01-28 DIAGNOSIS — I472 Ventricular tachycardia, unspecified: Secondary | ICD-10-CM | POA: Diagnosis not present

## 2023-01-28 DIAGNOSIS — I5022 Chronic systolic (congestive) heart failure: Secondary | ICD-10-CM

## 2023-01-28 DIAGNOSIS — Z7901 Long term (current) use of anticoagulants: Secondary | ICD-10-CM | POA: Diagnosis not present

## 2023-01-28 LAB — COMPREHENSIVE METABOLIC PANEL
ALT: 22 U/L (ref 0–44)
AST: 25 U/L (ref 15–41)
Albumin: 4 g/dL (ref 3.5–5.0)
Alkaline Phosphatase: 56 U/L (ref 38–126)
Anion gap: 7 (ref 5–15)
BUN: 21 mg/dL — ABNORMAL HIGH (ref 6–20)
CO2: 26 mmol/L (ref 22–32)
Calcium: 9.5 mg/dL (ref 8.9–10.3)
Chloride: 105 mmol/L (ref 98–111)
Creatinine, Ser: 1.83 mg/dL — ABNORMAL HIGH (ref 0.61–1.24)
GFR, Estimated: 43 mL/min — ABNORMAL LOW (ref >60)
Glucose, Bld: 94 mg/dL (ref 70–99)
Potassium: 4.8 mmol/L (ref 3.5–5.1)
Sodium: 138 mmol/L (ref 135–145)
Total Bilirubin: 0.8 mg/dL (ref <1.2)
Total Protein: 7.2 g/dL (ref 6.5–8.1)

## 2023-01-28 LAB — TSH: TSH: 4.045 u[IU]/mL (ref 0.350–4.500)

## 2023-01-28 LAB — BRAIN NATRIURETIC PEPTIDE: B Natriuretic Peptide: 29.6 pg/mL (ref 0.0–100.0)

## 2023-01-28 MED ORDER — FUROSEMIDE 20 MG PO TABS: 20.0000 mg | ORAL_TABLET | Freq: Every day | ORAL | 3 refills | Status: DC

## 2023-01-28 MED ORDER — METOPROLOL SUCCINATE ER 50 MG PO TB24: 75.0000 mg | ORAL_TABLET | Freq: Every day | ORAL | 3 refills | Status: AC

## 2023-01-28 MED ORDER — ENTRESTO 49-51 MG PO TABS: 1.0000 | ORAL_TABLET | Freq: Two times a day (BID) | ORAL | 11 refills | Status: DC

## 2023-01-28 MED ORDER — FUROSEMIDE 20 MG PO TABS
20.0000 mg | ORAL_TABLET | ORAL | 3 refills | Status: DC
Start: 2023-01-28 — End: 2023-07-16

## 2023-01-28 NOTE — Telephone Encounter (Signed)
Called patient per Dr. Shirlee Latch with following lab results and instructions:  "Creatinine elevated at 1.8, would have him take Lasix 20 mg every other day rather than daily."  Pt verbalized understanding of same. Med list updated.

## 2023-01-28 NOTE — Progress Notes (Signed)
PCP: [facility] Cardiology: Dr. Lynnette Caffey EP: Dr. Lalla Brothers HF Cardiology: Dr. Shirlee Latch  57 y.o. 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  Sarcoidosis was diagnosed in 1990s by lung biopsy.  He has no family history of sarcoidosis, it was thought to possibly be triggered by exposures during the [place] War (patient served in the army).  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 Back in 10/20, cardiac MRI was concerning for CS => subendocardial diffuse basal LGE not in a coronary distribution.  Echo in 10/21 showed EF 40-45%.  Most recent echo at [facility] was a difficult study, EF reported as 50-55% but likely worse.  Echo in 6/23 at [facility] showed EF 25-30%. Patient had a Zio monitor in 5/23 showing 19% PVCs.  He was seen by Dr. Lynnette Caffey who was concerned for active cardiac sarcoidosis.  Coronary CTA was done in 7/23 showed no significant CAD. Patient was started on po steroids by Dr. Lynnette Caffey and was referred to Dr. Lalla Brothers for EP evaluation.  Patient has been on adalimumab through his rheumatologist at the [facility] for 2 years. Prior to this, he was on mycophenolate.  He says that he has not taken methotrexate.   Cardiac PET in 8/23 showed EF 43%, no abnormal metabolism to suggest active sarcoidosis.   Zio 2 day (10/23) Mostly NSR, 1.5 % PVC burden.  cMRI (2/24) showed LVEF 50% with severe dilation, RVEF  51%, mitral valve with evidence of prior endocarditis with mild MR, basal LGE (resolution of prior apical LGE).  Repeat cardiac PET in 11/24 showed EF 38%, no active inflammation.   Today he returns for cardiac sarcoidosis follow up. He continues on  Humira through his rheumatologist (at the [facility]). Weight has been up recently, he says that he was on a trip to [place] recently and ate out a lot. He started taking Lasix 20 mg daily about a week ago.  No dyspnea walking up a flight of stairs.  He walks about 1 mile/day on the treadmill with no problems.  Some general fatigue. No chest pain.  No lightheadedness or palpitations.  No orthopnea/PND.    ECG (personally reviewed): NSR, 1st degree AVB, IVCD 122 msec  Labs (6/23): K 4.7, creatinine 1.5 Labs (7/23): hgb 18.7 Labs (3/24): hgb 15.8, TSH mildly elevated, normal T4 Labs (8/24): BNP 44, K 4.7, creatinine 1.55  PMH: 1. HTN 2. CKD stage 3 3. DVT: Spontaneous, 2007 and again in 2008.  4. OSA 5. 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 H/o mitral valve endocarditis: 2018, Strep bacteremia.  7. Erectile dysfunction 8. Sarcoidosis: Ocular, pulmonary, cardiac, and hepatic.  Diagnosed in 1990s via lung biopsy.  9. NSVT/PVCs: Zio monitor (5/23) with 19% PVCs and 1 run NSVT.  - Zio monitor (8/23): 13.7% PVCs - Zio 2 day (10/23): Mostly NSR, 1.5 % PVC burden. 10. Chronic systolic CHF: Thought to be due to cardiac sarcoidosis.  - Coronary CTA (7/23): Calcium score 0, normal coronaries.  - Echo (10/21): EF 40-45% - cMRI (10/20): EF 50%, diffuse basal segment subendocardial LGE, apical inferoseptal LGE => not in coronary distribution, concern for cardiac sarcoidosis.  - Echo (6/23, [facility]): EF 50-55%, mild LVH, RV  normal (difficult images, EF probably worse than reported).  - Echo (6/23, [facility] hospital): EF 25-30%, akinesis basal anteroseptal wall.  - Cardiac PET (8/23): EF 43%, no abnormal metabolism to suggest active sarcoidosis. - cMRI (2/24):  LVEF 50% with severe dilation, RVEF  51%, mitral valve with evidence of prior endocarditis with mild MR, basal LGE (resolution of prior apical LGE). - Cardiac PET (11/24): EF 38%, no active inflammation. ,  Social History   Socioeconomic History    Marital status: Married    Spouse name: Not on file   Number of children: Not on file   Years of education: Not on file   Highest education level: Not on file  Occupational History   Not on file  Tobacco Use   Smoking status: Former    Current packs/day: 0.12    Average packs/day: 0.1 packs/day for 4.0 years (0.5 ttl pk-yrs)    Types: Cigarettes   Smokeless tobacco: Never   Tobacco comments:    08/21/2017 "stopped in the 1990s"  Vaping Use   Vaping status: Never Used  Substance and Sexual Activity   Alcohol use: Yes    Comment: 08/31/2017 "might have a margarita once/month; if that"   Drug use: Never   Sexual activity: Yes  Other Topics Concern   Not on file  Social History Narrative   Not on file   Social Determinants of Health   Financial Resource Strain: Not on file  Food Insecurity: Not on file  Transportation Needs: Not on file  Physical Activity: Not on file  Stress: Not on file  Social Connections: Not on file  Intimate Partner Violence: Not on file   Family History  Problem Relation Age of Onset   Breast cancer Mother    Multiple sclerosis Father    Multiple sclerosis Brother    Diabetes Brother    ROS: All systems reviewed and negative except as per HPI.   Current Outpatient Medications  Medication Sig Dispense Refill   acetaminophen (TYLENOL) 500 MG tablet Take 1,000 mg by mouth 4 (four) times daily as needed for mild pain.     Adalimumab 40 MG/0.4ML PNKT INJECT 40MG  (0.4ML) SUBCUTANEOUSLY EVERY OTHER WEEK     albuterol (PROVENTIL HFA;VENTOLIN HFA) 108 (90 Base) MCG/ACT inhaler Inhale 1-2 puffs into the lungs every 6 (six) hours as needed for wheezing or shortness of breath.     amiodarone (PACERONE) 200 MG tablet Take 1 tablet (200 mg total) by mouth daily. 90 tablet 3   Calcium Carbonate-Vit D-Min (CALCIUM 1200) 1200-1000 MG-UNIT CHEW Chew 1,200 mg by mouth every morning. 30 tablet 5   capsaicin (ZOSTRIX) 0.025 % cream Apply 1 application topically 2  (two) times daily as needed (skin care).      carboxymethylcellulose (REFRESH PLUS) 0.5 % SOLN Place 1 drop into both eyes 2 (two) times daily.     Cholecalciferol 50 MCG (2000 UT) TABS TAKE ONE TABLET BY MOUTH DAILY *NOTE CHANGE IN STRENGTH*     cyclobenzaprine (FLEXERIL) 10 MG tablet Take 10 mg by mouth every 8 (eight) hours as needed for muscle spasms.     cycloSPORINE (RESTASIS) 0.05 % ophthalmic emulsion INSTILL 1 DROP IN EACH EYE EVERY 12 HOURS     diclofenac Sodium (VOLTAREN) 1 % GEL APPLY 2 GRAMS TO AFFECTED AREA TWICE A DAY AS NEEDED     diphenhydrAMINE (BENADRYL) 50 MG capsule Take 50 mg by mouth at bedtime.     empagliflozin (JARDIANCE) 25 MG TABS tablet TAKE ONE-HALF TABLET  BY MOUTH ONCE DAILY FOR HEART FAILURE     gabapentin (NEURONTIN) 400 MG capsule Take 3 capsules by mouth at bedtime.     homatropine 2 % ophthalmic solution Place 2 drops into both eyes 2 (two) times daily.     nepafenac (ILEVRO) 0.3 % ophthalmic suspension INSTILL 1 DROP IN EACH EYE DAILY     omeprazole (PRILOSEC) 20 MG capsule Take 1 capsule (20 mg total) by mouth daily. 175 capsule 0   OVER THE COUNTER MEDICATION Apply 1 application topically 2 (two) times daily as needed (pain). Menthol/M-Salicylate 10-15% topical cream     prednisoLONE acetate (PRED FORTE) 1 % ophthalmic suspension Place 1 drop into both eyes 2 (two) times daily.     Rivaroxaban (XARELTO) 15 MG TABS tablet Take 15 mg by mouth daily with supper.     sacubitril-valsartan (ENTRESTO) 49-51 MG Take 1 tablet by mouth 2 (two) times daily. 60 tablet 11   sildenafil (VIAGRA) 100 MG tablet Take 50 mg by mouth daily as needed for erectile dysfunction.     spironolactone (ALDACTONE) 25 MG tablet TAKE ONE TABLET BY MOUTH DAILY (FLUID PILL)     testosterone cypionate (DEPOTESTOSTERONE CYPIONATE) 200 MG/ML injection INJECT 0.3 MLS INTRAMUSCULARLY EVERY 14 DAYS     timolol (TIMOPTIC-XR) 0.5 % ophthalmic gel-forming Place 1 drop into both eyes daily.      traMADol (ULTRAM) 50 MG tablet Take 50 mg by mouth 3 (three) times daily as needed for moderate pain. Maximum dose= 8 tablets per day For pain     urea 10 % lotion APPLY THIN LAYER TO AFFECTED AREA DAILY TO DRY SKIN /CALLUS REGIONS OF BOTH FEET     valACYclovir (VALTREX) 1000 MG tablet Take 1,000 mg by mouth daily.     furosemide (LASIX) 20 MG tablet Take 1 tablet (20 mg total) by mouth every other day. 45 tablet 3   metoprolol succinate (TOPROL XL) 50 MG 24 hr tablet Take 1.5 tablets (75 mg total) by mouth at bedtime. 90 tablet 3   No current facility-administered medications for this encounter.   Wt Readings from Last 3 Encounters:  01/28/23 113.1 kg (249 lb 6.4 oz)  11/04/22 111.2 kg (245 lb 3.2 oz)  05/25/22 112.9 kg (248 lb 12.8 oz)   BP 100/60   Pulse 65   Wt 113.1 kg (249 lb 6.4 oz)   SpO2 97%   BMI 34.78 kg/m  General: NAD Neck: Thick, JVP difficult, no thyromegaly or thyroid nodule.  Lungs: Clear to auscultation bilaterally with normal respiratory effort. CV: Nondisplaced PMI.  Heart regular S1/S2, no S3/S4, no murmur.  No peripheral edema.  No carotid bruit.  Normal pedal pulses.  Abdomen: Soft, nontender, no hepatosplenomegaly, no distention.  Skin: Intact without lesions or rashes.  Neurologic: Alert and oriented x 3.  Psych: Normal affect. Extremities: No clubbing or cyanosis.  HEENT: Normal.   Assessment/Plan: 1. Chronic systolic CHF: Nonischemic cardiomyopathy.  Coronary CTA (7/23) with no significant CAD. Strong suspicion for cardiac sarcoidosis.  Patient has tissue diagnosis of sarcoidosis from lung biopsy. He has h/o frequent PVCs/NSVT as well as decreased LV systolic function and LGE on cMRI. Cardiac MRI in 10/20 with EF 50%, diffuse basal segment subendocardial LGE, apical inferoseptal LGE => not in coronary distribution, concern for cardiac sarcoidosis. Echo from [facility] in 6/23 with EF 25-30%.  Echo at [facility] in 6/23 was a technically difficult  study. Cardiac PET in 8/23 showed EF 43%, no abnormal metabolism to suggest  active sarcoidosis. cMRI (2/24) showed LVEF improved 50%.  Cardiac PET repeated in 11/24 showed EF 38%, no active inflammation.  He is not volume overloaded on exam.  NYHA class I-II. - He can continue Lasix 20 mg daily for now, but will get BMET today to check creatinine.  - Continue Entresto 49/51 bid.  - Continue spironolactone 25 mg daily.  - Continue empagliflozin 12.5 mg daily.  - Increase Toprol XL to 75 mg at bedtime. - He has seen EP. With minimal LGE on last cMRI, felt ICD not indicated. - I will arrange for repeat echo.  2. Sarcoidosis: Patient has biopsy-diagnosed sarcoidosis with involvement of lungs, hilar lymphadenopathy, liver, and eyes. Strongly suspect cardiac sarcoidosis with concerning LGE pattern on 2020 MRI and PVCs/NSVT.  Diagnosis was made in the 1990s. He has been on mycophenolate in the past and is now on adalimumab (x 2 years).  He was started on prednisone due to frequent PVCs and NSVT on monitoring for more aggressive treatment of cardiac sarcoidosis.  He is now off prednisone.  Cardiac PET at [facility] in 8/23 showed EF 43%, no abnormal metabolism to suggest active sarcoidosis, repeat cardiac PET in 11/24 again showed EF 38% with no active inflammation suggestive of active sarcoidosis. - Continue rheumatology followup for Humira.  - He is now followed by Dr. Francine Graven for pulmonary component of sarcoidosis. 3. DVT: Spontaneous, x 2.   - Continue Xarelto.  4. 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  He has been started on amiodarone.  - Continue amiodarone 200 mg daily.  Check LFTs and TSH. He will need regular eye exam.  Follow up in 3 months with APP  Marca Ancona  01/28/2023

## 2023-01-28 NOTE — Progress Notes (Unsigned)
Cardiology Clinic Note    Date:  01/28/2023  Patient ID:  Richard Shepherd, Richard Shepherd [birth year], MRN [id number] PCP:  [facility]  Cardiologist:  Orbie Pyo, MD HF Cardiologist: Dr. Shirlee Latch Electrophysiologist: Lanier Prude, MD  ***refresh   Patient Profile    Chief Complaint: 6mon follow-up  History of Present Illness: Richard Shepherd is a 57 y.o. male with PMH notable for PVCs, HFimpEF, NSVT, sarcoidosis (ocular, pulm, hepatic, cardiac), DVT x 2, HTN, CKD-3; seen today for Lanier Prude, MD for routine electrophysiology followup.  He last saw Dr. Lalla Brothers 05/2022 for ongoing discussion of ICD.  His EF had significantly improved, so ICD was not recommended. He saw Dr. Shirlee Latch yesterday  On follow-up today,  *** palpitations *** fluid status, exercise limitations  Does not need amio labs  Since last being seen in our clinic the patient reports doing ***.  he denies chest pain, palpitations, dyspnea, PND, orthopnea, nausea, vomiting, dizziness, syncope, edema, weight gain, or early satiety.       AAD History: Amiodarone     ROS:  Please see the history of present illness. All other systems are reviewed and otherwise negative.    Physical Exam    VS:  There were no vitals taken for this visit. BMI: There is no height or weight on file to calculate BMI.  Wt Readings from Last 3 Encounters:  11/04/22 245 lb 3.2 oz (111.2 kg)  05/25/22 248 lb 12.8 oz (112.9 kg)  05/13/22 250 lb (113.4 kg)     GEN- The patient is well appearing, alert and oriented x 3 today.   Lungs- Clear to ausculation bilaterally, normal work of breathing.  Heart- {Blank single:19197::"Regular","Irregularly irregular"} rate and rhythm, no murmurs, rubs or gallops Extremities- {EDEMA LEVEL:28147::"No"} peripheral edema, warm, dry Skin-  *** device pocket well-healed, no tethering   Device interrogation done today and reviewed by myself:  Battery *** Lead thresholds,  impedence, sensing stable *** *** episodes *** changes made today   Studies Reviewed   Previous EP, cardiology notes.    EKG {ACTION; IS/IS YNW:29562130} ordered. Personal review of EKG from {Blank single:19197::"today","***"} shows:  ***        NM Pet CT myocardial sarcoidosis, 01/20/2023 Sarcoid Defect    FDG uptake was not observed. LV perfusion is normal.  Ejection Fraction    Left ventricular function is abnormal. Global function is moderately reduced. EF: 38%. End diastolic cavity size is severely enlarged. End systolic cavity size is moderately enlarged.  Calcium Scoring    Coronary calcium was absent on the attenuation correction CT images.  Sarcoidosis Conclusion    FDG uptake findings are inconsistent with active myocardial inflammation/sarcoidosis.   Cardiac MRI, 04/23/2022 LVEF 50% with severe LV dilation.   Compared to outside reporting (prior study) Basal LGE persists with resolution of prior apical LGE. Clinically this may represent improvement of cardiac sarcoidosis activity on immunosuppressive therapy.   Mitral valve is suggestive of prior endocarditis. Free breathing study limited accurate MR quantification.  Long term monitor, 01/02/2022 1. One 4 beat runs NSVT 2. 1.5% PVCs 3. Predominantly NSR  Long-term monitor, 10/27/2021 1. Primarily NSR.  2. 13.7% PVCs, frequent.   TTE, 09/05/2021  1. Left ventricular ejection fraction, by estimation, is 50 to 55%. The left ventricle has low normal function. The left ventricle has no regional wall motion abnormalities. The left ventricular internal cavity size was moderately dilated. There is mild concentric left ventricular hypertrophy. Left ventricular diastolic parameters were  normal.   2. Right ventricular systolic function is normal. The right ventricular size is normal. Tricuspid regurgitation signal is inadequate for assessing PA pressure.   3. Left atrial size was mildly dilated.   4. The mitral valve is abnormal.  No evidence of mitral valve regurgitation. No evidence of mitral stenosis. There is mild late systolic prolapse of of the mitral valve.   5. The aortic valve is normal in structure. Aortic valve regurgitation is not visualized. No aortic stenosis is present.   6. The inferior vena cava is normal in size with greater than 50% respiratory variability, suggesting right atrial pressure of 3 mmHg.   7. There is right bowing of the interatrial septum, suggestive of elevated left atrial pressure.    Assessment and Plan     #) NSVT #) PVCs   #) CHF Follows regularly with Dr. Asher Muir you ordering a CV Procedure (e.g. stress test, cath, DCCV, TEE, etc)?   Press F2        :[id number]}   Current medicines are reviewed at length with the patient today.   The patient {ACTIONS; HAS/DOES NOT HAVE:19233} concerns regarding his medicines.  The following changes were made today:  {NONE DEFAULTED:18576}  Labs/ tests ordered today include: *** No orders of the defined types were placed in this encounter.    Disposition: Follow up with {EPMDS:28135} or EP APP {EPFOLLOW UP:28173}   Signed, Sherie Don, NP  01/28/23  9:23 AM  Electrophysiology [facility]

## 2023-01-28 NOTE — Patient Instructions (Signed)
DECREASE Entresto to 49/51 mg Twice daily  INCREASE Toprol XL to 75 mg ( 1 1/2 tab) daily.  CHANGE Lasix to 20 mg daily.  Labs done today, your results will be available in MyChart, we will contact you for abnormal readings.  Your physician has requested that you have an echocardiogram. Echocardiography is a painless test that uses sound waves to create images of your heart. It provides your doctor with information about the size and shape of your heart and how well your heart's chambers and valves are working. This procedure takes approximately one hour. There are no restrictions for this procedure. Please do NOT wear cologne, perfume, aftershave, or lotions (deodorant is allowed). Please arrive 15 minutes prior to your appointment time.  Please note: We ask at that you not bring children with you during ultrasound (echo/ vascular) testing. Due to room size and safety concerns, children are not allowed in the ultrasound rooms during exams. Our front office staff cannot provide observation of children in our lobby area while testing is being conducted. An adult accompanying a patient to their appointment will only be allowed in the ultrasound room at the discretion of the ultrasound technician under special circumstances. We apologize for any inconvenience.  Your physician recommends that you schedule a follow-up appointment in: 3 months.  If you have any questions or concerns before your next appointment please send [place] a message through [place] or call our office at [phone].    TO LEAVE A MESSAGE FOR THE NURSE SELECT OPTION 2, PLEASE LEAVE A MESSAGE INCLUDING: YOUR NAME DATE OF BIRTH CALL BACK NUMBER REASON FOR CALL**this is important as we prioritize the call backs  YOU WILL RECEIVE A CALL BACK THE SAME DAY AS LONG AS YOU CALL BEFORE 4:00 PM  At the [facility], you and your health needs are our priority. As part of our continuing mission to provide you with  exceptional heart care, we have created designated Provider Care Teams. These Care Teams include your primary Cardiologist (physician) and Advanced Practice Providers (APPs- Physician Assistants and Nurse Practitioners) who all work together to provide you with the care you need, when you need it.   You may see any of the following providers on your designated Care Team at your next follow up: Dr Arvilla Meres Dr Marca Ancona Dr. Dorthula Nettles Dr. Clearnce Hasten Amy Filbert Schilder, NP Robbie Lis, [place] [facility] [place], [place] Brynda Peon, NP [place] Lee, NP Karle Plumber, PharmD   Please be sure to bring in all your medications bottles to every appointment.    Thank you for choosing [facility]

## 2023-01-29 ENCOUNTER — Ambulatory Visit (INDEPENDENT_AMBULATORY_CARE_PROVIDER_SITE_OTHER): Payer: Medicare Other | Admitting: Cardiology

## 2023-01-29 ENCOUNTER — Encounter: Payer: Self-pay | Admitting: Cardiology

## 2023-01-29 VITALS — BP 100/60 | HR 66 | Ht 71.0 in | Wt 251.0 lb

## 2023-01-29 DIAGNOSIS — I5042 Chronic combined systolic (congestive) and diastolic (congestive) heart failure: Secondary | ICD-10-CM

## 2023-01-29 DIAGNOSIS — I472 Ventricular tachycardia, unspecified: Secondary | ICD-10-CM

## 2023-01-29 DIAGNOSIS — I493 Ventricular premature depolarization: Secondary | ICD-10-CM

## 2023-03-05 ENCOUNTER — Ambulatory Visit (HOSPITAL_COMMUNITY)
Admission: RE | Admit: 2023-03-05 | Discharge: 2023-03-05 | Disposition: A | Discharge status: Home / Self Care | Payer: Medicare Other | Source: Ambulatory Visit | Attending: Cardiology | Admitting: Cardiology

## 2023-03-05 DIAGNOSIS — I319 Disease of pericardium, unspecified: Secondary | ICD-10-CM | POA: Diagnosis not present

## 2023-03-05 DIAGNOSIS — I341 Nonrheumatic mitral (valve) prolapse: Secondary | ICD-10-CM | POA: Insufficient documentation

## 2023-03-05 DIAGNOSIS — G4730 Sleep apnea, unspecified: Secondary | ICD-10-CM | POA: Insufficient documentation

## 2023-03-05 DIAGNOSIS — I340 Nonrheumatic mitral (valve) insufficiency: Secondary | ICD-10-CM | POA: Insufficient documentation

## 2023-03-05 DIAGNOSIS — J45909 Unspecified asthma, uncomplicated: Secondary | ICD-10-CM | POA: Insufficient documentation

## 2023-03-05 DIAGNOSIS — I5022 Chronic systolic (congestive) heart failure: Secondary | ICD-10-CM | POA: Diagnosis present

## 2023-03-05 DIAGNOSIS — D869 Sarcoidosis, unspecified: Secondary | ICD-10-CM | POA: Diagnosis not present

## 2023-03-05 LAB — ECHOCARDIOGRAM COMPLETE
AR max vel: 1.58 cm2
AV Area VTI: 2.13 cm2
AV Area mean vel: 1.64 cm2
AV Mean grad: 4.5 mm[Hg]
AV Peak grad: 8.2 mm[Hg]
Ao pk vel: 1.43 m/s
Area-P 1/2: 3.89 cm2
Calc EF: 46.9 %
Est EF: 50
MV M vel: 4.58 m/s
MV Peak grad: 83.7 mm[Hg]
MV VTI: 1.56 cm2
Radius: 0.55 cm
S' Lateral: 4.1 cm
Single Plane A2C EF: 46.5 %
Single Plane A4C EF: 49.8 %

## 2023-03-08 ENCOUNTER — Ambulatory Visit: Payer: Medicare Other | Admitting: Pulmonary Disease

## 2023-03-09 ENCOUNTER — Telehealth (HOSPITAL_COMMUNITY): Payer: Self-pay

## 2023-03-09 ENCOUNTER — Other Ambulatory Visit (HOSPITAL_COMMUNITY): Payer: Self-pay

## 2023-03-09 DIAGNOSIS — I5022 Chronic systolic (congestive) heart failure: Secondary | ICD-10-CM

## 2023-03-09 NOTE — Telephone Encounter (Addendum)
 Pt aware, agreeable, and verbalized understanding  TEE scheduled for 03/30/23, instructions given to patient over the phone and they were repeated back.  TEE instructions also mailed to patient.   ----- Message from Ezra Shuck sent at 03/05/2023  4:08 PM EST ----- EF is 50% but mitral regurgitation may be severe at this point, hard to tell from this study. I think he is going to need a TEE to see if the valve needs to be repaired.  Can arrange directly or can see in office to discuss.

## 2023-03-11 ENCOUNTER — Ambulatory Visit: Payer: Medicare Other | Admitting: Pulmonary Disease

## 2023-03-29 NOTE — Progress Notes (Signed)
 Spoke to patient and instructed them to come at 0630  and to be NPO after 0000.  Medications reviewed.    Confirmed that patient will have a ride home and someone to stay with them for 24 hours after the procedure.

## 2023-03-30 ENCOUNTER — Ambulatory Visit (HOSPITAL_COMMUNITY)
Admission: RE | Admit: 2023-03-30 | Discharge: 2023-03-30 | Disposition: A | Discharge status: Home / Self Care | Payer: Medicare Other | Source: Ambulatory Visit | Attending: Cardiology | Admitting: Cardiology

## 2023-03-30 ENCOUNTER — Ambulatory Visit (HOSPITAL_BASED_OUTPATIENT_CLINIC_OR_DEPARTMENT_OTHER): Payer: Medicare Other | Admitting: Anesthesiology

## 2023-03-30 ENCOUNTER — Encounter (HOSPITAL_COMMUNITY): Payer: Self-pay | Admitting: Cardiology

## 2023-03-30 ENCOUNTER — Other Ambulatory Visit: Payer: Self-pay

## 2023-03-30 ENCOUNTER — Other Ambulatory Visit (HOSPITAL_COMMUNITY): Payer: Self-pay

## 2023-03-30 ENCOUNTER — Encounter (HOSPITAL_COMMUNITY)
Admission: RE | Disposition: A | Discharge status: Home / Self Care | Payer: Self-pay | Source: Ambulatory Visit | Attending: Cardiology

## 2023-03-30 ENCOUNTER — Ambulatory Visit: Payer: Medicare Other | Admitting: Student

## 2023-03-30 ENCOUNTER — Ambulatory Visit (HOSPITAL_COMMUNITY): Payer: Self-pay | Admitting: Anesthesiology

## 2023-03-30 DIAGNOSIS — I340 Nonrheumatic mitral (valve) insufficiency: Secondary | ICD-10-CM

## 2023-03-30 DIAGNOSIS — I5022 Chronic systolic (congestive) heart failure: Secondary | ICD-10-CM

## 2023-03-30 HISTORY — PX: TRANSESOPHAGEAL ECHOCARDIOGRAM (CATH LAB): EP1270

## 2023-03-30 LAB — ECHO TEE
MV M vel: 6.29 m/s
MV Peak grad: 158.3 mm[Hg]
Radius: 1.1 cm
S' Lateral: 4.3 cm

## 2023-03-30 LAB — POCT I-STAT, CHEM 8
BUN: 17 mg/dL (ref 6–20)
Calcium, Ion: 1.25 mmol/L (ref 1.15–1.40)
Chloride: 105 mmol/L (ref 98–111)
Creatinine, Ser: 1.4 mg/dL — ABNORMAL HIGH (ref 0.61–1.24)
Glucose, Bld: 101 mg/dL — ABNORMAL HIGH (ref 70–99)
HCT: 44 % (ref 39.0–52.0)
Hemoglobin: 15 g/dL (ref 13.0–17.0)
Potassium: 4.1 mmol/L (ref 3.5–5.1)
Sodium: 141 mmol/L (ref 135–145)
TCO2: 24 mmol/L (ref 22–32)

## 2023-03-30 SURGERY — TRANSESOPHAGEAL ECHOCARDIOGRAM (TEE) (CATHLAB)
Anesthesia: Monitor Anesthesia Care

## 2023-03-30 MED ORDER — PROPOFOL 500 MG/50ML IV EMUL
INTRAVENOUS | Status: DC | PRN
  Administered 2023-03-30: 100 ug/kg/min via INTRAVENOUS

## 2023-03-30 MED ORDER — LIDOCAINE 2% (20 MG/ML) 5 ML SYRINGE
INTRAMUSCULAR | Status: DC | PRN
  Administered 2023-03-30: 100 mg via INTRAVENOUS

## 2023-03-30 MED ORDER — BUTAMBEN-TETRACAINE-BENZOCAINE 2-2-14 % EX AERO
INHALATION_SPRAY | CUTANEOUS | Status: DC | PRN
  Administered 2023-03-30: 1 via TOPICAL

## 2023-03-30 MED ORDER — SODIUM CHLORIDE 0.9 % IV SOLN
INTRAVENOUS | Status: DC
Start: 2023-03-30 — End: 2023-03-30

## 2023-03-30 MED ORDER — BUTAMBEN-TETRACAINE-BENZOCAINE 2-2-14 % EX AERO
INHALATION_SPRAY | CUTANEOUS | Status: AC
  Filled 2023-03-30: qty 20

## 2023-03-30 MED ORDER — PROPOFOL 10 MG/ML IV BOLUS
INTRAVENOUS | Status: DC | PRN
  Administered 2023-03-30: 100 mg via INTRAVENOUS
  Administered 2023-03-30: 50 mg via INTRAVENOUS

## 2023-03-30 NOTE — Anesthesia Postprocedure Evaluation (Signed)
Anesthesia Post Note  Patient: Richard Shepherd  Procedure(s) Performed: TRANSESOPHAGEAL ECHOCARDIOGRAM     Patient location during evaluation: Cath Lab Anesthesia Type: MAC Level of consciousness: awake and alert Pain management: pain level controlled Vital Signs Assessment: post-procedure vital signs reviewed and stable Respiratory status: spontaneous breathing, nonlabored ventilation, respiratory function stable and patient connected to nasal cannula oxygen Cardiovascular status: stable and blood pressure returned to baseline Postop Assessment: no apparent nausea or vomiting Anesthetic complications: no   No notable events documented.                  Laquanna Veazey

## 2023-03-30 NOTE — Transfer of Care (Signed)
Immediate Anesthesia Transfer of Care Note  Patient: Richard Shepherd  Procedure(s) Performed: TRANSESOPHAGEAL ECHOCARDIOGRAM  Patient Location: PACU and Cath Lab  Anesthesia Type:MAC  Level of Consciousness: awake, alert , and oriented  Airway & Oxygen Therapy: Patient Spontanous Breathing and Patient connected to face mask oxygen  Post-op Assessment: Report given to RN and Post -op Vital signs reviewed and stable  Post vital signs: Reviewed and stable  Last Vitals:  Vitals Value Taken Time  BP    Temp    Pulse    Resp    SpO2      Last Pain: There were no vitals filed for this visit.       Complications: No notable events documented.

## 2023-03-30 NOTE — CV Procedure (Addendum)
Procedure: TEE  Sedation: Per anesthesiology  Indication: Mitral regurgitation  Findings: Please see echo section for full report.  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  Will need evaluation of mitral valve by TCTS.   Richard Shepherd 03/30/2023 8:33 AM

## 2023-03-30 NOTE — H&P (Signed)
Advanced Heart Failure Team History and Physical Note   PCP:  [facility]  PCP-Cardiology: Orbie Pyo, MD     Reason for Admission: Mitral regurgitation, TEE   Richard Shepherd:    Echo was done with significant MR, not fully evaluated.  Here for TEE.       Home Medications Prior to Admission medications   Medication Sig Start Date End Date Taking? Authorizing Provider  acetaminophen (TYLENOL) 500 MG tablet Take 1,000 mg by mouth 4 (four) times daily as needed for mild pain.   Yes [provider]  adalimumab (HUMIRA, 2 PEN,) 40 MG/0.4ML pen Inject 40 mg into the skin every 14 (fourteen) days.   Yes [provider]  albuterol (PROVENTIL HFA;VENTOLIN HFA) 108 (90 Base) MCG/ACT inhaler Inhale 1-2 puffs into the lungs every 6 (six) hours as needed for wheezing or shortness of breath.   Yes [provider]  amiodarone (PACERONE) 200 MG tablet Take 1 tablet (200 mg total) by mouth daily. 10/12/22  Yes Laurey Morale, MD  Calcium Carbonate-Vit D-Min (CALCIUM 1200) 1200-1000 MG-UNIT CHEW Chew 1,200 mg by mouth every morning. 09/10/21 11/04/23 Yes Lanier Prude, MD  capsaicin (ZOSTRIX) 0.025 % cream Apply 1 application topically 2 (two) times daily as needed (skin care).    Yes [provider]  carboxymethylcellulose (REFRESH PLUS) 0.5 % SOLN Place 1 drop into both eyes 2 (two) times daily.   Yes [provider]  Cholecalciferol 50 MCG (2000 UT) TABS Take 2,000 Units by mouth daily. 04/08/20  Yes [provider]  cyclobenzaprine (FLEXERIL) 10 MG tablet Take 10 mg by mouth every 8 (eight) hours as needed for muscle spasms.   Yes [provider]  cycloSPORINE (RESTASIS) 0.05 % ophthalmic emulsion Place 1 drop into both eyes 2 (two) times daily. 12/18/20  Yes [provider]  diclofenac Sodium (VOLTAREN) 1 % GEL Apply 2 g topically 2 (two) times daily as needed (pain). 09/16/20  Yes [provider]   diphenhydrAMINE (BENADRYL) 50 MG capsule Take 50 mg by mouth at bedtime.   Yes [provider]  empagliflozin (JARDIANCE) 25 MG TABS tablet Take 12.5 mg by mouth daily. 01/03/21  Yes [provider]  furosemide (LASIX) 20 MG tablet Take 1 tablet (20 mg total) by mouth every other day. 01/28/23  Yes Laurey Morale, MD  gabapentin (NEURONTIN) 400 MG capsule Take 1,200 mg by mouth at bedtime. 04/08/20  Yes [provider]  metoprolol succinate (TOPROL XL) 50 MG 24 hr tablet Take 1.5 tablets (75 mg total) by mouth at bedtime. 01/28/23  Yes Laurey Morale, MD  nepafenac (ILEVRO) 0.3 % ophthalmic suspension INSTILL 1 DROP IN [facility] EYE DAILY 04/05/20  Yes [provider]  omeprazole (PRILOSEC) 20 MG capsule Take 1 capsule (20 mg total) by mouth daily. 09/10/21 11/04/23 Yes Lanier Prude, MD  prednisoLONE acetate (PRED FORTE) 1 % ophthalmic suspension Place 1 drop into both eyes 2 (two) times daily.   Yes [provider]  Rivaroxaban (XARELTO) 15 MG TABS tablet Take 15 mg by mouth daily with supper.   Yes [provider]  sacubitril-valsartan (ENTRESTO) 49-51 MG Take 1 tablet by mouth 2 (two) times daily. 01/28/23  Yes Laurey Morale, MD  spironolactone (ALDACTONE) 25 MG tablet Take 25 mg by mouth daily. 07/17/20  Yes [provider]  testosterone cypionate (DEPOTESTOSTERONE CYPIONATE) 200 MG/ML injection Inject 60 mg into the muscle every 14 (fourteen) days. 12/03/20  Yes  [provider]  timolol (TIMOPTIC-XR) 0.5 % ophthalmic gel-forming Place 1 drop into both eyes daily.   Yes [provider]  traMADol (ULTRAM) 50 MG tablet Take 50 mg by mouth 3 (three) times daily as needed for moderate pain. Maximum dose= 8 tablets per day For pain   Yes [provider]  urea 10 % lotion APPLY THIN LAYER TO AFFECTED AREA DAILY TO DRY SKIN /CALLUS REGIONS OF BOTH FEET 01/12/21  Yes [provider]  valACYclovir  (VALTREX) 1000 MG tablet Take 1,000 mg by mouth daily.   Yes [provider]  sildenafil (VIAGRA) 100 MG tablet Take 50 mg by mouth daily as needed for erectile dysfunction.    [provider]    Past Medical History: Past Medical History:  Diagnosis Date   Arthritis    "all over" (08/31/2017)   Asthma    Chest pain    Chronic lower back pain    CKD (chronic kidney disease), stage III (HCC)    Richard Shepherd 08/31/2017   DVT (deep venous thrombosis) (HCC) < & 06/2006   a. mainly affect R leg   Endocarditis 2018   Fibromyalgia    Heart murmur    OSA on CPAP    Pericarditis 2017   Positive cardiac stress test    Sarcoidosis    a. eye involvement only    Past Surgical History: Past Surgical History:  Procedure Laterality Date   CATARACT EXTRACTION W/ INTRAOCULAR LENS  IMPLANT, BILATERAL Bilateral 2012   ENDOVENOUS ABLATION SAPHENOUS VEIN W/ LASER Right 05/29/2020   endovenous laser ablation right greater saphenous vein and stab phlebectomy > 20 incisions by Fabienne Bruns MD    EYE SURGERY Bilateral 2015   scraped calcium from cornea   GLAUCOMA SURGERY Bilateral 2012    Family History:  Family History  Problem Relation Age of Onset   Breast cancer Mother    Multiple sclerosis Father    Multiple sclerosis Brother    Diabetes Brother     Social History: Social History   Socioeconomic History   Marital status: Married    Spouse name: Not on file   Number of children: Not on file   Years of education: Not on file   Highest education level: Not on file  Occupational History   Not on file  Tobacco Use   Smoking status: Former    Current packs/day: 0.12    Average packs/day: 0.1 packs/day for 4.0 years (0.5 ttl pk-yrs)    Types: Cigarettes   Smokeless tobacco: Never   Tobacco comments:    08/21/2017 "stopped in the 1990s"  Vaping Use   Vaping status: Never Used  Substance and Sexual Activity   Alcohol use: Yes    Comment: 08/31/2017 "might have a  margarita once/month; if that"   Drug use: Never   Sexual activity: Yes  Other Topics Concern   Not on file  Social History Narrative   Not on file   Social Drivers of Health   Financial Resource Strain: Not on file  Food Insecurity: Not on file  Transportation Needs: Not on file  Physical Activity: Not on file  Stress: Not on file  Social Connections: Not on file    Allergies:  Allergies  Allergen Reactions   Other Other (See Comments)    Dogs and Cats   Humira (2 Pen) [Adalimumab] Rash    Generic causes skin rash     Objective:    Vital Signs:   Temp:  [  98.6 F (37 C)] 98.6 F (37 C) (01/21 0655) Pulse Rate:  [71] 71 (01/21 0655) Resp:  [13] 13 (01/21 0655) BP: (135)/(84) 135/84 (01/21 0655) SpO2:  [98 %] 98 % (01/21 0655) Weight:  [113.4 kg] 113.4 kg (01/21 0655)   Filed Weights   03/30/23 0655  Weight: 113.4 kg     Physical Exam     General:  Well appearing. No respiratory difficulty HEENT: Normal Neck: Supple. no JVD. Carotids 2+ bilat; no bruits. No lymphadenopathy or thyromegaly appreciated. Cor: PMI nondisplaced. Regular rate & rhythm. 2/6 HSM apex Lungs: Clear Abdomen: Soft, nontender, nondistended. No hepatosplenomegaly. No bruits or masses. Good bowel sounds. Extremities: No cyanosis, clubbing, rash, edema Neuro: Alert & oriented x 3, cranial nerves grossly intact. moves all 4 extremities w/o difficulty. Affect pleasant.  Labs     Basic Metabolic Panel: Recent Labs  Lab 03/30/23 0710  NA 141  K 4.1  CL 105  GLUCOSE 101*  BUN 17  CREATININE 1.40*    Liver Function Tests: No results for input(s): "AST", "ALT", "ALKPHOS", "BILITOT", "PROT", "ALBUMIN" in the last 168 hours. No results for input(s): "LIPASE", "AMYLASE" in the last 168 hours. No results for input(s): "AMMONIA" in the last 168 hours.  CBC: Recent Labs  Lab 03/30/23 0710  HGB 15.0  HCT 44.0    Cardiac Enzymes: No results for input(s): "CKTOTAL", "CKMB",  "CKMBINDEX", "TROPONINI" in the last 168 hours.  BNP: BNP (last 3 results) Recent Labs    11/04/22 1611 01/28/23 1145  BNP 43.8 29.6    ProBNP (last 3 results) No results for input(s): "PROBNP" in the last 8760 hours.   CBG: No results for input(s): "GLUCAP" in the last 168 hours.  Coagulation Studies: No results for input(s): "LABPROT", "INR" in the last 72 hours.  Imaging: No results found.   Assessment/Plan   TEE today to investigate MR.    Marca Ancona, MD 03/30/2023, 7:52 AM  Advanced Heart Failure Team Pager [phone] (M-F; 7a - 5p)  Please contact [facility] for night-coverage after hours (4p -7a ) and weekends on [email or web address]

## 2023-03-30 NOTE — H&P (View-Only) (Signed)
 Advanced Heart Failure Team History and Physical Note   PCP:  [facility]  PCP-Cardiology: Orbie Pyo, MD     Reason for Admission: Mitral regurgitation, TEE   HPI:    Echo was done with significant MR, not fully evaluated.  Here for TEE.       Home Medications Prior to Admission medications   Medication Sig Start Date End Date Taking? Authorizing Provider  acetaminophen (TYLENOL) 500 MG tablet Take 1,000 mg by mouth 4 (four) times daily as needed for mild pain.   Yes [provider]  adalimumab (HUMIRA, 2 PEN,) 40 MG/0.4ML pen Inject 40 mg into the skin every 14 (fourteen) days.   Yes [provider]  albuterol (PROVENTIL HFA;VENTOLIN HFA) 108 (90 Base) MCG/ACT inhaler Inhale 1-2 puffs into the lungs every 6 (six) hours as needed for wheezing or shortness of breath.   Yes [provider]  amiodarone (PACERONE) 200 MG tablet Take 1 tablet (200 mg total) by mouth daily. 10/12/22  Yes Laurey Morale, MD  Calcium Carbonate-Vit D-Min (CALCIUM 1200) 1200-1000 MG-UNIT CHEW Chew 1,200 mg by mouth every morning. 09/10/21 11/04/23 Yes Lanier Prude, MD  capsaicin (ZOSTRIX) 0.025 % cream Apply 1 application topically 2 (two) times daily as needed (skin care).    Yes [provider]  carboxymethylcellulose (REFRESH PLUS) 0.5 % SOLN Place 1 drop into both eyes 2 (two) times daily.   Yes [provider]  Cholecalciferol 50 MCG (2000 UT) TABS Take 2,000 Units by mouth daily. 04/08/20  Yes [provider]  cyclobenzaprine (FLEXERIL) 10 MG tablet Take 10 mg by mouth every 8 (eight) hours as needed for muscle spasms.   Yes [provider]  cycloSPORINE (RESTASIS) 0.05 % ophthalmic emulsion Place 1 drop into both eyes 2 (two) times daily. 12/18/20  Yes [provider]  diclofenac Sodium (VOLTAREN) 1 % GEL Apply 2 g topically 2 (two) times daily as needed (pain). 09/16/20  Yes [provider]   diphenhydrAMINE (BENADRYL) 50 MG capsule Take 50 mg by mouth at bedtime.   Yes [provider]  empagliflozin (JARDIANCE) 25 MG TABS tablet Take 12.5 mg by mouth daily. 01/03/21  Yes [provider]  furosemide (LASIX) 20 MG tablet Take 1 tablet (20 mg total) by mouth every other day. 01/28/23  Yes Laurey Morale, MD  gabapentin (NEURONTIN) 400 MG capsule Take 1,200 mg by mouth at bedtime. 04/08/20  Yes [provider]  metoprolol succinate (TOPROL XL) 50 MG 24 hr tablet Take 1.5 tablets (75 mg total) by mouth at bedtime. 01/28/23  Yes Laurey Morale, MD  nepafenac (ILEVRO) 0.3 % ophthalmic suspension INSTILL 1 DROP IN [facility] EYE DAILY 04/05/20  Yes [provider]  omeprazole (PRILOSEC) 20 MG capsule Take 1 capsule (20 mg total) by mouth daily. 09/10/21 11/04/23 Yes Lanier Prude, MD  prednisoLONE acetate (PRED FORTE) 1 % ophthalmic suspension Place 1 drop into both eyes 2 (two) times daily.   Yes [provider]  Rivaroxaban (XARELTO) 15 MG TABS tablet Take 15 mg by mouth daily with supper.   Yes [provider]  sacubitril-valsartan (ENTRESTO) 49-51 MG Take 1 tablet by mouth 2 (two) times daily. 01/28/23  Yes Laurey Morale, MD  spironolactone (ALDACTONE) 25 MG tablet Take 25 mg by mouth daily. 07/17/20  Yes [provider]  testosterone cypionate (DEPOTESTOSTERONE CYPIONATE) 200 MG/ML injection Inject 60 mg into the muscle every 14 (fourteen) days. 12/03/20  Yes  [provider]  timolol (TIMOPTIC-XR) 0.5 % ophthalmic gel-forming Place 1 drop into both eyes daily.   Yes [provider]  traMADol (ULTRAM) 50 MG tablet Take 50 mg by mouth 3 (three) times daily as needed for moderate pain. Maximum dose= 8 tablets per day For pain   Yes [provider]  urea 10 % lotion APPLY THIN LAYER TO AFFECTED AREA DAILY TO DRY SKIN /CALLUS REGIONS OF BOTH FEET 01/12/21  Yes [provider]  valACYclovir  (VALTREX) 1000 MG tablet Take 1,000 mg by mouth daily.   Yes [provider]  sildenafil (VIAGRA) 100 MG tablet Take 50 mg by mouth daily as needed for erectile dysfunction.    [provider]    Past Medical History: Past Medical History:  Diagnosis Date   Arthritis    "all over" (08/31/2017)   Asthma    Chest pain    Chronic lower back pain    CKD (chronic kidney disease), stage III (HCC)    [name] 08/31/2017   DVT (deep venous thrombosis) (HCC) < & 06/2006   a. mainly affect R leg   Endocarditis 2018   Fibromyalgia    Heart murmur    OSA on CPAP    Pericarditis 2017   Positive cardiac stress test    Sarcoidosis    a. eye involvement only    Past Surgical History: Past Surgical History:  Procedure Laterality Date   CATARACT EXTRACTION W/ INTRAOCULAR LENS  IMPLANT, BILATERAL Bilateral 2012   ENDOVENOUS ABLATION SAPHENOUS VEIN W/ LASER Right 05/29/2020   endovenous laser ablation right greater saphenous vein and stab phlebectomy > 20 incisions by Fabienne Bruns MD    EYE SURGERY Bilateral 2015   scraped calcium from cornea   GLAUCOMA SURGERY Bilateral 2012    Family History:  Family History  Problem Relation Age of Onset   Breast cancer Mother    Multiple sclerosis Father    Multiple sclerosis Brother    Diabetes Brother     Social History: Social History   Socioeconomic History   Marital status: Married    Spouse name: Not on file   Number of children: Not on file   Years of education: Not on file   Highest education level: Not on file  Occupational History   Not on file  Tobacco Use   Smoking status: Former    Current packs/day: 0.12    Average packs/day: 0.1 packs/day for 4.0 years (0.5 ttl pk-yrs)    Types: Cigarettes   Smokeless tobacco: Never   Tobacco comments:    08/21/2017 "stopped in the 1990s"  Vaping Use   Vaping status: Never Used  Substance and Sexual Activity   Alcohol use: Yes    Comment: 08/31/2017 "might have a  margarita once/month; if that"   Drug use: Never   Sexual activity: Yes  Other Topics Concern   Not on file  Social History Narrative   Not on file   Social Drivers of Health   Financial Resource Strain: Not on file  Food Insecurity: Not on file  Transportation Needs: Not on file  Physical Activity: Not on file  Stress: Not on file  Social Connections: Not on file    Allergies:  Allergies  Allergen Reactions   Other Other (See Comments)    Dogs and Cats   Humira (2 Pen) [Adalimumab] Rash    Generic causes skin rash     Objective:    Vital Signs:   Temp:  [  98.6 F (37 C)] 98.6 F (37 C) (01/21 0655) Pulse Rate:  [71] 71 (01/21 0655) Resp:  [13] 13 (01/21 0655) BP: (135)/(84) 135/84 (01/21 0655) SpO2:  [98 %] 98 % (01/21 0655) Weight:  [113.4 kg] 113.4 kg (01/21 0655)   Filed Weights   03/30/23 0655  Weight: 113.4 kg     Physical Exam     General:  Well appearing. No respiratory difficulty HEENT: Normal Neck: Supple. no JVD. Carotids 2+ bilat; no bruits. No lymphadenopathy or thyromegaly appreciated. Cor: PMI nondisplaced. Regular rate & rhythm. 2/6 HSM apex Lungs: Clear Abdomen: Soft, nontender, nondistended. No hepatosplenomegaly. No bruits or masses. Good bowel sounds. Extremities: No cyanosis, clubbing, rash, edema Neuro: Alert & oriented x 3, cranial nerves grossly intact. moves all 4 extremities w/o difficulty. Affect pleasant.  Labs     Basic Metabolic Panel: Recent Labs  Lab 03/30/23 0710  NA 141  K 4.1  CL 105  GLUCOSE 101*  BUN 17  CREATININE 1.40*    Liver Function Tests: No results for input(s): "AST", "ALT", "ALKPHOS", "BILITOT", "PROT", "ALBUMIN" in the last 168 hours. No results for input(s): "LIPASE", "AMYLASE" in the last 168 hours. No results for input(s): "AMMONIA" in the last 168 hours.  CBC: Recent Labs  Lab 03/30/23 0710  HGB 15.0  HCT 44.0    Cardiac Enzymes: No results for input(s): "CKTOTAL", "CKMB",  "CKMBINDEX", "TROPONINI" in the last 168 hours.  BNP: BNP (last 3 results) Recent Labs    11/04/22 1611 01/28/23 1145  BNP 43.8 29.6    ProBNP (last 3 results) No results for input(s): "PROBNP" in the last 8760 hours.   CBG: No results for input(s): "GLUCAP" in the last 168 hours.  Coagulation Studies: No results for input(s): "LABPROT", "INR" in the last 72 hours.  Imaging: No results found.   Assessment/Plan   TEE today to investigate MR.    Marca Ancona, MD 03/30/2023, 7:52 AM  Advanced Heart Failure Team Pager [phone] (M-F; 7a - 5p)  Please contact [facility] for night-coverage after hours (4p -7a ) and weekends on [email or web address]

## 2023-03-30 NOTE — Anesthesia Preprocedure Evaluation (Signed)
Anesthesia Evaluation  Patient identified by MRN, date of birth, ID band Patient awake    Reviewed: Allergy & Precautions, NPO status , Patient's Chart, lab work & pertinent test results  History of Anesthesia Complications Negative for: history of anesthetic complications  Airway Mallampati: III  TM Distance: >3 FB Neck ROM: Full    Dental  (+) Teeth Intact, Dental Advisory Given   Pulmonary neg shortness of breath, asthma , sleep apnea and Continuous Positive Airway Pressure Ventilation , neg recent URI, former smoker   breath sounds clear to auscultation       Cardiovascular (-) angina (-) Past MI + Valvular Problems/Murmurs MR  Rhythm:Regular   1. Left ventricular ejection fraction, by estimation, is 50%. The left  ventricle has mildly decreased function. The left ventricle has no  regional wall motion abnormalities. There is moderate left ventricular  hypertrophy. Left ventricular diastolic  parameters are indeterminate. The average left ventricular global  longitudinal strain is -13.5 %. The global longitudinal strain is  abnormal.   2. Right ventricular systolic function is normal. The right ventricular  size is normal.   3. Left atrial size was mildly dilated.   4. Eccentric mitral regurgitation with prolapse and splay artifact.  Severity may be underestimated. The mitral valve is abnormal. Moderate  mitral valve regurgitation. No evidence of mitral stenosis.   5. The aortic valve was not well visualized. Aortic valve regurgitation  is not visualized.     Neuro/Psych neg Seizures  Neuromuscular disease  negative psych ROS   GI/Hepatic negative GI ROS, Neg liver ROS,,,  Endo/Other  negative endocrine ROS    Renal/GU Renal InsufficiencyRenal diseaseLab Results      Component                Value               Date                      NA                       141                 03/30/2023                K                         4.1                 03/30/2023                CO2                      26                  01/28/2023                GLUCOSE                  101 (H)             03/30/2023                BUN                      17  03/30/2023                CREATININE               1.40 (H)            03/30/2023                CALCIUM                  9.5                 01/28/2023                EGFR                     54 (L)              09/04/2021                GFRNONAA                 43 (L)              01/28/2023                Musculoskeletal  (+) Arthritis ,  Fibromyalgia -  Abdominal   Peds  Hematology Lab Results      Component                Value               Date                      WBC                      4.8                 11/04/2022                HGB                      15.0                03/30/2023                HCT                      44.0                03/30/2023                MCV                      96.6                11/04/2022                PLT                      250                 11/04/2022              Anesthesia Other Findings   Reproductive/Obstetrics                              Anesthesia Physical Anesthesia Plan  ASA: 2  Anesthesia  Plan: MAC   Post-op Pain Management: Minimal or no pain anticipated   Induction: Intravenous  PONV Risk Score and Plan: 1 and Propofol infusion and Treatment may vary due to age or medical condition  Airway Management Planned: Nasal Cannula, Natural Airway and Simple Face Mask  Additional Equipment: None  Intra-op Plan:   Post-operative Plan:   Informed Consent: I have reviewed the patients History and Physical, chart, labs and discussed the procedure including the risks, benefits and alternatives for the proposed anesthesia with the patient or authorized representative who has indicated his/her understanding and acceptance.     Dental advisory  given  Plan Discussed with: CRNA  Anesthesia Plan Comments:          Anesthesia Quick Evaluation

## 2023-03-31 ENCOUNTER — Telehealth (HOSPITAL_COMMUNITY): Payer: Self-pay

## 2023-03-31 ENCOUNTER — Other Ambulatory Visit (HOSPITAL_COMMUNITY): Payer: Self-pay

## 2023-03-31 ENCOUNTER — Encounter (HOSPITAL_COMMUNITY): Payer: Self-pay | Admitting: Cardiology

## 2023-03-31 DIAGNOSIS — I5022 Chronic systolic (congestive) heart failure: Secondary | ICD-10-CM

## 2023-03-31 DIAGNOSIS — I340 Nonrheumatic mitral (valve) insufficiency: Secondary | ICD-10-CM

## 2023-03-31 NOTE — Telephone Encounter (Signed)
Cath scheduled for 02.3.25 at 10.30 am. Patient called to inform him of time and date of cath, arrival time is 8.30 am  instructions given verbally also letter sent in mail and via my chart

## 2023-04-07 ENCOUNTER — Ambulatory Visit: Payer: Medicare Other | Admitting: Cardiology

## 2023-04-12 ENCOUNTER — Other Ambulatory Visit: Payer: Self-pay

## 2023-04-12 ENCOUNTER — Encounter (HOSPITAL_COMMUNITY)
Admission: RE | Disposition: A | Discharge status: Home / Self Care | Payer: Self-pay | Source: Ambulatory Visit | Attending: Cardiology

## 2023-04-12 ENCOUNTER — Ambulatory Visit (HOSPITAL_COMMUNITY)
Admission: RE | Admit: 2023-04-12 | Discharge: 2023-04-12 | Disposition: A | Discharge status: Home / Self Care | Payer: Medicare Other | Source: Ambulatory Visit | Attending: Cardiology | Admitting: Cardiology

## 2023-04-12 DIAGNOSIS — I340 Nonrheumatic mitral (valve) insufficiency: Secondary | ICD-10-CM | POA: Diagnosis present

## 2023-04-12 DIAGNOSIS — I5022 Chronic systolic (congestive) heart failure: Secondary | ICD-10-CM

## 2023-04-12 HISTORY — PX: RIGHT/LEFT HEART CATH AND CORONARY ANGIOGRAPHY: CATH118266

## 2023-04-12 LAB — POCT I-STAT EG7
Acid-Base Excess: 0 mmol/L (ref 0.0–2.0)
Acid-Base Excess: 0 mmol/L (ref 0.0–2.0)
Bicarbonate: 26.6 mmol/L (ref 20.0–28.0)
Bicarbonate: 26.9 mmol/L (ref 20.0–28.0)
Calcium, Ion: 1.23 mmol/L (ref 1.15–1.40)
Calcium, Ion: 1.25 mmol/L (ref 1.15–1.40)
HCT: 45 % (ref 39.0–52.0)
HCT: 46 % (ref 39.0–52.0)
Hemoglobin: 15.3 g/dL (ref 13.0–17.0)
Hemoglobin: 15.6 g/dL (ref 13.0–17.0)
O2 Saturation: 64 %
O2 Saturation: 64 %
Potassium: 4.5 mmol/L (ref 3.5–5.1)
Potassium: 4.5 mmol/L (ref 3.5–5.1)
Sodium: 141 mmol/L (ref 135–145)
Sodium: 141 mmol/L (ref 135–145)
TCO2: 28 mmol/L (ref 22–32)
TCO2: 28 mmol/L (ref 22–32)
pCO2, Ven: 48.9 mm[Hg] (ref 44–60)
pCO2, Ven: 49.3 mm[Hg] (ref 44–60)
pH, Ven: 7.343 (ref 7.25–7.43)
pH, Ven: 7.344 (ref 7.25–7.43)
pO2, Ven: 35 mm[Hg] (ref 32–45)
pO2, Ven: 36 mm[Hg] (ref 32–45)

## 2023-04-12 LAB — BASIC METABOLIC PANEL
Anion gap: 9 (ref 5–15)
BUN: 29 mg/dL — ABNORMAL HIGH (ref 6–20)
CO2: 22 mmol/L (ref 22–32)
Calcium: 8.8 mg/dL — ABNORMAL LOW (ref 8.9–10.3)
Chloride: 108 mmol/L (ref 98–111)
Creatinine, Ser: 1.47 mg/dL — ABNORMAL HIGH (ref 0.61–1.24)
GFR, Estimated: 55 mL/min — ABNORMAL LOW (ref >60)
Glucose, Bld: 98 mg/dL (ref 70–99)
Potassium: 4.5 mmol/L (ref 3.5–5.1)
Sodium: 139 mmol/L (ref 135–145)

## 2023-04-12 LAB — CBC
HCT: 45.6 % (ref 39.0–52.0)
Hemoglobin: 15.6 g/dL (ref 13.0–17.0)
MCH: 33.7 pg (ref 26.0–34.0)
MCHC: 34.2 g/dL (ref 30.0–36.0)
MCV: 98.5 fL (ref 80.0–100.0)
Platelets: 250 10*3/uL (ref 150–400)
RBC: 4.63 MIL/uL (ref 4.22–5.81)
RDW: 14.2 % (ref 11.5–15.5)
WBC: 6.1 10*3/uL (ref 4.0–10.5)
nRBC: 0 % (ref 0.0–0.2)

## 2023-04-12 SURGERY — RIGHT/LEFT HEART CATH AND CORONARY ANGIOGRAPHY
Anesthesia: LOCAL

## 2023-04-12 MED ORDER — IOHEXOL 350 MG/ML SOLN
INTRAVENOUS | Status: DC | PRN
  Administered 2023-04-12: 65 mL

## 2023-04-12 MED ORDER — MIDAZOLAM HCL 2 MG/2ML IJ SOLN
INTRAMUSCULAR | Status: DC | PRN
  Administered 2023-04-12: 1 mg via INTRAVENOUS
  Administered 2023-04-12: .5 mg via INTRAVENOUS

## 2023-04-12 MED ORDER — LIDOCAINE HCL (PF) 1 % IJ SOLN
INTRAMUSCULAR | Status: DC | PRN
  Administered 2023-04-12 (×2): 2 mL

## 2023-04-12 MED ORDER — HEPARIN SODIUM (PORCINE) 1000 UNIT/ML IJ SOLN
INTRAMUSCULAR | Status: AC
  Filled 2023-04-12: qty 10

## 2023-04-12 MED ORDER — ASPIRIN 81 MG PO CHEW: 81.0000 mg | CHEWABLE_TABLET | Freq: Once | ORAL | Status: DC

## 2023-04-12 MED ORDER — VERAPAMIL HCL 2.5 MG/ML IV SOLN
INTRAVENOUS | Status: DC | PRN
  Administered 2023-04-12: 10 mL via INTRA_ARTERIAL

## 2023-04-12 MED ORDER — VERAPAMIL HCL 2.5 MG/ML IV SOLN
INTRAVENOUS | Status: AC
  Filled 2023-04-12: qty 2

## 2023-04-12 MED ORDER — FENTANYL CITRATE (PF) 100 MCG/2ML IJ SOLN
INTRAMUSCULAR | Status: AC
  Filled 2023-04-12: qty 2

## 2023-04-12 MED ORDER — HEPARIN (PORCINE) IN NACL 1000-0.9 UT/500ML-% IV SOLN
INTRAVENOUS | Status: DC | PRN
  Administered 2023-04-12 (×2): 500 mL

## 2023-04-12 MED ORDER — HEPARIN SODIUM (PORCINE) 1000 UNIT/ML IJ SOLN
INTRAMUSCULAR | Status: DC | PRN
  Administered 2023-04-12: 5000 [IU] via INTRAVENOUS

## 2023-04-12 MED ORDER — LIDOCAINE HCL (PF) 1 % IJ SOLN
INTRAMUSCULAR | Status: AC
  Filled 2023-04-12: qty 30

## 2023-04-12 MED ORDER — SODIUM CHLORIDE 0.9 % IV SOLN: INTRAVENOUS | Status: DC

## 2023-04-12 MED ORDER — MIDAZOLAM HCL 2 MG/2ML IJ SOLN
INTRAMUSCULAR | Status: AC
  Filled 2023-04-12: qty 2

## 2023-04-12 MED ORDER — FENTANYL CITRATE (PF) 100 MCG/2ML IJ SOLN
INTRAMUSCULAR | Status: DC | PRN
  Administered 2023-04-12 (×2): 25 ug via INTRAVENOUS

## 2023-04-12 SURGICAL SUPPLY — 12 items
CATH 5FR JL3.5 JR4 ANG PIG MP (CATHETERS) IMPLANT
CATH BALLN WEDGE 5F 110CM (CATHETERS) IMPLANT
CATH INFINITI 5 FR JL3.5 (CATHETERS) IMPLANT
DEVICE RAD COMP TR BAND LRG (VASCULAR PRODUCTS) IMPLANT
GLIDESHEATH SLEND SS 6F .021 (SHEATH) IMPLANT
GUIDEWIRE INQWIRE 1.5J.035X260 (WIRE) IMPLANT
INQWIRE 1.5J .035X260CM (WIRE) ×1
KIT HEART LEFT (KITS) IMPLANT
PACK CARDIAC CATHETERIZATION (CUSTOM PROCEDURE TRAY) ×1 IMPLANT
SHEATH GLIDE SLENDER 4/5FR (SHEATH) IMPLANT
SHEATH PROBE COVER 6X72 (BAG) IMPLANT
TUBING CIL FLEX 10 FLL-RA (TUBING) IMPLANT

## 2023-04-12 NOTE — Progress Notes (Signed)
Patient's TR band removed at 1335, gauze dressing applied. Right radial level 0, clean, dry, and intact. Patient walked to the bathroom without difficulties.

## 2023-04-12 NOTE — Interval H&P Note (Signed)
History and Physical Interval Note:  04/12/2023 10:56 AM  Richard Shepherd  has presented today for surgery, with the diagnosis of pre mitral valve workup.  The various methods of treatment have been discussed with the patient and family. After consideration of risks, benefits and other options for treatment, the patient has consented to  Procedure(s): RIGHT/LEFT HEART CATH AND CORONARY ANGIOGRAPHY (N/A) as a surgical intervention.  The patient's history has been reviewed, patient examined, no change in status, stable for surgery.  I have reviewed the patient's chart and labs.  Questions were answered to the patient's satisfaction.     Richard Shepherd [employer]

## 2023-04-12 NOTE — Discharge Instructions (Signed)

## 2023-04-13 ENCOUNTER — Encounter (HOSPITAL_COMMUNITY): Payer: Self-pay | Admitting: Cardiology

## 2023-04-15 ENCOUNTER — Encounter: Payer: No Typology Code available for payment source | Admitting: Thoracic Surgery (Cardiothoracic Vascular Surgery)

## 2023-04-22 ENCOUNTER — Encounter: Payer: No Typology Code available for payment source | Admitting: Thoracic Surgery (Cardiothoracic Vascular Surgery)

## 2023-04-28 NOTE — Progress Notes (Incomplete)
PCP: [facility] Cardiology: Dr. Lynnette Caffey EP: Dr. Lalla Brothers HF Cardiology: Dr. Shirlee Latch  58 y.o. 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  Sarcoidosis was diagnosed in 1990s by lung biopsy.  He has no family history of sarcoidosis, it was thought to possibly be triggered by exposures during the [place] War (patient served in the army).  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 Back in 10/20, cardiac MRI was concerning for CS => subendocardial diffuse basal LGE not in a coronary distribution.  Echo in 10/21 showed EF 40-45%.  Most recent echo at [facility] was a difficult study, EF reported as 50-55% but likely worse.  Echo in 6/23 at [facility] showed EF 25-30%. Patient had a Zio monitor in 5/23 showing 19% PVCs.  He was seen by Dr. Lynnette Caffey who was concerned for active cardiac sarcoidosis.  Coronary CTA was done in 7/23 showed no significant CAD. Patient was started on po steroids by Dr. Lynnette Caffey and was referred to Dr. Lalla Brothers for EP evaluation.  Patient has been on adalimumab through his rheumatologist at the [facility] for 2 years. Prior to this, he was on mycophenolate.  He says that he has not taken methotrexate.   Cardiac PET in 8/23 showed EF 43%, no abnormal metabolism to suggest active sarcoidosis.   Zio 2 day (10/23) Mostly NSR, 1.5 % PVC burden.  cMRI (2/24) showed LVEF 50% with severe dilation, RVEF  51%, mitral valve with evidence of prior endocarditis with mild MR, basal LGE (resolution of prior apical LGE).  Repeat cardiac PET in 11/24 showed EF 38%, no active inflammation.   Today he returns for cardiac sarcoidosis follow up. He continues on  Humira through his rheumatologist (at the [facility]). Weight has been up recently, he says that he was on a trip to [place] recently and ate out a lot. He started taking Lasix 20 mg daily about a week ago.  No dyspnea walking up a flight of stairs.  He walks about 1 mile/day on the treadmill with no problems.  Some general fatigue. No chest pain.  No lightheadedness or palpitations.  No orthopnea/PND.    ECG (personally reviewed): NSR, 1st degree AVB, IVCD 122 msec  Labs (6/23): K 4.7, creatinine 1.5 Labs (7/23): hgb 18.7 Labs (3/24): hgb 15.8, TSH mildly elevated, normal T4 Labs (8/24): BNP 44, K 4.7, creatinine 1.55  PMH: 1. HTN 2. CKD stage 3 3. DVT: Spontaneous, 2007 and again in 2008.  4. OSA 5. 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 H/o mitral valve endocarditis: 2018, Strep bacteremia.  7. Erectile dysfunction 8. Sarcoidosis: Ocular, pulmonary, cardiac, and hepatic.  Diagnosed in 1990s via lung biopsy.  9. NSVT/PVCs: Zio monitor (5/23) with 19% PVCs and 1 run NSVT.  - Zio monitor (8/23): 13.7% PVCs - Zio 2 day (10/23): Mostly NSR, 1.5 % PVC burden. 10. Chronic systolic CHF: Thought to be due to cardiac sarcoidosis.  - Coronary CTA (7/23): Calcium score 0, normal coronaries.  - Echo (10/21): EF 40-45% - cMRI (10/20): EF 50%, diffuse basal segment subendocardial LGE, apical inferoseptal LGE => not in coronary distribution, concern for cardiac sarcoidosis.  - Echo (6/23, [facility]): EF 50-55%, mild LVH, RV  normal (difficult images, EF probably worse than reported).  - Echo (6/23, [facility] hospital): EF 25-30%, akinesis basal anteroseptal wall.  - Cardiac PET (8/23): EF 43%, no abnormal metabolism to suggest active sarcoidosis. - cMRI (2/24):  LVEF 50% with severe dilation, RVEF  51%, mitral valve with evidence of prior endocarditis with mild MR, basal LGE (resolution of prior apical LGE). - Cardiac PET (11/24): EF 38%, no active inflammation. ,  Social History   Socioeconomic History    Marital status: Married    Spouse name: Not on file   Number of children: Not on file   Years of education: Not on file   Highest education level: Not on file  Occupational History   Not on file  Tobacco Use   Smoking status: Former    Current packs/day: 0.12    Average packs/day: 0.1 packs/day for 4.0 years (0.5 ttl pk-yrs)    Types: Cigarettes   Smokeless tobacco: Never   Tobacco comments:    08/21/2017 "stopped in the 1990s"  Vaping Use   Vaping status: Never Used  Substance and Sexual Activity   Alcohol use: Yes    Comment: 08/31/2017 "might have a margarita once/month; if that"   Drug use: Never   Sexual activity: Yes  Other Topics Concern   Not on file  Social History Narrative   Not on file   Social Drivers of Health   Financial Resource Strain: Not on file  Food Insecurity: Not on file  Transportation Needs: Not on file  Physical Activity: Not on file  Stress: Not on file  Social Connections: Not on file  Intimate Partner Violence: Not on file   Family History  Problem Relation Age of Onset   Breast cancer Mother    Multiple sclerosis Father    Multiple sclerosis Brother    Diabetes Brother    ROS: All systems reviewed and negative except as per HPI.   Current Outpatient Medications  Medication Sig Dispense Refill   acetaminophen (TYLENOL) 500 MG tablet Take 1,000 mg by mouth 4 (four) times daily as needed for mild pain.     adalimumab (HUMIRA, 2 PEN,) 40 MG/0.4ML pen Inject 40 mg into the skin every 14 (fourteen) days.     albuterol (PROVENTIL HFA;VENTOLIN HFA) 108 (90 Base) MCG/ACT inhaler Inhale 1-2 puffs into the lungs every 6 (six) hours as needed for wheezing or shortness of breath.     amiodarone (PACERONE) 200 MG tablet Take 1 tablet (200 mg total) by mouth daily. 90 tablet 3   ascorbic acid (VITAMIN C) 500 MG tablet Take 500 mg by mouth daily.     Calcium Carbonate-Vit D-Min (CALCIUM 1200) 1200-1000 MG-UNIT CHEW Chew 1,200 mg by mouth every morning.  30 tablet 5   capsaicin (ZOSTRIX) 0.025 % cream Apply 1 application topically 2 (two) times daily as needed (skin care).      carboxymethylcellulose (REFRESH PLUS) 0.5 % SOLN Place 1 drop into both eyes 2 (two) times daily.     Cholecalciferol (VITAMIN D3) 50 MCG (2000 UT) capsule Take 2,000 Units by mouth daily.     cyclobenzaprine (FLEXERIL) 10 MG tablet Take 10 mg by mouth every 8 (eight) hours as needed for muscle spasms.     cycloSPORINE (RESTASIS) 0.05 % ophthalmic emulsion Place 1 drop into both eyes 2 (two) times daily.     diclofenac Sodium (VOLTAREN) 1 % GEL Apply 2 g topically 2 (two) times daily as needed (pain).     diphenhydrAMINE (BENADRYL) 50  MG capsule Take 50 mg by mouth at bedtime.     empagliflozin (JARDIANCE) 25 MG TABS tablet Take 12.5 mg by mouth daily.     furosemide (LASIX) 20 MG tablet Take 1 tablet (20 mg total) by mouth every other day. 45 tablet 3   gabapentin (NEURONTIN) 400 MG capsule Take 400-800 mg by mouth See admin instructions. 400 mg during the day, 800 mg at bedtime     metoprolol succinate (TOPROL XL) 50 MG 24 hr tablet Take 1.5 tablets (75 mg total) by mouth at bedtime. 90 tablet 3   Multiple Vitamins-Minerals (MULTIVITAMIN GUMMIES ADULT PO) Take 2 each by mouth daily.     nepafenac (ILEVRO) 0.3 % ophthalmic suspension 1 drop daily.     omeprazole (PRILOSEC) 20 MG capsule Take 1 capsule (20 mg total) by mouth daily. 175 capsule 0   prednisoLONE acetate (PRED FORTE) 1 % ophthalmic suspension Place 1 drop into both eyes 2 (two) times daily.     Rivaroxaban (XARELTO) 15 MG TABS tablet Take 15 mg by mouth daily with supper.     sacubitril-valsartan (ENTRESTO) 49-51 MG Take 1 tablet by mouth 2 (two) times daily. 60 tablet 11   sildenafil (VIAGRA) 100 MG tablet Take 50 mg by mouth daily as needed for erectile dysfunction.     spironolactone (ALDACTONE) 25 MG tablet Take 25 mg by mouth daily.     testosterone cypionate (DEPOTESTOSTERONE CYPIONATE) 200 MG/ML  injection Inject 60 mg into the muscle every 14 (fourteen) days.     timolol (TIMOPTIC-XR) 0.5 % ophthalmic gel-forming Place 1 drop into both eyes daily.     traMADol (ULTRAM) 50 MG tablet Take 50 mg by mouth 3 (three) times daily as needed for moderate pain. Maximum dose= 8 tablets per day For pain     urea 10 % lotion Apply 1 Application topically daily as needed for dry skin.     valACYclovir (VALTREX) 1000 MG tablet Take 1,000 mg by mouth daily.     No current facility-administered medications for this visit.   Wt Readings from Last 3 Encounters:  04/12/23 111.6 kg (246 lb)  03/30/23 113.4 kg (250 lb)  01/29/23 113.9 kg (251 lb)   There were no vitals taken for this visit. General: NAD Neck: Thick, JVP difficult, no thyromegaly or thyroid nodule.  Lungs: Clear to auscultation bilaterally with normal respiratory effort. CV: Nondisplaced PMI.  Heart regular S1/S2, no S3/S4, no murmur.  No peripheral edema.  No carotid bruit.  Normal pedal pulses.  Abdomen: Soft, nontender, no hepatosplenomegaly, no distention.  Skin: Intact without lesions or rashes.  Neurologic: Alert and oriented x 3.  Psych: Normal affect. Extremities: No clubbing or cyanosis.  HEENT: Normal.   Assessment/Plan: 1. Chronic systolic CHF: Nonischemic cardiomyopathy.  Coronary CTA (7/23) with no significant CAD. Strong suspicion for cardiac sarcoidosis.  Patient has tissue diagnosis of sarcoidosis from lung biopsy. He has h/o frequent PVCs/NSVT as well as decreased LV systolic function and LGE on cMRI. Cardiac MRI in 10/20 with EF 50%, diffuse basal segment subendocardial LGE, apical inferoseptal LGE => not in coronary distribution, concern for cardiac sarcoidosis. Echo from [facility] in 6/23 with EF 25-30%.  Echo at [facility] in 6/23 was a technically difficult study. Cardiac PET in 8/23 showed EF 43%, no abnormal metabolism to suggest active sarcoidosis. cMRI (2/24) showed LVEF improved 50%.  Cardiac PET  repeated in 11/24 showed EF 38%, no active inflammation.  He is not volume overloaded on exam.  NYHA class I-II. - He can continue Lasix 20 mg daily for now, but will get BMET today to check creatinine.  - Continue Entresto 49/51 bid.  - Continue spironolactone 25 mg daily.  - Continue empagliflozin 12.5 mg daily.  - Increase Toprol XL to 75 mg at bedtime. - He has seen EP. With minimal LGE on last cMRI, felt ICD not indicated. - I will arrange for repeat echo.  2. Sarcoidosis: Patient has biopsy-diagnosed sarcoidosis with involvement of lungs, hilar lymphadenopathy, liver, and eyes. Strongly suspect cardiac sarcoidosis with concerning LGE pattern on 2020 MRI and PVCs/NSVT.  Diagnosis was made in the 1990s. He has been on mycophenolate in the past and is now on adalimumab (x 2 years).  He was started on prednisone due to frequent PVCs and NSVT on monitoring for more aggressive treatment of cardiac sarcoidosis.  He is now off prednisone.  Cardiac PET at [facility] in 8/23 showed EF 43%, no abnormal metabolism to suggest active sarcoidosis, repeat cardiac PET in 11/24 again showed EF 38% with no active inflammation suggestive of active sarcoidosis. - Continue rheumatology followup for Humira.  - He is now followed by Dr. Francine Graven for pulmonary component of sarcoidosis. 3. DVT: Spontaneous, x 2.   - Continue Xarelto.  4. 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  He has been started on amiodarone.  - Continue amiodarone 200 mg daily.  Check LFTs and TSH. He will need regular eye exam.  Follow up in 3 months with APP  [facility]  04/28/2023

## 2023-04-29 ENCOUNTER — Encounter: Payer: No Typology Code available for payment source | Admitting: Thoracic Surgery (Cardiothoracic Vascular Surgery)

## 2023-04-30 ENCOUNTER — Encounter (HOSPITAL_COMMUNITY): Payer: No Typology Code available for payment source

## 2023-05-05 NOTE — Progress Notes (Unsigned)
 [address]       [place] [address]             [phone]           Kemar Pandit [facility] Medical Record #[id number] Date of Birth: [birth year]  Laurey Morale, MD [facility], [facility]  Chief Complaint: mitral regurgitation     History of Present Illness:     Pt is a very pleasant 58 yo male who has a significant history of sarcoidosis and cardiomyopathy with ventricular arrhythmias, pulm embolism, strept endocarditis and CKD who has been found to have progressive MR. Pt reports that he recently just recovered from pneumonia and feels better and actually went to the gym yesterday and did well on treadmill. Pt however has been followed closely by Dr Shirlee Latch and recent echo with worsening MR led to TEE which reveals moderate to severe MR with blunting of pulm vein flow, bileaflet prolapse and possible anterior leaflet perforation ( I dont actually feel that) with depressed LV function to EF of 45%. Pt had cath without PHTN nor CAD.      Past Medical History:  Diagnosis Date   Arthritis    "all over" (08/31/2017)   Asthma    Chest pain    Chronic lower back pain    CKD (chronic kidney disease), stage III (HCC)    [name] 08/31/2017   DVT (deep venous thrombosis) (HCC) < & 06/2006   a. mainly affect R leg   Endocarditis 2018   Fibromyalgia    Heart murmur    OSA on CPAP    Pericarditis 2017   Positive cardiac stress test    Sarcoidosis    a. eye involvement only    Past Surgical History:  Procedure Laterality Date   CATARACT EXTRACTION W/ INTRAOCULAR LENS  IMPLANT, BILATERAL Bilateral 2012   ENDOVENOUS ABLATION SAPHENOUS VEIN W/ LASER Right 05/29/2020   endovenous laser ablation right greater saphenous vein and stab phlebectomy > 20 incisions by Fabienne Bruns MD    EYE SURGERY Bilateral 2015   scraped calcium from cornea   GLAUCOMA SURGERY Bilateral 2012   RIGHT/LEFT HEART CATH AND CORONARY ANGIOGRAPHY N/A 04/12/2023   Procedure: RIGHT/LEFT  HEART CATH AND CORONARY ANGIOGRAPHY;  Surgeon: Laurey Morale, MD;  Location: [facility] INVASIVE CV LAB;  Service: Cardiovascular;  Laterality: N/A;   TRANSESOPHAGEAL ECHOCARDIOGRAM (CATH LAB) N/A 03/30/2023   Procedure: TRANSESOPHAGEAL ECHOCARDIOGRAM;  Surgeon: Laurey Morale, MD;  Location: [facility] INVASIVE CV LAB;  Service: Cardiovascular;  Laterality: N/A;    Social History   Tobacco Use  Smoking Status Former   Current packs/day: 0.12   Average packs/day: 0.1 packs/day for 4.0 years (0.5 ttl pk-yrs)   Types: Cigarettes  Smokeless Tobacco Never  Tobacco Comments   08/21/2017 "stopped in the 1990s"    Social History   Substance and Sexual Activity  Alcohol Use Yes   Comment: 08/31/2017 "might have a margarita once/month; if that"    Social History   Socioeconomic History   Marital status: Married    Spouse name: Not on file   Number of children: Not on file   Years of education: Not on file   Highest education level: Not on file  Occupational History   Not on file  Tobacco Use   Smoking status: Former    Current packs/day: 0.12    Average packs/day: 0.1 packs/day for 4.0 years (0.5 ttl pk-yrs)    Types: Cigarettes   Smokeless  tobacco: Never   Tobacco comments:    08/21/2017 "stopped in the 1990s"  Vaping Use   Vaping status: Never Used  Substance and Sexual Activity   Alcohol use: Yes    Comment: 08/31/2017 "might have a margarita once/month; if that"   Drug use: Never   Sexual activity: Yes  Other Topics Concern   Not on file  Social History Narrative   Not on file   Social Drivers of Health   Financial Resource Strain: Not on file  Food Insecurity: Not on file  Transportation Needs: Not on file  Physical Activity: Not on file  Stress: Not on file  Social Connections: Not on file  Intimate Partner Violence: Not on file    Allergies  Allergen Reactions   Other Other (See Comments)    Dogs and Cats   Humira (2 Pen) [Adalimumab] Rash    Generic causes skin  rash     Current Outpatient Medications  Medication Sig Dispense Refill   acetaminophen (TYLENOL) 500 MG tablet Take 1,000 mg by mouth 4 (four) times daily as needed for mild pain.     adalimumab (HUMIRA, 2 PEN,) 40 MG/0.4ML pen Inject 40 mg into the skin every 14 (fourteen) days.     albuterol (PROVENTIL HFA;VENTOLIN HFA) 108 (90 Base) MCG/ACT inhaler Inhale 1-2 puffs into the lungs every 6 (six) hours as needed for wheezing or shortness of breath.     amiodarone (PACERONE) 200 MG tablet Take 1 tablet (200 mg total) by mouth daily. 90 tablet 3   ascorbic acid (VITAMIN C) 500 MG tablet Take 500 mg by mouth daily.     Calcium Carbonate-Vit D-Min (CALCIUM 1200) 1200-1000 MG-UNIT CHEW Chew 1,200 mg by mouth every morning. 30 tablet 5   capsaicin (ZOSTRIX) 0.025 % cream Apply 1 application topically 2 (two) times daily as needed (skin care).      carboxymethylcellulose (REFRESH PLUS) 0.5 % SOLN Place 1 drop into both eyes 2 (two) times daily.     Cholecalciferol (VITAMIN D3) 50 MCG (2000 UT) capsule Take 2,000 Units by mouth daily.     cyclobenzaprine (FLEXERIL) 10 MG tablet Take 10 mg by mouth every 8 (eight) hours as needed for muscle spasms.     cycloSPORINE (RESTASIS) 0.05 % ophthalmic emulsion Place 1 drop into both eyes 2 (two) times daily.     diclofenac Sodium (VOLTAREN) 1 % GEL Apply 2 g topically 2 (two) times daily as needed (pain).     diphenhydrAMINE (BENADRYL) 50 MG capsule Take 50 mg by mouth at bedtime.     empagliflozin (JARDIANCE) 25 MG TABS tablet Take 12.5 mg by mouth daily.     furosemide (LASIX) 20 MG tablet Take 1 tablet (20 mg total) by mouth every other day. 45 tablet 3   gabapentin (NEURONTIN) 400 MG capsule Take 400-800 mg by mouth See admin instructions. 400 mg during the day, 800 mg at bedtime     metoprolol succinate (TOPROL XL) 50 MG 24 hr tablet Take 1.5 tablets (75 mg total) by mouth at bedtime. 90 tablet 3   Multiple Vitamins-Minerals (MULTIVITAMIN GUMMIES ADULT  PO) Take 2 each by mouth daily.     nepafenac (ILEVRO) 0.3 % ophthalmic suspension 1 drop daily.     omeprazole (PRILOSEC) 20 MG capsule Take 1 capsule (20 mg total) by mouth daily. 175 capsule 0   prednisoLONE acetate (PRED FORTE) 1 % ophthalmic suspension Place 1 drop into both eyes 2 (two) times daily.  Rivaroxaban (XARELTO) 15 MG TABS tablet Take 15 mg by mouth daily with supper.     sacubitril-valsartan (ENTRESTO) 49-51 MG Take 1 tablet by mouth 2 (two) times daily. 60 tablet 11   sildenafil (VIAGRA) 100 MG tablet Take 50 mg by mouth daily as needed for erectile dysfunction.     spironolactone (ALDACTONE) 25 MG tablet Take 25 mg by mouth daily.     testosterone cypionate (DEPOTESTOSTERONE CYPIONATE) 200 MG/ML injection Inject 60 mg into the muscle every 14 (fourteen) days.     timolol (TIMOPTIC-XR) 0.5 % ophthalmic gel-forming Place 1 drop into both eyes daily.     traMADol (ULTRAM) 50 MG tablet Take 50 mg by mouth 3 (three) times daily as needed for moderate pain. Maximum dose= 8 tablets per day For pain     urea 10 % lotion Apply 1 Application topically daily as needed for dry skin.     valACYclovir (VALTREX) 1000 MG tablet Take 1,000 mg by mouth daily.     No current facility-administered medications for this visit.     Family History  Problem Relation Age of Onset   Breast cancer Mother    Multiple sclerosis Father    Multiple sclerosis Brother    Diabetes Brother        Physical Exam: Healthy appearing Lungs: clear Card: rr with 2/6 sem at axillae Ext: warm no edema Neuro: intact     Diagnostic Studies & Laboratory data: I have personally reviewed the following studies and agree with the findings   TTE (02/2023) IMPRESSIONS     1. Left ventricular ejection fraction, by estimation, is 50%. The left  ventricle has mildly decreased function. The left ventricle has no  regional wall motion abnormalities. There is moderate left ventricular  hypertrophy. Left  ventricular diastolic  parameters are indeterminate. The average left ventricular global  longitudinal strain is -13.5 %. The global longitudinal strain is  abnormal.   2. Right ventricular systolic function is normal. The right ventricular  size is normal.   3. Left atrial size was mildly dilated.   4. Eccentric mitral regurgitation with prolapse and splay artifact.  Severity may be underestimated. The mitral valve is abnormal. Moderate  mitral valve regurgitation. No evidence of mitral stenosis.   5. The aortic valve was not well visualized. Aortic valve regurgitation  is not visualized.   Comparison(s): Prior images reviewed side by side. MR is worse that prior.   FINDINGS   Left Ventricle: Left ventricular ejection fraction, by estimation, is  50%. The left ventricle has mildly decreased function. The left ventricle  has no regional wall motion abnormalities. The average left ventricular  global longitudinal strain is -13.5  %. The global longitudinal strain is abnormal. The left ventricular  internal cavity size was normal in size. There is moderate left  ventricular hypertrophy. Left ventricular diastolic parameters are  indeterminate.   Right Ventricle: The right ventricular size is normal. No increase in  right ventricular wall thickness. Right ventricular systolic function is  normal.   Left Atrium: Left atrial size was mildly dilated.   Right Atrium: Right atrial size was normal in size.   Pericardium: There is no evidence of pericardial effusion.   Mitral Valve: Eccentric mitral regurgitation with prolapse and splay  artifact. Severity may be underestimated. The mitral valve is abnormal.  Moderate mitral valve regurgitation. No evidence of mitral valve stenosis.  MV peak gradient, 4.7 mmHg. The mean  mitral valve gradient is 3.0 mmHg.   Tricuspid  Valve: The tricuspid valve is normal in structure. Tricuspid  valve regurgitation is not demonstrated. No evidence of  tricuspid  stenosis.   Aortic Valve: The aortic valve was not well visualized. Aortic valve  regurgitation is not visualized. Aortic valve mean gradient measures 4.5  mmHg. Aortic valve peak gradient measures 8.2 mmHg. Aortic valve area, by  VTI measures 2.13 cm.   Pulmonic Valve: The pulmonic valve was normal in structure. Pulmonic valve  regurgitation is not visualized.   Aorta: The aortic root and ascending aorta are structurally normal, with  no evidence of dilitation.   IAS/Shunts: The atrial septum is grossly normal.     LEFT VENTRICLE  PLAX 2D  LVIDd:         5.80 cm      Diastology  LVIDs:         4.10 cm      LV e' medial:    10.70 cm/s  LV PW:         1.40 cm      LV E/e' medial:  9.8  LV IVS:        1.40 cm      LV e' lateral:   11.40 cm/s  LVOT diam:     2.30 cm      LV E/e' lateral: 9.2  LV SV:         51  LV SV Index:   22           2D Longitudinal Strain  LVOT Area:     4.15 cm     2D Strain GLS Avg:     -13.5 %    LV Volumes (MOD)  LV vol d, MOD A2C: 175.0 ml  LV vol d, MOD A4C: 182.0 ml  LV vol s, MOD A2C: 93.7 ml  LV vol s, MOD A4C: 91.3 ml  LV SV MOD A2C:     81.3 ml  LV SV MOD A4C:     182.0 ml  LV SV MOD BP:      83.7 ml   RIGHT VENTRICLE             IVC  RV Basal diam:  3.10 cm     IVC diam: 0.40 cm  RV S prime:     15.70 cm/s  TAPSE (M-mode): 2.4 cm   LEFT ATRIUM           Index        RIGHT ATRIUM           Index  LA diam:      4.50 cm 1.94 cm/m   RA Area:     15.00 cm  LA Vol (A4C): 91.9 ml 39.57 ml/m  RA Volume:   33.50 ml  14.43 ml/m   AORTIC VALVE                    PULMONIC VALVE  AV Area (Vmax):    1.58 cm     PV Vmax:       0.75 m/s  AV Area (Vmean):   1.64 cm     PV Peak grad:  2.3 mmHg  AV Area (VTI):     2.13 cm  AV Vmax:           143.00 cm/s  AV Vmean:          93.650 cm/s  AV VTI:            0.240 m  AV Peak Grad:  8.2 mmHg  AV Mean Grad:      4.5 mmHg  LVOT Vmax:         54.50 cm/s  LVOT Vmean:        36.900  cm/s  LVOT VTI:          0.123 m  LVOT/AV VTI ratio: 0.51    AORTA  Ao Root diam: 3.20 cm  Ao Asc diam:  2.90 cm   MITRAL VALVE  MV Area (PHT): 3.89 cm       SHUNTS  MV Area VTI:   1.56 cm       Systemic VTI:  0.12 m  MV Peak grad:  4.7 mmHg       Systemic Diam: 2.30 cm  MV Mean grad:  3.0 mmHg  MV Vmax:       1.08 m/s  MV Vmean:      81.4 cm/s  MV Decel Time: 195 msec  MR Peak grad:    83.7 mmHg  MR Mean grad:    54.5 mmHg  MR Vmax:         457.50 cm/s  MR Vmean:        348.5 cm/s  MR PISA:         1.90 cm  MR PISA Eff ROA: 16 mm  MR PISA Radius:  0.55 cm  MV E velocity: 105.00 cm/s  MV A velocity: 66.50 cm/s  MV E/A ratio:  1.58   TEE (03/2023) IMPRESSIONS     1. Left ventricular ejection fraction, by estimation, is 45 to 50%. The  left ventricle has mildly decreased function. The left ventricle  demonstrates global hypokinesis. The left ventricular internal cavity size  was mildly dilated.   2. Right ventricular systolic function is normal. The right ventricular  size is normal. Tricuspid regurgitation signal is inadequate for assessing  PA pressure.   3. Left atrial size was mildly dilated. No left atrial/left atrial  appendage thrombus was detected.   4. No PFO or ASD by color doppler.   5. The mitral valve is abnormal. There appears to be primarily posterior  leaflet prolapse, but I am concerned for a possible perforation in the  anterior mitral leaflet with a posteriorly-directed mitral regurgitation  jet. Severe mitral valve  regurgitation with PISA ERO 0.42 cm2, 3-D vena contracta area 0.58 cm^2.  There was flattening but not reversal in the pulmonary vein systolic PW  doppler signal. No evidence of mitral stenosis.   6. The aortic valve is tricuspid. Aortic valve regurgitation is not  visualized. No aortic stenosis is present.   FINDINGS   Left Ventricle: Left ventricular ejection fraction, by estimation, is 45  to 50%. The left ventricle has mildly  decreased function. The left  ventricle demonstrates global hypokinesis. The left ventricular internal  cavity size was mildly dilated. There is   no left ventricular hypertrophy.   Right Ventricle: The right ventricular size is normal. No increase in  right ventricular wall thickness. Right ventricular systolic function is  normal. Tricuspid regurgitation signal is inadequate for assessing PA  pressure.   Left Atrium: Left atrial size was mildly dilated. No left atrial/left  atrial appendage thrombus was detected.   Right Atrium: Right atrial size was normal in size.   Pericardium: There is no evidence of pericardial effusion.   Mitral Valve: The mitral valve is abnormal. Severe mitral valve  regurgitation. No evidence of mitral valve stenosis.   Tricuspid Valve: The tricuspid valve is normal in  structure. Tricuspid  valve regurgitation is trivial.   Aortic Valve: The aortic valve is tricuspid. Aortic valve regurgitation is  not visualized. No aortic stenosis is present.   Pulmonic Valve: The pulmonic valve was normal in structure. Pulmonic valve  regurgitation is not visualized.   Aorta: The aortic root is normal in size and structure.   IAS/Shunts: No PFO or ASD by color doppler.     LEFT VENTRICLE  PLAX 2D  LVIDd:         6.20 cm  LVIDs:         4.30 cm     MR Peak grad:    158.3 mmHg  MR Mean grad:    92.0 mmHg  MR Vmax:         629.00 cm/s  MR Vmean:        449.0 cm/s  MR PISA:         7.60 cm  MR PISA Eff ROA: 42 mm  MR PISA Radius:  1.10 cm   Cath (04/2023) Conclusion  1. Borderline elevated RA pressure and PCWP.  2. Low but not markedly low cardiac output.  3. Normal coronaries.     Recent Radiology Findings:       Recent Lab Findings: Lab Results  Component Value Date   WBC 6.1 04/12/2023   HGB 15.6 04/12/2023   HGB 15.3 04/12/2023   HCT 46.0 04/12/2023   HCT 45.0 04/12/2023   PLT 250 04/12/2023   GLUCOSE 98 04/12/2023   ALT 22  01/28/2023   AST 25 01/28/2023   NA 141 04/12/2023   NA 141 04/12/2023   K 4.5 04/12/2023   K 4.5 04/12/2023   CL 108 04/12/2023   CREATININE 1.47 (H) 04/12/2023   BUN 29 (H) 04/12/2023   CO2 22 04/12/2023   TSH 4.045 01/28/2023   INR 2.08 09/01/2017      Assessment / Plan:     58 yo male with NYHA class I-II symptoms of moderate to severe MR with depressed LV function, no CAD or PHTN who has significant pulmonary sarcoidosis with previous cardiomyopathy and CKD. Pt has a complex mitral pathology that appears to have bileaflet prolapse (? Perforation) and is not a low risk surgical candidate with a potential 75% chance of repair surgically with mechanical valve as replacement option. We had a long discussion of the risks of surgery and that secondary to his increased risk, to have his studies reviewed by Dr Lynnette Caffey and mitral clip team to rule this out as a potential option, prior to surgery would be wise. We will then decide on best course of therapy and move forward following that analysis. All the risks and goals and recovery from surgery were discussed and will regroup after mitral clip analysis   I have spent 60 min in review of the records, viewing studies and in face to face with patient and in coordination of future care    Eugenio Hoes 05/05/2023 6:45 PM

## 2023-05-06 ENCOUNTER — Encounter: Payer: Self-pay | Admitting: Thoracic Surgery (Cardiothoracic Vascular Surgery)

## 2023-05-06 ENCOUNTER — Institutional Professional Consult (permissible substitution) (INDEPENDENT_AMBULATORY_CARE_PROVIDER_SITE_OTHER): Payer: Medicare Other | Admitting: Thoracic Surgery (Cardiothoracic Vascular Surgery)

## 2023-05-06 VITALS — BP 128/76 | HR 75 | Resp 18 | Ht 71.0 in | Wt 245.0 lb

## 2023-05-06 DIAGNOSIS — I340 Nonrheumatic mitral (valve) insufficiency: Secondary | ICD-10-CM

## 2023-05-06 NOTE — Patient Instructions (Signed)
 Mitral valve repair (sternotomy) following mitral clip review

## 2023-05-07 ENCOUNTER — Telehealth: Payer: Self-pay

## 2023-05-07 NOTE — Telephone Encounter (Signed)
 Marland Kitchen

## 2023-05-10 ENCOUNTER — Telehealth: Payer: Self-pay

## 2023-05-10 NOTE — Telephone Encounter (Signed)
 Pamelia Hoit:   Fossa looks good for transseptal approach. Adequate space within the LA to achieve device straddle and steer down to the mitral valve plane. Patient appears to have a barlows like valve in appearance with myxomatous leaflets and bilateral leaflet prolapse. Jet appears to be broad based and eccentric in the corresponding bicomm/long axis windows. In a 120 degree view you can leaflet restriction that creates an ~0.4cm coaptation gap during systole. In another 142 degree view, you can see a possible flail posterior leaflet with a similar gap measurement. Before the procedure we will want to perform a bicomm/x-plane sweep to determine where the gap/flail starts and the best place to attempt our first grasp. Regurgitant volume was noted at 84ml which speaks to higher probability of a multiple clip case if the patient's valve and gradient can tolerate 2 or more clips. Long axis views all show posterior leaflet >1.0cm, long enough for our larger clip options. Valve area measured ~7.0cm2 and the patient's gradient was not seen in the study. If planning for a 2 clip case, plan on placing first clip on the medial side of A2P2 with a second clip just lateral, depending on how the MR responds. XT/XTW appropriate.

## 2023-05-12 ENCOUNTER — Telehealth: Payer: Self-pay

## 2023-05-12 ENCOUNTER — Ambulatory Visit: Payer: No Typology Code available for payment source | Admitting: Physician Assistant

## 2023-05-12 NOTE — Telephone Encounter (Signed)
 Discussed the patient in Valve Team meeting yesterday. While it was agreed upon that the patient likely does not have a leaflet perforation, his history of MV endocarditis and tissue integrity may make it more difficult for mTEER options.  Per Dr. Lynnette Caffey, called and spoke with the patient and offered a consult to discuss.  The patient is out of town for the next few weeks. Scheduled him for visit with Dr. Lynnette Caffey 06/28/2023 per request. He was grateful for call and agreed with plan.

## 2023-06-27 NOTE — Progress Notes (Unsigned)
 Patient ID: [name] MRN: [id number] DOB/AGE: [birth year] 58 y.o.  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  He had a very low risk stress test and his chest pain occurred only 1 time.  He had no recurrent chest pain and was exercising moderately without any issues.  He was referred for an echocardiogram.   June 2023: In the interim an echocardiogram demonstrated preserved LV function.  Apparently the patient had an echocardiogram at the [place] which demonstrated severe LV dysfunction.  I do Richard have that report to review but he was started on Jardiance, Entresto , and spironolactone.  He did have a ZIO monitor done in May which demonstrated frequent ventricular ectopy with nonsustained ventricular tachycardia (19% burden over 7 day monitoring period).  The patient denies any shortness of breath however he has had some presyncope maybe twice within the last week.  He has had palpitations that have been more frequent versus when I saw him last time.  He denies any paroxysmal nocturnal dyspnea, orthopnea, signs or symptoms of stroke, or severe bleeding.  He has noticed occasional blood in his stool but he does have hemorrhoids.  He has Richard required emergency room visits  or hospitalizations.  Plan: Start Toprol -XL 25 mg, obtain echocardiogram, start prednisone  60 mg daily; refer to EP, refer to advanced heart failure, obtain PET scan for active cardiac sarcoidosis.  April 2025:  Patient consents to use of AI scribe. The patient is referred for evaluation regarding mitral regurgitation.  In the interim since I saw the patient he was seen by advanced heart failure.  He underwent right heart catheterization and coronary angiography which demonstrated no effective coronary artery disease and relatively normal filling pressures with no exaggerated V waves.  His workup also included a TEE which demonstrated severe mitral vegetation with bileaflet prolapse.  There was a question about whether the patient had a perforated leaflet.  He was seen by Dr. Honey Shepherd for consideration for mitral valve surgery.  He was thought to be Richard a low risk candidate for surgical repair and may require mitral valve replacement.  He is referred for consideration for mitral transcatheter edge-to-edge repair.  He experiences increased shortness of breath, especially during physical activities like treadmill workouts. Recurrent pneumonia has contributed to fatigue and decreased activity levels. He completed two antibiotic courses for pneumonia, the first lasting seven days with prednisone  and the second lasting two weeks.  He has a history of endocarditis and pericarditis from around 2016, before the COVID-19 pandemic. He was hospitalized and treated with antibiotics via a PICC line. This past infection is believed to have contributed to the current mitral valve regurgitation.  He is currently on Humira for sarcoidosis and Xarelto for blood clots. He has experienced hemorrhoidal bleeding, attributed to straining during bowel movements, with no other significant bleeding issues.  Regarding respiratory symptoms, no shortness of breath at rest but increased shortness of breath with activity, exacerbated by  recent pneumonia. He has coughing and expectorating phlegm, associated with pneumonia episodes.     Past Medical History:  Diagnosis Date   Arthritis    "all over" (08/31/2017)   Asthma    Chest pain    Chronic lower back pain    CKD (chronic kidney disease), stage III (HCC)    Richard Shepherd 08/31/2017   DVT (deep venous thrombosis) (HCC) < & 06/2006   a. mainly affect R leg   Endocarditis 2018   Fibromyalgia    Heart murmur    OSA on CPAP    Pericarditis 2017   Positive cardiac stress test    Sarcoidosis    a. eye involvement only    Past Surgical History:  Procedure Laterality Date   CATARACT EXTRACTION W/ INTRAOCULAR LENS  IMPLANT, BILATERAL Bilateral 2012   ENDOVENOUS ABLATION SAPHENOUS VEIN W/ LASER Right 05/29/2020   endovenous laser ablation right greater saphenous vein and stab phlebectomy > 20 incisions by Richard Dynes MD    EYE SURGERY Bilateral 2015   scraped calcium  from cornea   GLAUCOMA SURGERY Bilateral 2012   RIGHT/LEFT HEART CATH AND CORONARY ANGIOGRAPHY N/A 04/12/2023   Procedure: RIGHT/LEFT HEART CATH AND CORONARY ANGIOGRAPHY;  Surgeon: Richard Eisenmenger, MD;  Location: [facility] INVASIVE CV LAB;  Service: Cardiovascular;  Laterality: N/A;   TRANSESOPHAGEAL ECHOCARDIOGRAM (CATH LAB) N/A 03/30/2023   Procedure: TRANSESOPHAGEAL ECHOCARDIOGRAM;  Surgeon: Richard Eisenmenger, MD;  Location: [facility] INVASIVE CV LAB;  Service: Cardiovascular;  Laterality: N/A;    Family History  Problem Relation Age of Onset   Breast cancer Richard Shepherd    Multiple sclerosis Richard Shepherd    Multiple sclerosis Richard Shepherd    Diabetes Richard Shepherd     Social History   Socioeconomic History   Marital status: Married    Spouse name: Richard Shepherd   Number of children: Richard Shepherd   Years of education: Richard Shepherd   Highest education level: Richard Shepherd  Occupational History   Richard Shepherd  Tobacco Use   Smoking status: Former    Current packs/day: 0.12    Average packs/day: 0.1 packs/day for 4.0 years (0.5 ttl pk-yrs)     Types: Cigarettes   Smokeless tobacco: Never   Tobacco comments:    08/21/2017 "stopped in the 1990s"  Vaping Use   Vaping status: Never Used  Substance and Sexual Activity   Alcohol  use: Yes    Comment: 08/31/2017 "might have a margarita once/month; if that"   Drug use: Never   Sexual activity: Yes  Other Topics Concern   Richard Shepherd  Social History Narrative   Richard Shepherd   Social Drivers of Health   Financial Resource Strain: Richard Shepherd  Food Insecurity: Richard Shepherd  Transportation Needs: Richard Shepherd  Physical Activity: Richard Shepherd  Stress: Richard Shepherd  Social Connections: Richard Shepherd  Intimate Partner Violence: Richard Shepherd     Prior to Admission medications   Medication Sig Start Date End Date Taking? Authorizing Provider  acetaminophen  (TYLENOL ) 500 MG tablet Take 1,000 mg by mouth 4 (four) times daily as needed for mild pain.    [provider]  adalimumab (HUMIRA, 2 PEN,) 40 MG/0.4ML pen Inject 40 mg into the skin every 14 (fourteen) days.    [provider]  albuterol  (PROVENTIL  HFA;VENTOLIN  HFA) 108 (90 Base) MCG/ACT  inhaler Inhale 1-2 puffs into the lungs every 6 (six) hours as needed for wheezing or shortness of breath.    [provider]  amiodarone  (PACERONE ) 200 MG tablet Take 1 tablet (200 mg total) by mouth daily. 10/12/22   Richard Eisenmenger, MD  ascorbic acid (VITAMIN C) 500 MG tablet Take 500 mg by mouth daily.    [provider]  Calcium  Carbonate-Vit D-Min (CALCIUM  1200) 1200-1000 MG-UNIT CHEW Chew 1,200 mg by mouth every morning. 09/10/21 11/04/23  Boyce Byes, MD  capsaicin  (ZOSTRIX) 0.025 % cream Apply 1 application topically 2 (two) times daily as needed (skin care).     [provider]  carboxymethylcellulose (REFRESH PLUS) 0.5 % SOLN Place 1 drop into both eyes 2 (two) times daily.    [provider]  Cholecalciferol (VITAMIN D3) 50 MCG (2000 UT) capsule Take 2,000 Units by mouth daily.     [provider]  cyclobenzaprine  (FLEXERIL ) 10 MG tablet Take 10 mg by mouth every 8 (eight) hours as needed for muscle spasms.    [provider]  cycloSPORINE (RESTASIS) 0.05 % ophthalmic emulsion Place 1 drop into both eyes 2 (two) times daily. 12/18/20   [provider]  diclofenac Sodium (VOLTAREN) 1 % GEL Apply 2 g topically 2 (two) times daily as needed (pain). 09/16/20   [provider]  diphenhydrAMINE  (BENADRYL ) 50 MG capsule Take 50 mg by mouth at bedtime.    [provider]  empagliflozin (JARDIANCE) 25 MG TABS tablet Take 12.5 mg by mouth daily. 01/03/21   [provider]  furosemide  (LASIX ) 20 MG tablet Take 1 tablet (20 mg total) by mouth every other day. 01/28/23   Richard Eisenmenger, MD  gabapentin  (NEURONTIN ) 400 MG capsule Take 400-800 mg by mouth See admin instructions. 400 mg during the day, 800 mg at bedtime 04/08/20   [provider]  metoprolol  succinate (TOPROL  XL) 50 MG 24 hr tablet Take 1.5 tablets (75 mg total) by mouth at bedtime. 01/28/23   Richard Eisenmenger, MD  Multiple Vitamins-Minerals (MULTIVITAMIN GUMMIES ADULT PO) Take 2 each by mouth daily.    [provider]  nepafenac (ILEVRO) 0.3 % ophthalmic suspension 1 drop daily. 04/05/20   [provider]  omeprazole  (PRILOSEC) 20 MG capsule Take 1 capsule (20 mg total) by mouth daily. 09/10/21 11/04/23  Boyce Byes, MD  prednisoLONE  acetate (PRED FORTE ) 1 % ophthalmic suspension Place 1 drop into both eyes 2 (two) times daily.    [provider]  Rivaroxaban (XARELTO) 15 MG TABS tablet Take 15 mg by mouth daily with supper.    [provider]  sacubitril-valsartan (ENTRESTO ) 49-51 MG Take 1 tablet by mouth 2 (two) times daily. 01/28/23   McLean, Dalton S, MD  sildenafil (VIAGRA) 100 MG tablet Take 50 mg by mouth daily as needed for erectile dysfunction.    [provider]  spironolactone (ALDACTONE) 25 MG tablet Take  25 mg by mouth daily. 07/17/20   [provider]  testosterone cypionate (DEPOTESTOSTERONE CYPIONATE) 200 MG/ML injection Inject 60 mg into the muscle every 14 (fourteen) days. 12/03/20   [provider]  timolol  (TIMOPTIC -XR) 0.5 % ophthalmic gel-forming Place 1 drop into both eyes daily.    [provider]  traMADol  (ULTRAM ) 50 MG tablet Take 50 mg by mouth 3 (three) times daily as needed for moderate pain. Maximum dose= 8 tablets per day For pain    [provider]  urea 10 % lotion  Apply 1 Application topically daily as needed for dry skin. 01/12/21   [provider]  valACYclovir  (VALTREX ) 1000 MG tablet Take 1,000 mg by mouth daily.    [provider]    Allergies  Allergen Reactions   Other Other (See Comments)    Dogs and Cats   Humira (2 Pen) [Adalimumab] Rash    Generic causes skin rash     REVIEW OF SYSTEMS:  General: no fevers/chills/night sweats Eyes: no blurry vision, diplopia, or amaurosis ENT: no sore throat or hearing loss Resp: no cough, wheezing, or hemoptysis CV: no edema or palpitations GI: no abdominal pain, nausea, vomiting, diarrhea, or constipation GU: no dysuria, frequency, or hematuria Skin: no rash Neuro: no headache, numbness, tingling, or weakness of extremities Musculoskeletal: no joint pain or swelling Heme: no bleeding, DVT, or easy bruising Endo: no polydipsia or polyuria  BP 110/72   Pulse 70   Ht 5\' 11"  (1.803 m)   Wt 235 lb (106.6 kg)   SpO2 98%   BMI 32.78 kg/m   PHYSICAL EXAM: GEN:  AO x 3 in no acute distress HEENT: normal Dentition: Normal Neck: JVP normal. +2 carotid upstrokes without bruits. No thyromegaly. Lungs: equal expansion, clear bilaterally CV: Apex is discrete and nondisplaced, RRR with 3/6 systolic murmur left axilla Abd: soft, non-tender, non-distended; no bruit; positive bowel sounds Ext: no edema, ecchymoses, or cyanosis Vascular: 2+ femoral pulses, 2+ radial  pulses       Skin: warm and dry without rash Neuro: CN II-XII grossly intact; motor and sensory grossly intact    DATA AND STUDIES:  EKG:  EKG Interpretation Date/Time:    Ventricular Rate:    PR Interval:    QRS Duration:    QT Interval:    QTC Calculation:   R Axis:      Text Interpretation:          Cardiac Studies & Procedures   ______________________________________________________________________________________________ CARDIAC CATHETERIZATION  CARDIAC CATHETERIZATION 04/12/2023  Conclusion 1. Borderline elevated RA pressure and PCWP. 2. Low but Richard markedly low cardiac output. 3. Normal coronaries.  Findings Coronary Findings Diagnostic  Dominance: Right  Left Main Vessel was injected. Vessel is normal in caliber. Vessel is angiographically normal.  Left Anterior Descending Vessel was injected. Vessel is normal in caliber. Vessel is angiographically normal.  Left Circumflex Vessel was injected. Vessel is normal in caliber. Vessel is angiographically normal.  Right Coronary Artery Vessel was injected. Vessel is normal in caliber. Vessel is angiographically normal.  Intervention  No interventions have been documented.     ECHOCARDIOGRAM  ECHOCARDIOGRAM COMPLETE 03/05/2023  Narrative ECHOCARDIOGRAM REPORT    Patient Name:   Richard Shepherd Date of Exam: 03/05/2023 Medical Rec #:  [id number]       Height:       71.0 in Accession #:    [id number]      Weight:       251.0 lb Date of Birth:  1965-12-02       BSA:          2.322 m Patient Age:    57 years        BP:           100/60 mmHg Patient Gender: M               HR:           76 bpm. Exam Location:  Outpatient  Procedure: 2D Echo, Color Doppler, Cardiac Doppler and Strain Analysis  Indications:  I50.9 Congestive Heart Failure  History:        Patient has prior history of Echocardiogram examinations. Pericarditis, Sarcoidosis, Asthma; Risk Factors:Sleep Apnea.  Sonographer:    L.  Thornton-Maynard, RDCS Referring Phys: [id number] DALTON S MCLEAN  IMPRESSIONS   1. Left ventricular ejection fraction, by estimation, is 50%. The left ventricle has mildly decreased function. The left ventricle has no regional wall motion abnormalities. There is moderate left ventricular hypertrophy. Left ventricular diastolic parameters are indeterminate. The average left ventricular global longitudinal strain is -13.5 %. The global longitudinal strain is abnormal. 2. Right ventricular systolic function is normal. The right ventricular size is normal. 3. Left atrial size was mildly dilated. 4. Eccentric mitral regurgitation with prolapse and splay artifact. Severity may be underestimated. The mitral valve is abnormal. Moderate mitral valve regurgitation. No evidence of mitral stenosis. 5. The aortic valve was Richard well visualized. Aortic valve regurgitation is Richard visualized.  Comparison(s): Prior images reviewed side by side. MR is worse that prior.  FINDINGS Left Ventricle: Left ventricular ejection fraction, by estimation, is 50%. The left ventricle has mildly decreased function. The left ventricle has no regional wall motion abnormalities. The average left ventricular global longitudinal strain is -13.5 %. The global longitudinal strain is abnormal. The left ventricular internal cavity size was normal in size. There is moderate left ventricular hypertrophy. Left ventricular diastolic parameters are indeterminate.  Right Ventricle: The right ventricular size is normal. No increase in right ventricular wall thickness. Right ventricular systolic function is normal.  Left Atrium: Left atrial size was mildly dilated.  Right Atrium: Right atrial size was normal in size.  Pericardium: There is no evidence of pericardial effusion.  Mitral Valve: Eccentric mitral regurgitation with prolapse and splay artifact. Severity may be underestimated. The mitral valve is abnormal. Moderate mitral valve  regurgitation. No evidence of mitral valve stenosis. MV peak gradient, 4.7 mmHg. The mean mitral valve gradient is 3.0 mmHg.  Tricuspid Valve: The tricuspid valve is normal in structure. Tricuspid valve regurgitation is Richard demonstrated. No evidence of tricuspid stenosis.  Aortic Valve: The aortic valve was Richard well visualized. Aortic valve regurgitation is Richard visualized. Aortic valve mean gradient measures 4.5 mmHg. Aortic valve peak gradient measures 8.2 mmHg. Aortic valve area, by VTI measures 2.13 cm.  Pulmonic Valve: The pulmonic valve was normal in structure. Pulmonic valve regurgitation is Richard visualized.  Aorta: The aortic root and ascending aorta are structurally normal, with no evidence of dilitation.  IAS/Shunts: The atrial septum is grossly normal.   LEFT VENTRICLE PLAX 2D LVIDd:         5.80 cm      Diastology LVIDs:         4.10 cm      LV e' medial:    10.70 cm/s LV PW:         1.40 cm      LV E/e' medial:  9.8 LV IVS:        1.40 cm      LV e' lateral:   11.40 cm/s LVOT diam:     2.30 cm      LV E/e' lateral: 9.2 LV SV:         51 LV SV Index:   22           2D Longitudinal Strain LVOT Area:     4.15 cm     2D Strain GLS Avg:     -13.5 %  LV Volumes (MOD) LV vol d, MOD  A2C: 175.0 ml LV vol d, MOD A4C: 182.0 ml LV vol s, MOD A2C: 93.7 ml LV vol s, MOD A4C: 91.3 ml LV SV MOD A2C:     81.3 ml LV SV MOD A4C:     182.0 ml LV SV MOD BP:      83.7 ml  RIGHT VENTRICLE             IVC RV Basal diam:  3.10 cm     IVC diam: 0.40 cm RV S prime:     15.70 cm/s TAPSE (M-mode): 2.4 cm  LEFT ATRIUM           Index        RIGHT ATRIUM           Index LA diam:      4.50 cm 1.94 cm/m   RA Area:     15.00 cm LA Vol (A4C): 91.9 ml 39.57 ml/m  RA Volume:   33.50 ml  14.43 ml/m AORTIC VALVE                    PULMONIC VALVE AV Area (Vmax):    1.58 cm     PV Vmax:       0.75 m/s AV Area (Vmean):   1.64 cm     PV Peak grad:  2.3 mmHg AV Area (VTI):     2.13 cm AV Vmax:            143.00 cm/s AV Vmean:          93.650 cm/s AV VTI:            0.240 m AV Peak Grad:      8.2 mmHg AV Mean Grad:      4.5 mmHg LVOT Vmax:         54.50 cm/s LVOT Vmean:        36.900 cm/s LVOT VTI:          0.123 m LVOT/AV VTI ratio: 0.51  AORTA Ao Root diam: 3.20 cm Ao Asc diam:  2.90 cm  MITRAL VALVE MV Area (PHT): 3.89 cm       SHUNTS MV Area VTI:   1.56 cm       Systemic VTI:  0.12 m MV Peak grad:  4.7 mmHg       Systemic Diam: 2.30 cm MV Mean grad:  3.0 mmHg MV Vmax:       1.08 m/s MV Vmean:      81.4 cm/s MV Decel Time: 195 msec MR Peak grad:    83.7 mmHg MR Mean grad:    54.5 mmHg MR Vmax:         457.50 cm/s MR Vmean:        348.5 cm/s MR PISA:         1.90 cm MR PISA Eff ROA: 16 mm MR PISA Radius:  0.55 cm MV E velocity: 105.00 cm/s MV A velocity: 66.50 cm/s MV E/A ratio:  1.58  Gloriann Larger MD Electronically signed by Gloriann Larger MD Signature Date/Time: 03/05/2023/4:06:12 PM    Final   TEE  ECHO TEE 03/30/2023  Narrative TRANSESOPHOGEAL ECHO REPORT    Patient Name:   Richard Shepherd Date of Exam: 03/30/2023 Medical Rec #:  [id number]       Height:       71.0 in Accession #:    [id number]      Weight:       250.0 lb Date of Birth:  August 21, 1965       BSA:          2.318 m Patient Age:    57 years        BP:           135/85 mmHg Patient Gender: M               HR:           82 bpm. Exam Location:  Inpatient  Procedure: Transesophageal Echo, 3D Echo, Cardiac Doppler and Color Doppler  Indications:     Mitral Valve Abnormality  History:         Patient has prior history of Echocardiogram examinations, most recent 03/05/2023. Mitral Valve Disease; Signs/Symptoms:Murmur.  Sonographer:     Astrid Blamer Referring Phys:  [id number] Jolinda Necessary MCLEAN Diagnosing Phys: Archer Bear  PROCEDURE: After discussion of the risks and benefits of a TEE, an informed consent was obtained from the patient. The transesophogeal probe was  passed without difficulty through the esophogus of the patient. Sedation performed by different physician. The patient was monitored while under deep sedation. Anesthestetic sedation was provided intravenously by Anesthesiology: 430mg  of Propofol , 100mg  of Lidocaine . The patient developed no complications during the procedure.  IMPRESSIONS   1. Left ventricular ejection fraction, by estimation, is 45 to 50%. The left ventricle has mildly decreased function. The left ventricle demonstrates global hypokinesis. The left ventricular internal cavity size was mildly dilated. 2. Right ventricular systolic function is normal. The right ventricular size is normal. Tricuspid regurgitation signal is inadequate for assessing PA pressure. 3. Left atrial size was mildly dilated. No left atrial/left atrial appendage thrombus was detected. 4. No PFO or ASD by color doppler. 5. The mitral valve is abnormal. There appears to be primarily posterior leaflet prolapse, but I am concerned for a possible perforation in the anterior mitral leaflet with a posteriorly-directed mitral regurgitation jet. Severe mitral valve regurgitation with PISA ERO 0.42 cm2, 3-D vena contracta area 0.58 cm^2. There was flattening but Richard reversal in the pulmonary vein systolic PW doppler signal. No evidence of mitral stenosis. 6. The aortic valve is tricuspid. Aortic valve regurgitation is Richard visualized. No aortic stenosis is present.  FINDINGS Left Ventricle: Left ventricular ejection fraction, by estimation, is 45 to 50%. The left ventricle has mildly decreased function. The left ventricle demonstrates global hypokinesis. The left ventricular internal cavity size was mildly dilated. There is no left ventricular hypertrophy.  Right Ventricle: The right ventricular size is normal. No increase in right ventricular wall thickness. Right ventricular systolic function is normal. Tricuspid regurgitation signal is inadequate for assessing PA  pressure.  Left Atrium: Left atrial size was mildly dilated. No left atrial/left atrial appendage thrombus was detected.  Right Atrium: Right atrial size was normal in size.  Pericardium: There is no evidence of pericardial effusion.  Mitral Valve: The mitral valve is abnormal. Severe mitral valve regurgitation. No evidence of mitral valve stenosis.  Tricuspid Valve: The tricuspid valve is normal in structure. Tricuspid valve regurgitation is trivial.  Aortic Valve: The aortic valve is tricuspid. Aortic valve regurgitation is Richard visualized. No aortic stenosis is present.  Pulmonic Valve: The pulmonic valve was normal in structure. Pulmonic valve regurgitation is Richard visualized.  Aorta: The aortic root is normal in size and structure.  IAS/Shunts: No PFO or ASD by color doppler.   LEFT VENTRICLE PLAX 2D LVIDd:         6.20 cm LVIDs:  4.30 cm   MR Peak grad:    158.3 mmHg MR Mean grad:    92.0 mmHg MR Vmax:         629.00 cm/s MR Vmean:        449.0 cm/s MR PISA:         7.60 cm MR PISA Eff ROA: 42 mm MR PISA Radius:  1.10 cm  Dalton McleanMD Electronically signed by Archer Bear Signature Date/Time: 03/30/2023/2:50:36 PM    Final  MONITORS  LONG TERM MONITOR (3-14 DAYS) 01/02/2022  Narrative Patch Wear Time:  2 days and 11 hours (2023-10-17T12:04:32-0400 to 2023-10-19T23:46:21-0400)  Patient had a min HR of 56 bpm, max HR of 197 bpm, and avg HR of 73 bpm. Predominant underlying rhythm was Sinus Rhythm. First Degree AV Block was present. Bundle Branch Block/IVCD was present. 1 Ventricular Tachycardia runs occurred, the run with the fastest interval lasting 4 beats with a max rate of 197 bpm, the longest lasting 4 beats with an avg rate of 155 bpm. No Isolated SVEs, SVE Couplets, or SVE Triplets were present. Isolated VEs were occasional (1.5%, 3779), VE Couplets were rare (<1.0%, 3), and no VE Triplets were present. Ventricular Bigeminy was  present.  Conclusion: 1. One 4 beat runs NSVT 2. 1.5% PVCs 3. Predominantly NSR   CT SCANS  CT CORONARY MORPH W/CTA COR W/SCORE 09/10/2021  Addendum 09/10/2021  2:48 PM ADDENDUM REPORT: 09/10/2021 14:45  CLINICAL DATA:  Cardiomyopathy  EXAM: Cardiac/Coronary CTA  TECHNIQUE: A non-contrast, gated CT scan was obtained with axial slices of 3 mm through the heart for calcium  scoring. Calcium  scoring was performed using the Agatston method. A 110 kV prospective, gated, contrast cardiac scan was obtained. Gantry rotation speed was 250 msecs and collimation was 0.6 mm. Two sublingual nitroglycerin  tablets (0.8 mg) were given. The 3D data set was reconstructed in 5% intervals of the 35-75% of the R-R cycle. Diastolic phases were analyzed on a dedicated workstation using MPR, MIP, and VRT modes. The patient received 95 cc of contrast.  FINDINGS: Image quality: Excellent.  Noise artifact is: Limited.  Coronary Arteries:  Normal coronary origin.  Right dominance.  Left main: The left main is a large caliber vessel with a normal take off from the left coronary cusp that bifurcates to form a left anterior descending artery and a left circumflex artery. There is no plaque or stenosis.  Left anterior descending artery: The LAD is patent without evidence of plaque or stenosis. The LAD gives off 2 patent diagonal branches.  Left circumflex artery: The LCX is non-dominant and patent with no evidence of plaque or stenosis. The LCX gives off 3 patent obtuse marginal branches.  Right coronary artery: The RCA is dominant with normal take off from the right coronary cusp. There is no evidence of plaque or stenosis. The RCA terminates as a PDA and right posterolateral branch without evidence of plaque or stenosis.  Right Atrium: Right atrial size is within normal limits.  Right Ventricle: The right ventricular cavity is within normal limits.  Left Atrium: Left atrial size is normal in  size with no left atrial appendage filling defect.  Left Ventricle: The ventricular cavity size is severely dilated.  Pulmonary arteries: Dilated, suggestive of pulmonary hypertension.  Pulmonary veins: Normal pulmonary venous drainage.  Pericardium: Normal thickness without significant effusion or calcium  present.  Cardiac valves: The aortic valve is trileaflet without significant calcification. The mitral valve is normal without significant calcification.  Aorta: Normal caliber without significant disease.  Extra-cardiac findings: See attached radiology report for non-cardiac structures.  IMPRESSION: 1. Coronary calcium  score of 0.  2. Normal coronary origin with right dominance.  3. Normal coronary arteries.  4. Severely dilated left ventricle.  5. Dilated pulmonary artery suggestive of pulmonary hypertension.  RECOMMENDATIONS: 1. No evidence of CAD (0%). Non-ischemic cardiomyopathy. Would recommend cardiac MRI to evaluate for cardiac sarcoidosis.  Jackquelyn Mass, MD   Electronically Signed By: Jackquelyn Mass M.D. On: 09/10/2021 14:45  Narrative EXAM: OVER-READ INTERPRETATION  CT CHEST  The following report is a limited chest CT over-read performed by radiologist Dr. Lore Rode of [facility] on 09/10/2021. This over-read does Richard include interpretation of cardiac or coronary anatomy or pathology. The coronary CTA interpretation by the cardiologist is attached.  COMPARISON:  12/10/2020 chest radiograph  FINDINGS: Vascular: Aortic atherosclerosis. No central pulmonary embolism, on this non-dedicated study.  Pulmonary artery enlargement, outflow tract 3.3 cm.  Mediastinum/Nodes: Extensive mediastinal and right hilar calcified adenopathy. Example nodal mass within the subcarinal station with extension into the azygoesophageal recess. 4.7 x 2.9 cm on 14/11.  Right hilar node of 2.8 cm on 05/11.  Lungs/Pleura: No pleural fluid. Right middle  lobe and less so posterior lingular scarring.  Peri fissural nodularity, including a 5 mm nodule along the right major fissure on 25/2.  Upper Abdomen: Normal imaged portions of the liver.  Musculoskeletal: No acute osseous abnormality.  IMPRESSION: Extensive calcified thoracic adenopathy with mild Perifissural nodularity, most consistent with the EMR history of sarcoidosis.  Pulmonary artery enlargement suggests pulmonary arterial hypertension.  Aortic Atherosclerosis (ICD10-I70.0).  Electronically Signed: By: Lore Rode M.D. On: 09/10/2021 13:18   CARDIAC MRI  MR CARDIAC MORPHOLOGY W WO CONTRAST 04/23/2022  Narrative : CLINICAL DATA:  Clinical question of cardiac sarcoidosis  Study assumes HCT of 53 and BSA of 2.39 m2.  EXAM:  CARDIAC MRI  TECHNIQUE:  The patient was scanned on a 1.5 Tesla GE magnet. A dedicated cardiac coil was used. Functional imaging was done using Fiesta sequences. 2,3, and 4 chamber views were done to assess for RWMA's. Modified Simpson's rule using a short axis stack was used to calculate an ejection fraction on a dedicated work [employer]. The patient received 14 cc of Gadavist . After 10 minutes inversion recovery sequences were used to assess for infiltration and scar tissue. Flow quantification was performed 2 times during this examination with flow quantification performed at the levels of the ascending aorta above the valve, pulmonary artery above the valve.  CONTRAST:  14 cc of Gadavist   FINDINGS:  1. Severe dilation of the left ventricular size, with LVEDD 56 mm, but LVEDVi 128 mL/m2. Absolute volume 306 mL.  Normal left ventricular thickness, with intraventricular septal thickness of 5 mm, posterior wall thickness of 5 mm, and myocardial mass index of 65 g/m2.  Low normal left ventricular systolic function (LVEF =50%). There are no regional wall motion abnormalities.  Left ventricular parametric  mapping notable for elevated T2 signal in the apical lateral (67 ms) suggestive of edema.  Normal ECV.  There is late gadolinium enhancement in the left ventricular myocardium. Patchy LGE in the basal inferolateral segment. There is no papillary muscle LGE. There is no apical LGE.  2. Mild dilation of the right ventricular size with RVEDVI 88 mL/m2.  Normal right ventricular thickness.  Normal right ventricular systolic function (RVEF =51%). There are no regional wall motion abnormalities or aneurysms.  3.  Normal left and right atrial size.  4. Normal size of the aortic root, ascending aorta and pulmonary artery.  5. Valve assessment:  Aortic Valve: Valve morphology Richard well visualized due to free breathing artifact. There is mild central regurgitation. Regurgitant fraction 9%.  Pulmonic Valve: There is mild central regurgitation. Regurgitant fraction 6%.  Tricuspid Valve: There is mild central regurgitation. Regurgitant fraction 9%.  Mitral Valve: The mitral valve is abnormal. There is bileaflet billowing without frank prolapse. There is an small < 1 cm density on the posterior leaflet, in the setting of a history of prior mitral valve endocarditis this is consistent with a healed endocarditis. Notable free breathing artifact. Qualitatively mild mitral regurgitation.  6.  Normal pericardium.  No pericardial effusion.  7. Hilar lymphadenopathy noted. This has been noted on prior non cardiac imaging. Recommended dedicated study if concerned for non-cardiac pathology.  8. Artifacts noted- study was done largely free breathing. Mitral regurgitation quantification is limited.  IMPRESSION:  LVEF 50% with severe LV dilation.  Compared to outside reporting (prior study) Basal LGE persists with resolution of prior apical LGE. Clinically this may represent improvement of cardiac sarcoidosis activity on immunosuppressive therapy.  Mitral valve is suggestive of prior  endocarditis. Free breathing study limited accurate MR quantification.   Electronically Signed By: Gloriann Larger M.D. On: 04/24/2022 11:54   ______________________________________________________________________________________________      01/28/2023: ALT 22; B Natriuretic Peptide 29.6; TSH 4.045 04/12/2023: BUN 29; Creatinine, Ser 1.47; Hemoglobin 15.6; Hemoglobin 15.3; Platelets 250; Potassium 4.5; Potassium 4.5; Sodium 141; Sodium 141   STS RISK CALCULATOR: Pending  NHYA CLASS: 2     ASSESSMENT AND PLAN:   1. Mitral valve insufficiency, unspecified etiology   2. NICM (nonischemic cardiomyopathy) (HCC)   3. Sarcoidosis   4. Stage 3a chronic kidney disease (HCC)   5. History of DVT (deep vein thrombosis)   6. Secondary hypercoagulable state (HCC)   7. BMI 34.0-34.9,adult     Mitral regurgitation: I viewed the patient's echocardiogram and this was also reviewed by the vendors.  There seems to be severe bileaflet prolapse.  I will review the study to completely exclude perforation though I think this is unlikely.  My only reason to pause about this is his history of endocarditis.  I had a long conversation with the patient and his significant other.  Mitral transcatheter edge-to-edge repair would Richard preclude a subsequent mitral valve replacement (with a chemical prosthesis given his need for obligate indefinite anticoagulation) if an inadequate result was Richard achieved.  Additionally given the possibility of healed endocarditis and therefore perhaps suboptimal tissue quality I think mitral transcatheter edge-to-edge repair with up Edwards Pascal device would be more advantageous.  After speaking with the patient about various options he would like to proceed with mitral transcatheter edge-to-edge repair.  He has vacation scheduled for the last week of May and would like to pursue this following [place] from [place]. Nonischemic cardiomyopathy: Continue Lasix  20 mg every  other day, Entresto  49 x 51 mg twice daily, spironolactone 25 mg daily, Jardiance 12.5 mg daily and Toprol  75 mg daily. Sarcoidosis: Has cardiac and extracardiac involvement.  Continue Humira 40 mg every 2 weeks. CKD stage IIIa: Continue Entresto  49 x 51 mg twice daily and Jardiance 12.5 mg daily. Recurrent DVT: Continue Xarelto 15 mg daily. Secondary hypercoagulable state: Continue Xarelto 15 mg daily. Elevated BMI: Diet and exercise modification once valve is been addressed.  I have personally reviewed the patients imaging data as summarized above.  I have reviewed the  natural history of mitral regurgitation with the patient and family members who are present today. We have discussed the limitations of medical therapy and the poor prognosis associated with symptomatic mitral regurgitation. We have also reviewed potential treatment options, including palliative medical therapy, conventional mitral surgery, and transcatheter mitral edge-to-edge repair. We discussed treatment options in the context of this patient's specific comorbid medical conditions.   All of the patient's questions were answered today. Will make further recommendations based on the results of studies outlined above.   I spent 50 minutes reviewing all clinical data during and prior to this visit including all relevant imaging studies, laboratories, clinical information from other health systems and prior notes from both Cardiology and other specialties, interviewing the patient, conducting a complete physical examination, and coordinating care in order to formulate a comprehensive and personalized evaluation and treatment plan.   Asuncion Shibata K Evalyne Cortopassi, MD  06/28/2023 4:57 PM    [facility] [address] [place], [place], [place]  [address] Phone: [phone]; Fax: [phone]

## 2023-06-28 ENCOUNTER — Ambulatory Visit: Attending: Internal Medicine | Admitting: Internal Medicine

## 2023-06-28 ENCOUNTER — Encounter: Payer: Self-pay | Admitting: Internal Medicine

## 2023-06-28 VITALS — BP 110/72 | HR 70 | Ht 71.0 in | Wt 235.0 lb

## 2023-06-28 DIAGNOSIS — Z86718 Personal history of other venous thrombosis and embolism: Secondary | ICD-10-CM | POA: Insufficient documentation

## 2023-06-28 DIAGNOSIS — I428 Other cardiomyopathies: Secondary | ICD-10-CM | POA: Diagnosis present

## 2023-06-28 DIAGNOSIS — N1831 Chronic kidney disease, stage 3a: Secondary | ICD-10-CM | POA: Diagnosis not present

## 2023-06-28 DIAGNOSIS — Z6834 Body mass index (BMI) 34.0-34.9, adult: Secondary | ICD-10-CM | POA: Diagnosis present

## 2023-06-28 DIAGNOSIS — D869 Sarcoidosis, unspecified: Secondary | ICD-10-CM | POA: Insufficient documentation

## 2023-06-28 DIAGNOSIS — D6869 Other thrombophilia: Secondary | ICD-10-CM | POA: Diagnosis present

## 2023-06-28 DIAGNOSIS — I340 Nonrheumatic mitral (valve) insufficiency: Secondary | ICD-10-CM | POA: Insufficient documentation

## 2023-06-28 NOTE — Patient Instructions (Signed)
 Medication Instructions:  Your physician recommends that you continue on your current medications as directed. Please refer to the Current Medication list given to you today.  *If you need a refill on your cardiac medications before your next appointment, please call your pharmacy*  Lab Work: NONE If you have labs (blood work) drawn today and your tests are completely normal, you will receive your results only by: MyChart Message (if you have MyChart) OR A paper copy in the mail If you have any lab test that is abnormal or we need to change your treatment, we will call you to review the results.  Testing/Procedures: NONE  Follow-Up: At [facility], you and your health needs are our priority.  As part of our continuing mission to provide you with exceptional heart care, our providers are all part of one team.  This team includes your primary Cardiologist (physician) and Advanced Practice Providers or APPs (Physician Assistants and Nurse Practitioners) who all work together to provide you with the care you need, when you need it.  Your next appointment:   To be determined  Provider:   Lorie Rook, MD  We recommend signing up for the patient portal called "MyChart".  Sign up information is provided on this After Visit Summary.  MyChart is used to connect with patients for Virtual Visits (Telemedicine).  Patients are able to view lab/test results, encounter notes, upcoming appointments, etc.  Non-urgent messages can be sent to your provider as well.   To learn more about what you can do with MyChart, go to [email or web address].   Other Instructions Someone will reach out to you regarding the next steps for your procedure      1st Floor: - Lobby - Registration  - Pharmacy  - Lab - Cafe  2nd Floor: - PV Lab - Diagnostic Testing (echo, CT, nuclear med)  3rd Floor: - Vacant  4th Floor: - TCTS (cardiothoracic surgery) - AFib Clinic - Structural Heart Clinic -  Vascular Surgery  - Vascular Ultrasound  5th Floor: - HeartCare Cardiology (general and EP) - Clinical Pharmacy for coumadin , hypertension, lipid, weight-loss medications, and med management appointments    Valet parking services will be available as well.

## 2023-06-29 ENCOUNTER — Telehealth (HOSPITAL_COMMUNITY): Payer: Self-pay | Admitting: Vascular Surgery

## 2023-06-29 NOTE — Telephone Encounter (Signed)
 Lvm to make next ava appt w/ NP/APP

## 2023-06-30 ENCOUNTER — Ambulatory Visit: Admitting: Cardiology

## 2023-07-05 ENCOUNTER — Telehealth (HOSPITAL_COMMUNITY): Payer: Self-pay

## 2023-07-05 NOTE — Telephone Encounter (Signed)
 Front office left patient a voice message to reschedule appt from 07/08/23 to a different day. Provider will not be in office on Thursday, Jul 08, 2023 and per [employer], RN, patient will need to be rescheduled.

## 2023-07-06 ENCOUNTER — Ambulatory Visit: Admitting: Nurse Practitioner

## 2023-07-08 ENCOUNTER — Encounter (HOSPITAL_COMMUNITY)

## 2023-07-12 ENCOUNTER — Ambulatory Visit: Admitting: Cardiology

## 2023-07-15 ENCOUNTER — Telehealth (HOSPITAL_COMMUNITY): Payer: Self-pay

## 2023-07-15 NOTE — Telephone Encounter (Signed)
 Called to confirm/remind patient of their appointment at the [facility] on 07/16/2023 11:00.   Appointment:   [x] Confirmed  [] Left mess   [] No answer/No voice mail  [] VM Full/unable to leave message  [] Phone not in service  Patient reminded to bring all medications and/or complete list.  Confirmed patient has transportation. Gave directions, instructed to utilize valet parking.

## 2023-07-15 NOTE — Progress Notes (Addendum)
 PCP: [facility] Cardiology: Dr. Lorie Rook EP: Dr. Marven Slimmer HF Cardiology: Dr. Mitzie Anda  58 y.o. 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    Sarcoidosis was diagnosed in 1990s by lung biopsy.  He has no family history of sarcoidosis, it was thought to possibly be triggered by exposures during the [place] War (patient served in the army).  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 Back in 10/20, cardiac MRI was concerning for CS => subendocardial diffuse basal LGE not in a coronary distribution.  Echo in 10/21 showed EF 40-45%. Echo in 6/23 at [facility] showed EF 25-30%. Patient had a Zio monitor in 5/23 showing 19% PVCs.  He was seen by Dr. Lorie Rook who was concerned for active cardiac sarcoidosis.  Coronary CTA was done in 7/23 showed no significant CAD. Patient was started on po steroids by Dr. Lorie Rook and was referred to Dr. Marven Slimmer for EP evaluation.  Patient has been on adalimumab through his rheumatologist at the [facility]. Prior to this, he was on mycophenolate .  He says that he has not taken methotrexate.   Cardiac PET in 8/23 showed EF 43%, no abnormal metabolism to suggest active sarcoidosis.   Zio 2 day (10/23) Mostly NSR, 1.5 % PVC burden.  cMRI (2/24) showed LVEF 50% with severe dilation, RVEF  51%, mitral valve with evidence of prior endocarditis with mild MR, basal LGE (resolution of prior apical LGE).  Repeat cardiac PET in 11/24 showed EF 38%, no active inflammation.   Echo 12/24: EF 50%, possibly severe MR. Later had TEE in 01/25 which showed EF 45-50% and severe MR with bileaflet prolapse and possible perforation of anterior mitral leaflet  R/LHC 02/25: Normal cors, borderline elevated RA and PCWP, Fick CI  2.02  He was recently evaluated by structural team and is planning to proceed with mTEER.  Here today for CHF follow-up. Walks on the treadmill for 20-30 minutes most days a week without significant dyspnea, orthopnea or PND. Has been dealing with cough when he lies down the last few weeks. This is associated with congestion and mucous production. Struggling with recurrent PNA and reports hx of sinus infections. Treated for PNA with steroids and antibiotics through the [place] in 11/24 and 03/25. [facility]  No lower extremity edema. Has been losing weight. Down 17 lb since last visit with Ozempic.  Taking all medications as prescribed.   Social History   Socioeconomic History   Marital status: Married    Spouse name: Not on file   Number of children: Not on file   Years of education: Not on file   Highest education level: Not on file  Occupational History   Not on file  Tobacco Use   Smoking status: Former    Current packs/day: 0.12    Average packs/day: 0.1 packs/day for 4.0 years (0.5 ttl pk-yrs)    Types: Cigarettes   Smokeless tobacco: Never   Tobacco comments:    08/21/2017 "stopped in the 1990s"  Vaping Use   Vaping status: Never Used  Substance and Sexual Activity   Alcohol  use: Yes    Comment: 08/31/2017 "might have a margarita once/month; if that"   Drug use: Never   Sexual activity: Yes  Other Topics Concern  Not on file  Social History Narrative   Not on file   Social Drivers of Health   Financial Resource Strain: Not on file  Food Insecurity: Not on file  Transportation Needs: Not on file  Physical Activity: Not on file  Stress: Not on file  Social Connections: Not on file  Intimate Partner Violence: Not on file   Family History  Problem Relation Age of Onset   Breast cancer Mother    Multiple sclerosis Father    Multiple sclerosis Brother    Diabetes Brother    ROS: All systems reviewed and negative except as per HPI.   Current  Outpatient Medications  Medication Sig Dispense Refill   acetaminophen  (TYLENOL ) 500 MG tablet Take 1,000 mg by mouth 4 (four) times daily as needed for mild pain.     adalimumab (HUMIRA, 2 PEN,) 40 MG/0.4ML pen Inject 40 mg into the skin every 14 (fourteen) days.     albuterol  (PROVENTIL  HFA;VENTOLIN  HFA) 108 (90 Base) MCG/ACT inhaler Inhale 1-2 puffs into the lungs every 6 (six) hours as needed for wheezing or shortness of breath.     ascorbic acid (VITAMIN C) 500 MG tablet Take 500 mg by mouth daily.     Calcium  Carbonate-Vit D-Min (CALCIUM  1200) 1200-1000 MG-UNIT CHEW Chew 1,200 mg by mouth every morning. 30 tablet 5   capsaicin  (ZOSTRIX) 0.025 % cream Apply 1 application topically 2 (two) times daily as needed (skin care).      carboxymethylcellulose (REFRESH PLUS) 0.5 % SOLN Place 1 drop into both eyes 2 (two) times daily.     Cholecalciferol (VITAMIN D3) 50 MCG (2000 UT) capsule Take 2,000 Units by mouth daily.     cyclobenzaprine  (FLEXERIL ) 10 MG tablet Take 10 mg by mouth every 8 (eight) hours as needed for muscle spasms.     cycloSPORINE (RESTASIS) 0.05 % ophthalmic emulsion Place 1 drop into both eyes 2 (two) times daily.     diclofenac Sodium (VOLTAREN) 1 % GEL Apply 2 g topically 2 (two) times daily as needed (pain).     diphenhydrAMINE  (BENADRYL ) 50 MG capsule Take 50 mg by mouth at bedtime.     empagliflozin (JARDIANCE) 25 MG TABS tablet Take 12.5 mg by mouth daily.     gabapentin  (NEURONTIN ) 400 MG capsule Take 400-800 mg by mouth See admin instructions. 400 mg during the day, 800 mg at bedtime     metoprolol  succinate (TOPROL  XL) 50 MG 24 hr tablet Take 1.5 tablets (75 mg total) by mouth at bedtime. 90 tablet 3   Multiple Vitamins-Minerals (MULTIVITAMIN GUMMIES ADULT PO) Take 2 each by mouth daily.     nepafenac (ILEVRO) 0.3 % ophthalmic suspension 1 drop daily.     omeprazole  (PRILOSEC) 20 MG capsule Take 1 capsule (20 mg total) by mouth daily. 175 capsule 0   prednisoLONE   acetate (PRED FORTE ) 1 % ophthalmic suspension Place 1 drop into both eyes 2 (two) times daily.     rivaroxaban (XARELTO) 20 MG TABS tablet Take 20 mg by mouth daily.     sacubitril-valsartan (ENTRESTO ) 97-103 MG Take 1 tablet by mouth 2 (two) times daily.     Semaglutide,0.25 or 0.5MG /DOS, 2 MG/3ML SOPN Inject into the skin. TAKES EVERY MONDAY     sildenafil (VIAGRA) 100 MG tablet Take 50 mg by mouth daily as needed for erectile dysfunction.     spironolactone (ALDACTONE) 25 MG tablet Take 25 mg by mouth daily.     testosterone cypionate (DEPOTESTOSTERONE CYPIONATE) 200 MG/ML  injection Inject 60 mg into the muscle every 14 (fourteen) days.     timolol  (TIMOPTIC -XR) 0.5 % ophthalmic gel-forming Place 1 drop into both eyes daily.     traMADol  (ULTRAM ) 50 MG tablet Take 50 mg by mouth 3 (three) times daily as needed for moderate pain. Maximum dose= 8 tablets per day For pain     urea 10 % lotion Apply 1 Application topically daily as needed for dry skin.     valACYclovir  (VALTREX ) 1000 MG tablet Take 1,000 mg by mouth daily.     amiodarone  (PACERONE ) 200 MG tablet Take 1 tablet (200 mg total) by mouth daily. 90 tablet 3   furosemide  (LASIX ) 20 MG tablet Take 1 tablet (20 mg total) by mouth daily. 90 tablet 3   No current facility-administered medications for this encounter.   Wt Readings from Last 3 Encounters:  07/16/23 105.6 kg (232 lb 12.8 oz)  06/28/23 106.6 kg (235 lb)  05/06/23 111.1 kg (245 lb)   BP 120/82   Pulse 78   Wt 105.6 kg (232 lb 12.8 oz)   SpO2 94%   BMI 32.47 kg/m  General:  Well appearing.  Neck: JVP difficult, thick neck Cor: Regular rate & rhythm. No rubs, gallops or murmurs. Lungs: clear Abdomen: soft, nontender, nondistended.  Extremities: no edema Neuro: alert & orientedx3. Affect pleasant  ECG: SR 77 bpm, 1st degree AVB   Assessment/Plan: 1. Chronic systolic CHF: Nonischemic cardiomyopathy.  Coronary CTA (7/23) with no significant CAD. Strong suspicion  for cardiac sarcoidosis.  Patient has tissue diagnosis of sarcoidosis from lung biopsy. He has h/o frequent PVCs/NSVT as well as decreased LV systolic function and LGE on cMRI. Cardiac MRI in 10/20 with EF 50%, diffuse basal segment subendocardial LGE, apical inferoseptal LGE => not in coronary distribution, concern for cardiac sarcoidosis. Echo [facility] in 6/23 with EF 25-30%.  Echo at [facility] in 6/23 was a technically difficult study. Cardiac PET in 8/23 showed EF 43%, no abnormal metabolism to suggest active sarcoidosis. cMRI (2/24) showed LVEF improved 50%.  Cardiac PET repeated in 11/24 showed EF 38%, no active inflammation. TEE in 01/25 EF 45-50% and severe MR with bileaflet prolapse and possible perforation of anterior mitral leaflet  - NYHA II. Volume okay on exam but 39% by ReDS. Wonder if he has some pulmonary edema contributing to cough. Increase lasix  to 20 mg daily. Check CMET, BNP today. Will need BMET 10 days.   - Continue Entresto  97/103 bid.  - Continue spironolactone 25 mg daily.  - Continue empagliflozin 12.5 mg daily.  - Continue Toprol  XL 75 mg at bedtime. - He has seen EP. With minimal LGE on last cMRI, felt ICD not indicated.  2. Sarcoidosis: Patient has biopsy-diagnosed sarcoidosis with involvement of lungs, hilar lymphadenopathy, liver, and eyes. Strongly suspect cardiac sarcoidosis with concerning LGE pattern on 2020 MRI and PVCs/NSVT.  Diagnosis was made in the 1990s. He has been on mycophenolate  in the past and is now on adalimumab (x 2 years).  He was started on prednisone  due to frequent PVCs and NSVT on monitoring for more aggressive treatment of cardiac sarcoidosis.  He is now off prednisone .  Cardiac PET at [facility] in 8/23 showed EF 43%, no abnormal metabolism to suggest active sarcoidosis, repeat cardiac PET in 11/24 again showed EF 38% with no active inflammation suggestive of active sarcoidosis. - Continue rheumatology followup for Humira.  - Now sees Dr.  Diania Fortes for pulmonary component of sarcoidosis. He is overdue  for follow-up, recommended he call for an appointment.   3. DVT: Spontaneous, x 2.   - Continue Xarelto.   4. 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  No PVCs on ECG today. - Continue amiodarone  200 mg daily.  Check LFTs and TSH. He will need regular eye exam.  5. Mitral regurgitation: Severe on TEE in January - Has been seen by Structural Heart. Plans for mTEER after his upcoming trip to [place].  6. Cough/congestion - Recurrent sinus infections and PNA. [facility] Response good response to last course of antibiotics and steroids in March. - Increased diuretics as above.   Follow up 2 months with Dr. Mitzie Anda  [facility], Heidi Llamas N  07/16/2023

## 2023-07-16 ENCOUNTER — Other Ambulatory Visit: Payer: Self-pay

## 2023-07-16 ENCOUNTER — Ambulatory Visit (HOSPITAL_COMMUNITY)
Admission: RE | Admit: 2023-07-16 | Discharge: 2023-07-16 | Disposition: A | Discharge status: Home / Self Care | Source: Ambulatory Visit | Attending: Physician Assistant | Admitting: Physician Assistant

## 2023-07-16 VITALS — BP 120/82 | HR 78 | Wt 232.8 lb

## 2023-07-16 DIAGNOSIS — Z86718 Personal history of other venous thrombosis and embolism: Secondary | ICD-10-CM | POA: Diagnosis not present

## 2023-07-16 DIAGNOSIS — I5022 Chronic systolic (congestive) heart failure: Secondary | ICD-10-CM

## 2023-07-16 DIAGNOSIS — I493 Ventricular premature depolarization: Secondary | ICD-10-CM

## 2023-07-16 DIAGNOSIS — N183 Chronic kidney disease, stage 3 unspecified: Secondary | ICD-10-CM | POA: Diagnosis not present

## 2023-07-16 DIAGNOSIS — D869 Sarcoidosis, unspecified: Secondary | ICD-10-CM

## 2023-07-16 DIAGNOSIS — I130 Hypertensive heart and chronic kidney disease with heart failure and stage 1 through stage 4 chronic kidney disease, or unspecified chronic kidney disease: Secondary | ICD-10-CM | POA: Insufficient documentation

## 2023-07-16 DIAGNOSIS — R590 Localized enlarged lymph nodes: Secondary | ICD-10-CM | POA: Diagnosis not present

## 2023-07-16 DIAGNOSIS — I340 Nonrheumatic mitral (valve) insufficiency: Secondary | ICD-10-CM | POA: Diagnosis not present

## 2023-07-16 DIAGNOSIS — D8689 Sarcoidosis of other sites: Secondary | ICD-10-CM | POA: Insufficient documentation

## 2023-07-16 DIAGNOSIS — I472 Ventricular tachycardia, unspecified: Secondary | ICD-10-CM | POA: Diagnosis not present

## 2023-07-16 DIAGNOSIS — Z7901 Long term (current) use of anticoagulants: Secondary | ICD-10-CM | POA: Diagnosis not present

## 2023-07-16 DIAGNOSIS — I428 Other cardiomyopathies: Secondary | ICD-10-CM

## 2023-07-16 DIAGNOSIS — Z79899 Other long term (current) drug therapy: Secondary | ICD-10-CM | POA: Insufficient documentation

## 2023-07-16 LAB — COMPREHENSIVE METABOLIC PANEL WITH GFR
ALT: 19 U/L (ref 0–44)
AST: 29 U/L (ref 15–41)
Albumin: 3.6 g/dL (ref 3.5–5.0)
Alkaline Phosphatase: 50 U/L (ref 38–126)
Anion gap: 14 (ref 5–15)
BUN: 21 mg/dL — ABNORMAL HIGH (ref 6–20)
CO2: 20 mmol/L — ABNORMAL LOW (ref 22–32)
Calcium: 9.2 mg/dL (ref 8.9–10.3)
Chloride: 104 mmol/L (ref 98–111)
Creatinine, Ser: 1.48 mg/dL — ABNORMAL HIGH (ref 0.61–1.24)
GFR, Estimated: 55 mL/min — ABNORMAL LOW (ref >60)
Glucose, Bld: 76 mg/dL (ref 70–99)
Potassium: 4.4 mmol/L (ref 3.5–5.1)
Sodium: 138 mmol/L (ref 135–145)
Total Bilirubin: 0.8 mg/dL (ref 0.0–1.2)
Total Protein: 7.1 g/dL (ref 6.5–8.1)

## 2023-07-16 LAB — BRAIN NATRIURETIC PEPTIDE: B Natriuretic Peptide: 43.7 pg/mL (ref 0.0–100.0)

## 2023-07-16 LAB — TSH: TSH: 3.981 u[IU]/mL (ref 0.350–4.500)

## 2023-07-16 MED ORDER — FUROSEMIDE 20 MG PO TABS: 20.0000 mg | ORAL_TABLET | Freq: Every day | ORAL | 3 refills | Status: DC

## 2023-07-16 MED ORDER — AMIODARONE HCL 200 MG PO TABS: 200.0000 mg | ORAL_TABLET | Freq: Every day | ORAL | 3 refills | Status: DC

## 2023-07-16 NOTE — Patient Instructions (Addendum)
 Great to see you today!!  Medication Changes:  Increase Furosemide  to 20 mg EVERY DAY  Lab Work:  Labs done today, your results will be available in MyChart, we will contact you for abnormal readings.  Your physician recommends that you return for lab work in: 1-2 weeks  Special Instructions // Education:  Do the following things EVERYDAY: Weigh yourself in the morning before breakfast. Write it down and keep it in a log. Take your medicines as prescribed Eat low salt foods--Limit salt (sodium) to 2000 mg per day.  Stay as active as you can everyday Limit all fluids for the day to less than 2 liters  Please follow-up with Pulmonary at the [facility]  Follow-Up in: 2 months   At the [facility], you and your health needs are our priority. We have a designated team specialized in the treatment of Heart Failure. This Care Team includes your primary Heart Failure Specialized Cardiologist (physician), Advanced Practice Providers (APPs- Physician Assistants and Nurse Practitioners), and Pharmacist who all work together to provide you with the care you need, when you need it.   You may see any of the following providers on your designated Care Team at your next follow up:  Dr. Jules Oar Dr. Peder Bourdon Dr. Alwin Baars Dr. Judyth Nunnery Nieves Bars, NP Ruddy Corral, [place] [facility] [place], [place] Dennise Fitz, NP [place] Lee, NP Luster Salters, PharmD   Please be sure to bring in all your medications bottles to every appointment.   Need to Contact Us :  If you have any questions or concerns before your next appointment please send us  a message through [place] or call our office at [phone].    TO LEAVE A MESSAGE FOR THE NURSE SELECT OPTION 2, PLEASE LEAVE A MESSAGE INCLUDING: YOUR NAME DATE OF BIRTH CALL BACK NUMBER REASON FOR CALL**this is important as we prioritize the call backs  YOU WILL RECEIVE A CALL BACK THE SAME DAY AS LONG AS YOU  CALL BEFORE 4:00 PM

## 2023-07-16 NOTE — Addendum Note (Signed)
 Encounter addended by: Arleene Belt, PA-C on: 07/16/2023 1:30 PM  Actions taken: Clinical Note Signed

## 2023-07-16 NOTE — Progress Notes (Signed)
 ReDS Vest / Clip - 07/16/23 1100       ReDS Vest / Clip   Station Marker D    Ruler Value 34    ReDS Value Range Moderate volume overload    ReDS Actual Value 39

## 2023-07-26 NOTE — Progress Notes (Signed)
 STS Score  Procedure Type: Isolated MVR Perioperative Outcome Estimate % Operative Mortality 1.77% Morbidity & Mortality 10% Stroke 0.937% Renal Failure 1.69% Reoperation 3.65% Prolonged Ventilation 5.09% Deep Sternal Wound Infection 0.11% Long Hospital Stay (>14 days) 4.87% Short Hospital Stay (<6 days)* 36.5%  _________________________   Procedure Type: Isolated MVr Perioperative Outcome Estimate % Operative Mortality 0.703% Morbidity & Mortality 5.66% Stroke 0.585% Renal Failure 0.906% Reoperation 2.13% Prolonged Ventilation 2.82% Deep Sternal Wound Infection 0.073% Long Hospital Stay (>14 days) 1.89% Short Hospital Stay (<6 days)* 65.4%

## 2023-07-27 ENCOUNTER — Other Ambulatory Visit (HOSPITAL_COMMUNITY)

## 2023-07-29 ENCOUNTER — Ambulatory Visit (HOSPITAL_COMMUNITY): Payer: Self-pay | Admitting: Physician Assistant

## 2023-07-29 ENCOUNTER — Ambulatory Visit (INDEPENDENT_AMBULATORY_CARE_PROVIDER_SITE_OTHER): Admitting: Cardiology

## 2023-07-29 ENCOUNTER — Ambulatory Visit
Admission: RE | Admit: 2023-07-29 | Discharge: 2023-07-29 | Disposition: A | Discharge status: Home / Self Care | Source: Ambulatory Visit | Attending: Cardiology | Admitting: Cardiology

## 2023-07-29 VITALS — BP 110/60 | HR 71 | Ht 71.0 in | Wt 230.0 lb

## 2023-07-29 DIAGNOSIS — I340 Nonrheumatic mitral (valve) insufficiency: Secondary | ICD-10-CM | POA: Diagnosis not present

## 2023-07-29 DIAGNOSIS — D8685 Sarcoid myocarditis: Secondary | ICD-10-CM | POA: Diagnosis not present

## 2023-07-29 DIAGNOSIS — I493 Ventricular premature depolarization: Secondary | ICD-10-CM

## 2023-07-29 DIAGNOSIS — I428 Other cardiomyopathies: Secondary | ICD-10-CM | POA: Insufficient documentation

## 2023-07-29 DIAGNOSIS — I5022 Chronic systolic (congestive) heart failure: Secondary | ICD-10-CM | POA: Diagnosis not present

## 2023-07-29 DIAGNOSIS — Z79899 Other long term (current) drug therapy: Secondary | ICD-10-CM

## 2023-07-29 LAB — BASIC METABOLIC PANEL WITH GFR
Anion gap: 9 (ref 5–15)
BUN: 26 mg/dL — ABNORMAL HIGH (ref 6–20)
CO2: 21 mmol/L — ABNORMAL LOW (ref 22–32)
Calcium: 9.4 mg/dL (ref 8.9–10.3)
Chloride: 105 mmol/L (ref 98–111)
Creatinine, Ser: 1.36 mg/dL — ABNORMAL HIGH (ref 0.61–1.24)
GFR, Estimated: >60 mL/min (ref >60)
Glucose, Bld: 89 mg/dL (ref 70–99)
Potassium: 4.5 mmol/L (ref 3.5–5.1)
Sodium: 135 mmol/L (ref 135–145)

## 2023-07-29 NOTE — Progress Notes (Signed)
  Electrophysiology Office Follow up Visit Note:    Date:  07/29/2023   ID:  Richard Shepherd, DOB Apr 18, 1965, MRN [id number]  PCP:  Clinic, [facility]  [facility] Cardiologist:  Kyra Phy, MD  [facility] Electrophysiologist:  Boyce Byes, MD    Interval History:     Richard Shepherd is a 58 y.o. male who presents for a follow up visit.   I last saw the patient May 13, 2018 for.  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  This has been complicated by frequent PVCs and NSVT.  He is on amiodarone .  Since I last saw the patient he has been evaluated by the structural heart team and is planning for mTEER.  He is here with wife today.  He has been doing okay.  He is planning to go on vacation soon and that when he gets back he is planning to have the mitral valve procedure.      Past medical, surgical, social and family history were reviewed.  ROS:   Please see the history of present illness.    All other systems reviewed and are negative.  EKGs/Labs/Other Studies Reviewed:    The following studies were reviewed today:  May blood work reviewed        Physical Exam:    VS:  BP 110/60 (BP Location: Left Arm, Patient Position: Sitting, Cuff Size: Normal)   Pulse 71   Ht 5\' 11"  (1.803 m)   Wt 230 lb (104.3 kg)   SpO2 97%   BMI 32.08 kg/m     Wt Readings from Last 3 Encounters:  07/29/23 230 lb (104.3 kg)  07/16/23 232 lb 12.8 oz (105.6 kg)  06/28/23 235 lb (106.6 kg)     GEN: no distress CARD: RRR, No MRG RESP: No IWOB. CTAB.      ASSESSMENT:    1. Chronic systolic heart failure (HCC)   2. NICM (nonischemic cardiomyopathy) (HCC)   3. PVC's (premature ventricular contractions)    PLAN:    In order of problems listed above:  #Chronic systolic heart failure #Nonischemic cardiomyopathy NYHA class II-III.  Warm and dry on exam today.  Planning for mitral valve clip soon.  Continue metoprolol , Lasix ,  Jardiance, Entresto .  #Frequent PVCs Continue amiodarone  Recent blood work okay for continued amiodarone  use.  Follow-up 6 months with EP APP.   Signed, Harvie Liner, MD, [facility], [facility] 07/29/2023 4:45 PM    Electrophysiology [facility]

## 2023-07-29 NOTE — Patient Instructions (Signed)
 Medication Instructions:  Your physician recommends that you continue on your current medications as directed. Please refer to the Current Medication list given to you today.  *If you need a refill on your cardiac medications before your next appointment, please call your pharmacy*  Follow-Up: At [facility], you and your health needs are our priority.  As part of our continuing mission to provide you with exceptional heart care, our providers are all part of one team.  This team includes your primary Cardiologist (physician) and Advanced Practice Providers or APPs (Physician Assistants and Nurse Practitioners) who all work together to provide you with the care you need, when you need it.  Your next appointment:   6 months  Provider:   You will see one of the following Advanced Practice Providers on your designated Care Team:   Mertha Abrahams, [place]" [place], PA-C Suzann Riddle, NP

## 2023-08-12 ENCOUNTER — Telehealth: Payer: Self-pay

## 2023-08-12 NOTE — Telephone Encounter (Signed)
 The patient had previously planned for mTEER (PASCAL) in June 2025. While the patient was on vacation out of the country, his case was reviewed again in Structural Heart Team meeting. 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 It was decided the patient should be sent to a tertiary center for a surgical opinion.  Left message to call back.

## 2023-08-16 NOTE — Telephone Encounter (Signed)
 Left message to call back

## 2023-08-17 NOTE — Telephone Encounter (Signed)
 Spoke with the patient at length.  He understood that while his anatomy is amenable to PASCAL, he may be better suited with surgery.  Offered to make a surgical referral to an OSH since Dr. Honey Lusty is now retired, but the patient declined at this time.  He stated he wants to speak with his wife first, but will want a referral to [facility].  He will call later this week.

## 2023-08-24 NOTE — Telephone Encounter (Signed)
 The patient called while at a medical visit with Dr. Drenda Gentle. Spoke with Dr. Tan and the patient wishes for referral to [facility] - Dr. Rawland Caddy.   All imaging shared to [facility]. Referral sent to [facility] at [facility].

## 2023-08-26 NOTE — Telephone Encounter (Signed)
 The patient is scheduled with Dr. Rawland Caddy 09/02/2023.

## 2023-09-17 ENCOUNTER — Telehealth (HOSPITAL_COMMUNITY): Payer: Self-pay | Admitting: Cardiology

## 2023-09-17 NOTE — Telephone Encounter (Signed)
 Called to confirm/remind patient of their appointment at the [facility] on 09/17/2023.   Appointment:   [] Confirmed  [x] Left mess   [] No answer/No voice mail  [] VM Full/unable to leave message  [] Phone not in service  Patient reminded to bring all medications and/or complete list.  Confirmed patient has transportation. Gave directions, instructed to utilize valet parking.

## 2023-09-20 ENCOUNTER — Encounter (HOSPITAL_COMMUNITY): Payer: Self-pay | Admitting: Cardiology

## 2023-09-20 ENCOUNTER — Ambulatory Visit (HOSPITAL_COMMUNITY): Payer: Self-pay | Admitting: Cardiology

## 2023-09-20 ENCOUNTER — Ambulatory Visit (HOSPITAL_COMMUNITY)
Admission: RE | Admit: 2023-09-20 | Discharge: 2023-09-20 | Disposition: A | Discharge status: Home / Self Care | Source: Ambulatory Visit | Attending: Cardiology | Admitting: Cardiology

## 2023-09-20 VITALS — BP 110/74 | HR 77 | Ht 71.0 in | Wt 230.6 lb

## 2023-09-20 DIAGNOSIS — D8689 Sarcoidosis of other sites: Secondary | ICD-10-CM | POA: Insufficient documentation

## 2023-09-20 DIAGNOSIS — I130 Hypertensive heart and chronic kidney disease with heart failure and stage 1 through stage 4 chronic kidney disease, or unspecified chronic kidney disease: Secondary | ICD-10-CM | POA: Diagnosis not present

## 2023-09-20 DIAGNOSIS — I428 Other cardiomyopathies: Secondary | ICD-10-CM | POA: Insufficient documentation

## 2023-09-20 DIAGNOSIS — R011 Cardiac murmur, unspecified: Secondary | ICD-10-CM | POA: Diagnosis not present

## 2023-09-20 DIAGNOSIS — I340 Nonrheumatic mitral (valve) insufficiency: Secondary | ICD-10-CM | POA: Insufficient documentation

## 2023-09-20 DIAGNOSIS — I472 Ventricular tachycardia, unspecified: Secondary | ICD-10-CM | POA: Insufficient documentation

## 2023-09-20 DIAGNOSIS — R590 Localized enlarged lymph nodes: Secondary | ICD-10-CM | POA: Insufficient documentation

## 2023-09-20 DIAGNOSIS — Z87891 Personal history of nicotine dependence: Secondary | ICD-10-CM | POA: Insufficient documentation

## 2023-09-20 DIAGNOSIS — I341 Nonrheumatic mitral (valve) prolapse: Secondary | ICD-10-CM | POA: Insufficient documentation

## 2023-09-20 DIAGNOSIS — Z7901 Long term (current) use of anticoagulants: Secondary | ICD-10-CM | POA: Insufficient documentation

## 2023-09-20 DIAGNOSIS — Z79899 Other long term (current) drug therapy: Secondary | ICD-10-CM | POA: Diagnosis not present

## 2023-09-20 DIAGNOSIS — N183 Chronic kidney disease, stage 3 unspecified: Secondary | ICD-10-CM | POA: Insufficient documentation

## 2023-09-20 DIAGNOSIS — D8685 Sarcoid myocarditis: Secondary | ICD-10-CM | POA: Insufficient documentation

## 2023-09-20 DIAGNOSIS — I493 Ventricular premature depolarization: Secondary | ICD-10-CM | POA: Insufficient documentation

## 2023-09-20 DIAGNOSIS — Z86718 Personal history of other venous thrombosis and embolism: Secondary | ICD-10-CM | POA: Diagnosis not present

## 2023-09-20 DIAGNOSIS — I5022 Chronic systolic (congestive) heart failure: Secondary | ICD-10-CM | POA: Diagnosis not present

## 2023-09-20 LAB — COMPREHENSIVE METABOLIC PANEL WITH GFR
ALT: 20 U/L (ref 0–44)
AST: 28 U/L (ref 15–41)
Albumin: 3.6 g/dL (ref 3.5–5.0)
Alkaline Phosphatase: 61 U/L (ref 38–126)
Anion gap: 9 (ref 5–15)
BUN: 18 mg/dL (ref 6–20)
CO2: 26 mmol/L (ref 22–32)
Calcium: 9.5 mg/dL (ref 8.9–10.3)
Chloride: 103 mmol/L (ref 98–111)
Creatinine, Ser: 1.53 mg/dL — ABNORMAL HIGH (ref 0.61–1.24)
GFR, Estimated: 53 mL/min — ABNORMAL LOW (ref >60)
Glucose, Bld: 81 mg/dL (ref 70–99)
Potassium: 4.3 mmol/L (ref 3.5–5.1)
Sodium: 138 mmol/L (ref 135–145)
Total Bilirubin: 0.7 mg/dL (ref 0.0–1.2)
Total Protein: 7.5 g/dL (ref 6.5–8.1)

## 2023-09-20 LAB — CBC
HCT: 48.3 % (ref 39.0–52.0)
Hemoglobin: 16.7 g/dL (ref 13.0–17.0)
MCH: 33.5 pg (ref 26.0–34.0)
MCHC: 34.6 g/dL (ref 30.0–36.0)
MCV: 96.8 fL (ref 80.0–100.0)
Platelets: 245 K/uL (ref 150–400)
RBC: 4.99 MIL/uL (ref 4.22–5.81)
RDW: 13.2 % (ref 11.5–15.5)
WBC: 5.1 K/uL (ref 4.0–10.5)
nRBC: 0 % (ref 0.0–0.2)

## 2023-09-20 LAB — BRAIN NATRIURETIC PEPTIDE: B Natriuretic Peptide: 26.6 pg/mL (ref 0.0–100.0)

## 2023-09-20 LAB — TSH: TSH: 2.861 u[IU]/mL (ref 0.350–4.500)

## 2023-09-20 MED ORDER — FUROSEMIDE 20 MG PO TABS: ORAL_TABLET | ORAL | 3 refills | Status: AC

## 2023-09-20 NOTE — Patient Instructions (Signed)
 CHANGE Lasix  to 40 mg alternating with 20 mg daily.  Labs done today, your results will be available in MyChart, we will contact you for abnormal readings.  Repeat blood work in 10 days.  Your physician has requested that you have an echocardiogram. Echocardiography is a painless test that uses sound waves to create images of your heart. It provides your doctor with information about the size and shape of your heart and how well your heart's chambers and valves are working. This procedure takes approximately one hour. There are no restrictions for this procedure. Please do NOT wear cologne, perfume, aftershave, or lotions (deodorant is allowed). Please arrive 15 minutes prior to your appointment time.  Please note: We ask at that you not bring children with you during ultrasound (echo/ vascular) testing. Due to room size and safety concerns, children are not allowed in the ultrasound rooms during exams. Our front office staff cannot provide observation of children in our lobby area while testing is being conducted. An adult accompanying a patient to their appointment will only be allowed in the ultrasound room at the discretion of the ultrasound technician under special circumstances. We apologize for any inconvenience.  Your physician recommends that you schedule a follow-up appointment in: 2 months.  If you have any questions or concerns before your next appointment please send us  a message through [place] or call our office at [phone].    TO LEAVE A MESSAGE FOR THE NURSE SELECT OPTION 2, PLEASE LEAVE A MESSAGE INCLUDING: YOUR NAME DATE OF BIRTH CALL BACK NUMBER REASON FOR CALL**this is important as we prioritize the call backs  YOU WILL RECEIVE A CALL BACK THE SAME DAY AS LONG AS YOU CALL BEFORE 4:00 PM  At the [facility], you and your health needs are our priority. As part of our continuing mission to provide you with exceptional heart care, we have created  designated Provider Care Teams. These Care Teams include your primary Cardiologist (physician) and Advanced Practice Providers (APPs- Physician Assistants and Nurse Practitioners) who all work together to provide you with the care you need, when you need it.   You may see any of the following providers on your designated Care Team at your next follow up: Dr Toribio Fuel Dr Ezra Shuck Dr. Ria Commander Dr. Morene Brownie Amy Lenetta, NP Caffie Shed, [place] [facility] [place], [place] Beckey Coe, NP [place] Lee, NP Ellouise Class, NP Tinnie Redman, PharmD Jaun Bash, PharmD   Please be sure to bring in all your medications bottles to every appointment.    Thank you for choosing [facility]

## 2023-09-20 NOTE — Progress Notes (Signed)
 PCP: [facility] Cardiology: Dr. Wendel EP: Dr. Cindie HF Cardiology: Dr. Rolan  Chief complaint: CHF  58 y.o. 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  Sarcoidosis was diagnosed in 1990s by lung biopsy.  He has no family history of sarcoidosis, it was thought to possibly be triggered by exposures during the [place] War (patient served in the army).  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 Back in 10/20, cardiac MRI was concerning for CS => subendocardial diffuse basal LGE not in a coronary distribution.  Echo in 10/21 showed EF 40-45%.  Most recent echo at [facility] was a difficult study, EF reported as 50-55% but likely worse.  Echo in 6/23 at [facility] showed EF 25-30%. Patient had a Zio monitor in 5/23 showing 19% PVCs.  He was seen by Dr. Wendel who was concerned for active cardiac sarcoidosis.  Coronary CTA was done in 7/23 showed no significant CAD. Patient was started on po steroids by Dr. Wendel and was referred to Dr. Cindie for EP evaluation.  Patient has been on adalimumab through his rheumatologist at the [facility] for 2 years. Prior to this, he was on mycophenolate .  He says that he has not taken methotrexate.   Cardiac PET in 8/23 showed EF 43%, no abnormal metabolism to suggest active sarcoidosis.   Zio 2 day (10/23) Mostly NSR, 1.5 % PVC burden.  cMRI (2/24) showed LVEF 50% with severe dilation, RVEF  51%, mitral valve with evidence of prior endocarditis with mild MR, basal LGE (resolution of prior apical LGE).  Repeat cardiac PET in 11/24 showed EF 38%, no active inflammation.   TEE (1/25) showed EF 45-50%, mild LV dilation,  normal RV, posterior mitral leaflet prolapse but also possible anterior leaflet perforation with a posteriorly-directed MR jet, MR appeared severe with PISA ERO 0.42 cm^2 and 3-D vena contracta area 0.58.    LHC/RHC (2/25) showed normal coronaries; mean RA 8, PA 30/15, mean PCWP 15, CI 2.02.   He was seen in structural heart clinic with discussion between Dr. Wendel and [facility]CTS regarding Mitraclip vs surgery for his mitral valve.  Ultimately, it appears that he was referred by someone from the [place] to a cardiac surgeon at [facility] who is planning to do what sounds like a minimally invasive mitral valve repair.   Today he returns for followup of CHF, mitral regurgitation, and cardiac sarcoidosis. He continues on Humira through his rheumatologist (at the [facility]). He got back recently from a trip to [place].  Weight is down 2 lbs since last appointment.  He has occasional coughing that is worse when he lies down.  He denies dyspnea with usual activities.  No chest pain.  No lightheadedness. No palpitations.    ECG (personally reviewed): NSR, 1st degree AVB, right axis deviation  Labs (6/23): K 4.7, creatinine 1.5 Labs (7/23): hgb 18.7 Labs (3/24): hgb 15.8, TSH mildly elevated, normal T4 Labs (8/24): BNP 44, K 4.7, creatinine 1.55 Labs (5/25): K 4.5, creatinine 1.36, BNP 44, TSH normal, LFTs normal  PMH: 1. HTN 2. CKD stage 3 3. DVT: Spontaneous, 2007 and again in 2008.  4. OSA 5. 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  Mitral regurgitation:  H/o mitral valve endocarditis in 2018, Strep bacteremia.  - TEE in 1/25 with posterior mitral leaflet prolapse but also possible anterior leaflet perforation with a posteriorly-directed MR jet, MR appeared severe with PISA ERO 0.42 cm^2 and 3-D vena contracta area 0.58. 7. Erectile dysfunction 8. Sarcoidosis: Ocular, pulmonary, cardiac, and hepatic.  Diagnosed in 1990s via lung biopsy.  9. NSVT/PVCs: Zio monitor (5/23) with 19% PVCs and 1 run NSVT.  - Zio  monitor (8/23): 13.7% PVCs - Zio 2 day (10/23): Mostly NSR, 1.5 % PVC burden. 10. Chronic systolic CHF: Thought to be due to cardiac sarcoidosis.  - Coronary CTA (7/23): Calcium  score 0, normal coronaries.  - Echo (10/21): EF 40-45% - cMRI (10/20): EF 50%, diffuse basal segment subendocardial LGE, apical inferoseptal LGE => not in coronary distribution, concern for cardiac sarcoidosis.  - Echo (6/23, [facility]): EF 50-55%, mild LVH, RV normal (difficult images, EF probably worse than reported).  - Echo (6/23, [facility] hospital): EF 25-30%, akinesis basal anteroseptal wall.  - Cardiac PET (8/23): EF 43%, no abnormal metabolism to suggest active sarcoidosis. - cMRI (2/24):  LVEF 50% with severe dilation, RVEF  51%, mitral valve with evidence of prior endocarditis with mild MR, basal inferolateral patchy LGE (resolution of prior apical LGE). - Cardiac PET (11/24): EF 38%, no active inflammation.  - TEE (1/25): EF 45-50%, mild LV dilation, normal RV, posterior mitral leaflet prolapse but also possible anterior leaflet perforation with a posteriorly-directed MR jet, MR appeared severe with PISA ERO 0.42 cm^2 and 3-D vena contracta area 0.58.  - LHC/RHC (2/25) showed normal coronaries; mean RA 8, PA 30/15, mean PCWP 15, CI 2.02.   Social History   Socioeconomic History   Marital status: Married    Spouse name: Not on file   Number of children: Not on file   Years of education: Not on file   Highest education level: Not on file  Occupational History   Not on file  Tobacco Use   Smoking status: Former    Current packs/day: 0.12    Average packs/day: 0.1 packs/day for 4.0 years (0.5 ttl pk-yrs)    Types: Cigarettes   Smokeless tobacco: Never   Tobacco comments:    08/21/2017 stopped in the 1990s  Vaping Use   Vaping status: Never Used  Substance and Sexual Activity   Alcohol  use: Yes    Comment: 08/31/2017 might have a margarita once/month; if that   Drug use: Never   Sexual activity: Yes   Other Topics Concern   Not on file  Social History Narrative   Not on file   Social Drivers of Health   Financial Resource Strain: Not on file  Food Insecurity: Not on file  Transportation Needs: Not on file  Physical Activity: Not on file  Stress: Not on file  Social Connections: Not on file  Intimate Partner Violence: Not on file   Family History  Problem Relation Age of Onset   Breast cancer Mother    Multiple sclerosis Father    Multiple sclerosis Brother    Diabetes Brother    ROS: All systems reviewed and negative except as per HPI.   Current Outpatient Medications  Medication Sig Dispense Refill   acetaminophen  (TYLENOL ) 500 MG tablet Take 1,000 mg by mouth 4 (four) times daily as needed for mild pain.     adalimumab (HUMIRA, 2 PEN,) 40 MG/0.4ML pen Inject 40 mg into the skin every 14 (fourteen) days.     albuterol  (PROVENTIL   HFA;VENTOLIN  HFA) 108 (90 Base) MCG/ACT inhaler Inhale 1-2 puffs into the lungs every 6 (six) hours as needed for wheezing or shortness of breath.     amiodarone  (PACERONE ) 200 MG tablet Take 1 tablet (200 mg total) by mouth daily. 90 tablet 3   ascorbic acid (VITAMIN C) 500 MG tablet Take 500 mg by mouth daily.     Calcium  Carbonate-Vit D-Min (CALCIUM  1200) 1200-1000 MG-UNIT CHEW Chew 1,200 mg by mouth every morning. 30 tablet 5   capsaicin  (ZOSTRIX) 0.025 % cream Apply 1 application topically 2 (two) times daily as needed (skin care).      carboxymethylcellulose (REFRESH PLUS) 0.5 % SOLN Place 1 drop into both eyes 2 (two) times daily.     Cholecalciferol (VITAMIN D3) 50 MCG (2000 UT) capsule Take 2,000 Units by mouth daily.     cyclobenzaprine  (FLEXERIL ) 10 MG tablet Take 10 mg by mouth every 8 (eight) hours as needed for muscle spasms.     cycloSPORINE (RESTASIS) 0.05 % ophthalmic emulsion Place 1 drop into both eyes 2 (two) times daily.     diclofenac Sodium (VOLTAREN) 1 % GEL Apply 2 g topically 2 (two) times daily as needed (pain).      diphenhydrAMINE  (BENADRYL ) 50 MG capsule Take 50 mg by mouth at bedtime.     doxycycline (VIBRA-TABS) 100 MG tablet Take 1 tablet by mouth 2 (two) times daily.     empagliflozin (JARDIANCE) 25 MG TABS tablet Take 12.5 mg by mouth daily.     gabapentin  (NEURONTIN ) 400 MG capsule Take 400-800 mg by mouth See admin instructions. 400 mg during the day, 800 mg at bedtime     guaiFENesin (MUCINEX) 600 MG 12 hr tablet Take 1 tablet by mouth every 12 (twelve) hours.     Ipratropium-Albuterol  (COMBIVENT) 20-100 MCG/ACT AERS respimat Inhale 1 puff into the lungs as needed.     metoprolol  succinate (TOPROL  XL) 50 MG 24 hr tablet Take 1.5 tablets (75 mg total) by mouth at bedtime. 90 tablet 3   Multiple Vitamins-Minerals (MULTIVITAMIN GUMMIES ADULT PO) Take 2 each by mouth daily.     nepafenac (ILEVRO) 0.3 % ophthalmic suspension 1 drop daily.     omeprazole  (PRILOSEC) 20 MG capsule Take 1 capsule (20 mg total) by mouth daily. 175 capsule 0   prednisoLONE  acetate (PRED FORTE ) 1 % ophthalmic suspension Place 1 drop into both eyes 2 (two) times daily.     rivaroxaban (XARELTO) 20 MG TABS tablet Take 20 mg by mouth daily.     sacubitril-valsartan (ENTRESTO ) 97-103 MG Take 1 tablet by mouth 2 (two) times daily.     Semaglutide,0.25 or 0.5MG /DOS, 2 MG/3ML SOPN Inject into the skin. TAKES EVERY MONDAY     sildenafil (VIAGRA) 100 MG tablet Take 50 mg by mouth daily as needed for erectile dysfunction.     spironolactone (ALDACTONE) 25 MG tablet Take 25 mg by mouth daily.     testosterone cypionate (DEPOTESTOSTERONE CYPIONATE) 200 MG/ML injection Inject 60 mg into the muscle every 14 (fourteen) days.     timolol  (TIMOPTIC -XR) 0.5 % ophthalmic gel-forming Place 1 drop into both eyes daily.     traMADol  (ULTRAM ) 50 MG tablet Take 50 mg by mouth 3 (three) times daily as needed for moderate pain. Maximum dose= 8 tablets per day For pain     urea 10 % lotion Apply 1 Application topically daily as needed for dry skin.      valACYclovir  (VALTREX ) 1000 MG tablet Take 1,000 mg by  mouth daily.     furosemide  (LASIX ) 20 MG tablet 40 mg daily, alternating with 20 mg daily. 180 tablet 3   No current facility-administered medications for this encounter.   Wt Readings from Last 3 Encounters:  09/20/23 104.6 kg (230 lb 9.6 oz)  07/29/23 104.3 kg (230 lb)  07/16/23 105.6 kg (232 lb 12.8 oz)   BP 110/74   Pulse 77   Ht 5' 11 (1.803 m)   Wt 104.6 kg (230 lb 9.6 oz)   SpO2 95%   BMI 32.16 kg/m  General: NAD Neck: JVP 8 cm, no thyromegaly or thyroid  nodule.  Lungs: Clear to auscultation bilaterally with normal respiratory effort. CV: Nondisplaced PMI.  Heart regular S1/S2, no S3/S4, 2/6 HSM apex.  Trace ankle edema.  No carotid bruit.  Normal pedal pulses.  Abdomen: Soft, nontender, no hepatosplenomegaly, no distention.  Skin: Intact without lesions or rashes.  Neurologic: Alert and oriented x 3.  Psych: Normal affect. Extremities: No clubbing or cyanosis.  HEENT: Normal.   Assessment/Plan: 1. Chronic systolic CHF: Nonischemic cardiomyopathy.  Coronary CTA (7/23) with no significant CAD. Strong suspicion for cardiac sarcoidosis.  Patient has tissue diagnosis of sarcoidosis from lung biopsy. He has h/o frequent PVCs/NSVT as well as decreased LV systolic function and LGE on cMRI. Cardiac MRI in 10/20 with EF 50%, diffuse basal segment subendocardial LGE, apical inferoseptal LGE => not in coronary distribution, concern for cardiac sarcoidosis. Echo from [facility] in 6/23 with EF 25-30%.  Cardiac PET in 8/23 showed EF 43%, no abnormal metabolism to suggest active sarcoidosis. cMRI (2/24) showed LVEF improved 50%, basal inferolateral patchy LGE.  Cardiac PET repeated in 11/24 showed EF 38%, no active inflammation.  TEE (1/25) showed EF 45-50%, mild LV dilation, normal RV, posterior mitral leaflet prolapse but also possible anterior leaflet perforation with a posteriorly-directed MR jet, MR appeared severe with PISA  ERO 0.42 cm^2 and 3-D vena contracta area 0.58.  LHC/RHC (2/25) showed normal coronaries; mean RA 8, PA 30/15, mean PCWP 15, CI 2.02. NYHA class II, I suspect the cough when lying down is orthopnea.  Mild volume overload on exam.  - Increase Lasix  to 40 mg daily alternating with 20 mg daily. BMET/BNP today, BMET in 10 days.   - Continue Entresto  97/103 bid.  - Continue spironolactone 25 mg daily.  - Continue empagliflozin 12.5 mg daily.  - Continue Toprol  XL 75 mg at bedtime. - He needs correction of mitral regurgitation, see below.  - He has seen EP. With minimal LGE and improved EF on last cMRI in 2/24, felt ICD not indicated. - I will arrange for repeat echo.  2. Sarcoidosis: Patient has biopsy-diagnosed sarcoidosis with involvement of lungs, hilar lymphadenopathy, liver, and eyes. Strongly suspect cardiac sarcoidosis with concerning LGE pattern on 2020 MRI and PVCs/NSVT.  Diagnosis was made in the 1990s. He has been on mycophenolate  in the past and is now on adalimumab (x 2 years).  He was started on prednisone  due to frequent PVCs and NSVT on monitoring for more aggressive treatment of cardiac sarcoidosis.  He is now off prednisone .  Cardiac PET at [facility] in 8/23 showed EF 43%, no abnormal metabolism to suggest active sarcoidosis, repeat cardiac PET in 11/24 showed EF 38% with no active inflammation suggestive of active sarcoidosis. - Continue rheumatology followup for Humira.  - He is now followed by Dr. Kara for pulmonary component of sarcoidosis. - I will arrange for repeat cardiac PET in 11/25.  3. DVT: Spontaneous, x  2.   - Continue Xarelto.  4. 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  He has been started on amiodarone .  - Continue amiodarone  200 mg daily.  Check LFTs and TSH. He will need regular eye exam. 5. Mitral regurgitation: Patient has posterior MV prolapse, but suspect his severe MR is from prior endocarditis (episode of Strep endocarditis in 2018). TEE in 1/25 showed  posterior mitral leaflet prolapse but also possible anterior leaflet perforation with a posteriorly-directed MR jet, MR appeared severe with PISA ERO 0.42 cm^2 and 3-D vena contracta area 0.58 cm^2. Prominent murmur.  EF has improved over time, 45-50% by TEE in 1/25.  I think he would be a reasonable surgical candidate for MV repair.  - Patient has appointment with a cardiac surgeon at [facility] later this month and hopefully will be proceeding with minimally invasive mitral valve repair.  He will let me know when surgery is scheduled.  Of note, no significant CAD on 2/25 cath.   Follow up in 2 months with APP.   I spent 31 minutes reviewing data, interviewing patient, and organizing the orders/followup.   Ezra Shuck  09/20/2023

## 2023-09-30 ENCOUNTER — Ambulatory Visit (HOSPITAL_COMMUNITY)
Admission: RE | Admit: 2023-09-30 | Discharge: 2023-09-30 | Disposition: A | Discharge status: Home / Self Care | Source: Ambulatory Visit | Attending: Cardiology | Admitting: Cardiology

## 2023-09-30 DIAGNOSIS — I5022 Chronic systolic (congestive) heart failure: Secondary | ICD-10-CM | POA: Diagnosis present

## 2023-09-30 LAB — BASIC METABOLIC PANEL WITH GFR
Anion gap: 9 (ref 5–15)
BUN: 17 mg/dL (ref 6–20)
CO2: 25 mmol/L (ref 22–32)
Calcium: 9.5 mg/dL (ref 8.9–10.3)
Chloride: 104 mmol/L (ref 98–111)
Creatinine, Ser: 1.71 mg/dL — ABNORMAL HIGH (ref 0.61–1.24)
GFR, Estimated: 46 mL/min — ABNORMAL LOW (ref >60)
Glucose, Bld: 93 mg/dL (ref 70–99)
Potassium: 4 mmol/L (ref 3.5–5.1)
Sodium: 138 mmol/L (ref 135–145)

## 2023-10-05 ENCOUNTER — Telehealth: Payer: Self-pay | Admitting: *Deleted

## 2023-10-05 NOTE — Telephone Encounter (Signed)
 Copied from CRM [id number]. Topic: Clinical - Request for Lab/Test Order >> Oct 05, 2023  3:20 PM Richard Shepherd wrote: Reason for CRM: Patient is calling to inquire about missed call for a CT scan. States has sarcoidosis and missed a call to schedule a CT No order present in chart at this time. Please follow up with patient.   I called and spoke with the pt  He states had recent pna, seen by PCP  His PCP rec that he go ahead and f/u with pulm  He's stable now and not having any symptoms  I have scheduled him with Candis at [facility] which was the soonest available  Nothing further needed at this time

## 2023-10-06 ENCOUNTER — Ambulatory Visit (HOSPITAL_COMMUNITY)
Admission: RE | Admit: 2023-10-06 | Discharge: 2023-10-06 | Disposition: A | Discharge status: Home / Self Care | Source: Ambulatory Visit | Attending: Cardiology | Admitting: Cardiology

## 2023-10-06 DIAGNOSIS — I341 Nonrheumatic mitral (valve) prolapse: Secondary | ICD-10-CM | POA: Insufficient documentation

## 2023-10-06 DIAGNOSIS — G4730 Sleep apnea, unspecified: Secondary | ICD-10-CM | POA: Insufficient documentation

## 2023-10-06 DIAGNOSIS — D869 Sarcoidosis, unspecified: Secondary | ICD-10-CM | POA: Diagnosis not present

## 2023-10-06 DIAGNOSIS — I5022 Chronic systolic (congestive) heart failure: Secondary | ICD-10-CM | POA: Insufficient documentation

## 2023-10-06 DIAGNOSIS — N189 Chronic kidney disease, unspecified: Secondary | ICD-10-CM | POA: Diagnosis not present

## 2023-10-06 DIAGNOSIS — E785 Hyperlipidemia, unspecified: Secondary | ICD-10-CM | POA: Diagnosis not present

## 2023-10-06 DIAGNOSIS — I340 Nonrheumatic mitral (valve) insufficiency: Secondary | ICD-10-CM | POA: Diagnosis not present

## 2023-10-06 LAB — ECHOCARDIOGRAM COMPLETE
Area-P 1/2: 4.49 cm2
Est EF: 50
MV M vel: 5.56 m/s
MV Peak grad: 123.7 mmHg
Radius: 0.8 cm
Single Plane A2C EF: 50.8 %

## 2023-10-06 NOTE — Progress Notes (Signed)
  Echocardiogram 2D Echocardiogram has been performed.  LAMON MAXWELL 10/06/2023, 11:41 AM

## 2023-10-18 ENCOUNTER — Encounter (HOSPITAL_BASED_OUTPATIENT_CLINIC_OR_DEPARTMENT_OTHER): Payer: Self-pay

## 2023-10-18 ENCOUNTER — Ambulatory Visit (HOSPITAL_BASED_OUTPATIENT_CLINIC_OR_DEPARTMENT_OTHER)

## 2023-10-18 VITALS — BP 111/72 | HR 76 | Ht 71.0 in | Wt 230.0 lb

## 2023-10-18 DIAGNOSIS — J4520 Mild intermittent asthma, uncomplicated: Secondary | ICD-10-CM | POA: Diagnosis not present

## 2023-10-18 DIAGNOSIS — D869 Sarcoidosis, unspecified: Secondary | ICD-10-CM | POA: Diagnosis not present

## 2023-10-18 DIAGNOSIS — G4733 Obstructive sleep apnea (adult) (pediatric): Secondary | ICD-10-CM

## 2023-10-18 MED ORDER — BUDESONIDE-FORMOTEROL FUMARATE 160-4.5 MCG/ACT IN AERO: 2.0000 | INHALATION_SPRAY | Freq: Two times a day (BID) | RESPIRATORY_TRACT | 6 refills | Status: DC

## 2023-10-18 NOTE — Assessment & Plan Note (Signed)
-   Resume use of CPAP nightly; follow-up for compliance download -  Advised importance of continuing with CPAP nightly -  Advised not to drive while sleepy

## 2023-10-18 NOTE — Patient Instructions (Signed)
 Start Symbicort  2 puffs inhaled twice daily.  Continue Albuterol  inhaler as needed for shortness of breath.  Pulmonary function tests as ordered; follow up in clinic afterwards.  Resume use of CPAP nightly; follow up for compliance download.

## 2023-10-18 NOTE — Assessment & Plan Note (Signed)
-   Start Symbicort  2 puffs inhaled twice a day - Continue albuterol  as needed for dyspnea - Plan for pulmonary function testing as his last one was completed in 2021. - May need risk stratification at next appointment if mitral valve replacement is planned in the near future

## 2023-10-18 NOTE — Progress Notes (Signed)
 @Patient  ID: Richard Shepherd, male    DOB: Jan 16, 1966, 58 y.o.   MRN: [id number]  Chief Complaint  Patient presents with   Follow-up    Referring provider: Clinic, [facility]  HPI: Richard Shepherd is a 58 y/o male with PMH of asthma, OSA, sarcoidosis, CKD, RA, mitral valve insufficiency, and DVT who presents today for follow up.  His last visit in our clinic was in March 2024.  He reports that he had a bout of pneumonia in December 2024.  He required multiple rounds of antibiotics and steroids to help clear this.  Due to issues with productive cough around this time he stopped using his CPAP.  He has not used it since December.  Compliance download was not reviewed today as there is no data.  He states that since his bout of pneumonia in December he has had a cough off and on which is sometimes worse at night.  It is sometimes productive of clear to white sputum.  He also reports some shortness of breath with exertion.  He was supposed to have a mitral valve repair in January however this was put on hold due to pneumonia.  He currently reports coughing at night as well as fatigue.  He denies fever, chills, headache, dizziness, chest pain.  He does report a 30 pound intentional weight loss over the last year and a half.  No other complaints reported.  TEST/EVENTS : Echo completed 10/06/2023:Mitral regurgitation is noted to be severe.  Ejection fraction is 50%.  Normal pulmonary artery pressure noted. Cardiac catheterization completed 04/12/2023:1. Borderline elevated RA pressure and PCWP.  2. Low but not markedly low cardiac output.  3. Normal coronaries.      Allergies  Allergen Reactions   Other Other (See Comments)    Dogs and Cats   Humira (2 Pen) [Adalimumab] Rash    Generic causes skin rash      There is no immunization history on file for this patient.  Past Medical History:  Diagnosis Date   Arthritis    all over (08/31/2017)   Asthma    Chest pain    Chronic lower  back pain    CKD (chronic kidney disease), stage III (HCC)    thelbert 08/31/2017   DVT (deep venous thrombosis) (HCC) < & 06/2006   a. mainly affect R leg   Endocarditis 2018   Fibromyalgia    Heart murmur    OSA on CPAP    Pericarditis 2017   Positive cardiac stress test    Sarcoidosis    a. eye involvement only    Tobacco History: Social History   Tobacco Use  Smoking Status Former   Current packs/day: 0.12   Average packs/day: 0.1 packs/day for 4.0 years (0.5 ttl pk-yrs)   Types: Cigarettes  Smokeless Tobacco Never  Tobacco Comments   08/21/2017 stopped in the 1990s   Counseling given: Not Answered Tobacco comments: 08/21/2017 stopped in the 1990s   Outpatient Medications Prior to Visit  Medication Sig Dispense Refill   acetaminophen  (TYLENOL ) 500 MG tablet Take 1,000 mg by mouth 4 (four) times daily as needed for mild pain.     adalimumab (HUMIRA, 2 PEN,) 40 MG/0.4ML pen Inject 40 mg into the skin every 14 (fourteen) days.     albuterol  (PROVENTIL  HFA;VENTOLIN  HFA) 108 (90 Base) MCG/ACT inhaler Inhale 1-2 puffs into the lungs every 6 (six) hours as needed for wheezing or shortness of breath.     amiodarone  (PACERONE ) 200 MG tablet  Take 1 tablet (200 mg total) by mouth daily. 90 tablet 3   ascorbic acid (VITAMIN C) 500 MG tablet Take 500 mg by mouth daily.     Calcium  Carbonate-Vit D-Min (CALCIUM  1200) 1200-1000 MG-UNIT CHEW Chew 1,200 mg by mouth every morning. 30 tablet 5   capsaicin  (ZOSTRIX) 0.025 % cream Apply 1 application topically 2 (two) times daily as needed (skin care).      carboxymethylcellulose (REFRESH PLUS) 0.5 % SOLN Place 1 drop into both eyes 2 (two) times daily.     Cholecalciferol (VITAMIN D3) 50 MCG (2000 UT) capsule Take 2,000 Units by mouth daily.     cyclobenzaprine  (FLEXERIL ) 10 MG tablet Take 10 mg by mouth every 8 (eight) hours as needed for muscle spasms.     cycloSPORINE (RESTASIS) 0.05 % ophthalmic emulsion Place 1 drop into both eyes 2  (two) times daily.     diclofenac Sodium (VOLTAREN) 1 % GEL Apply 2 g topically 2 (two) times daily as needed (pain).     diphenhydrAMINE  (BENADRYL ) 50 MG capsule Take 50 mg by mouth at bedtime.     doxycycline (VIBRA-TABS) 100 MG tablet Take 1 tablet by mouth 2 (two) times daily.     empagliflozin (JARDIANCE) 25 MG TABS tablet Take 12.5 mg by mouth daily.     furosemide  (LASIX ) 20 MG tablet 40 mg daily, alternating with 20 mg daily. 180 tablet 3   gabapentin  (NEURONTIN ) 400 MG capsule Take 400-800 mg by mouth See admin instructions. 400 mg during the day, 800 mg at bedtime     guaiFENesin (MUCINEX) 600 MG 12 hr tablet Take 1 tablet by mouth every 12 (twelve) hours.     Ipratropium-Albuterol  (COMBIVENT) 20-100 MCG/ACT AERS respimat Inhale 1 puff into the lungs as needed.     metoprolol  succinate (TOPROL  XL) 50 MG 24 hr tablet Take 1.5 tablets (75 mg total) by mouth at bedtime. 90 tablet 3   Multiple Vitamins-Minerals (MULTIVITAMIN GUMMIES ADULT PO) Take 2 each by mouth daily.     nepafenac (ILEVRO) 0.3 % ophthalmic suspension 1 drop daily.     omeprazole  (PRILOSEC) 20 MG capsule Take 1 capsule (20 mg total) by mouth daily. 175 capsule 0   prednisoLONE  acetate (PRED FORTE ) 1 % ophthalmic suspension Place 1 drop into both eyes 2 (two) times daily.     rivaroxaban (XARELTO) 20 MG TABS tablet Take 20 mg by mouth daily.     sacubitril-valsartan (ENTRESTO ) 97-103 MG Take 1 tablet by mouth 2 (two) times daily.     Semaglutide,0.25 or 0.5MG /DOS, 2 MG/3ML SOPN Inject into the skin. TAKES EVERY MONDAY     sildenafil (VIAGRA) 100 MG tablet Take 50 mg by mouth daily as needed for erectile dysfunction.     spironolactone (ALDACTONE) 25 MG tablet Take 25 mg by mouth daily.     testosterone cypionate (DEPOTESTOSTERONE CYPIONATE) 200 MG/ML injection Inject 60 mg into the muscle every 14 (fourteen) days.     timolol  (TIMOPTIC -XR) 0.5 % ophthalmic gel-forming Place 1 drop into both eyes daily.     traMADol   (ULTRAM ) 50 MG tablet Take 50 mg by mouth 3 (three) times daily as needed for moderate pain. Maximum dose= 8 tablets per day For pain     urea 10 % lotion Apply 1 Application topically daily as needed for dry skin.     valACYclovir  (VALTREX ) 1000 MG tablet Take 1,000 mg by mouth daily.     No facility-administered medications prior to visit.  Review of Systems:   Constitutional:   No  weight loss, night sweats,  Fevers, chills, fatigue, or  lassitude.  HEENT:   No headaches,  Difficulty swallowing,  Tooth/dental problems, or  Sore throat,                No sneezing, itching, ear ache, nasal congestion, post nasal drip,   CV:  No chest pain,  Orthopnea, PND, swelling in lower extremities, anasarca, dizziness, palpitations, syncope.   GI  No heartburn, indigestion, abdominal pain, nausea, vomiting, diarrhea, change in bowel habits, loss of appetite, bloody stools.   Resp: Positive for dyspnea on exertion.  Positive for cough at night.  Intermittently productive of white sputum.  No coughing up of blood.  No change in color of mucus.  No wheezing.  No chest wall deformity  Skin: no rash or lesions.  GU: no dysuria, change in color of urine, no urgency or frequency.  No flank pain, no hematuria   MS:  No joint pain or swelling.  No decreased range of motion.  No back pain.    Physical Exam  BP 111/72 (BP Location: Left Arm, Patient Position: Sitting, Cuff Size: Large)   Pulse 76   Ht 5' 11 (1.803 m)   Wt 230 lb (104.3 kg)   SpO2 97%   BMI 32.08 kg/m   GEN: A/Ox3; pleasant , NAD, well nourished    HEENT:  [place]/AT,  EACs-clear, TMs-wnl, NOSE-clear, THROAT-clear, no lesions, no postnasal drip or exudate noted.   NECK:  Supple w/ fair ROM; no JVD; normal carotid impulses w/o bruits; no thyromegaly or nodules palpated; no lymphadenopathy.    RESP  Clear  P & A; w/o, wheezes/ rales/ or rhonchi. no accessory muscle use, no dullness to percussion  CARD:  RRR, +heart murmur, no  peripheral edema, pulses intact, no cyanosis or clubbing.  GI:   Soft & nt; nml bowel sounds; no organomegaly or masses detected.   Musco: Warm bil, no deformities or joint swelling noted.   Neuro: alert, no focal deficits noted.    Skin: Warm, no lesions or rashes    Lab Results:  CBC    Component Value Date/Time   WBC 5.1 09/20/2023 1140   RBC 4.99 09/20/2023 1140   HGB 16.7 09/20/2023 1140   HGB 18.7 (H) 09/25/2021 1402   HCT 48.3 09/20/2023 1140   HCT 53.0 (H) 09/25/2021 1402   PLT 245 09/20/2023 1140   PLT 160 09/25/2021 1402   MCV 96.8 09/20/2023 1140   MCV 97 09/25/2021 1402   MCH 33.5 09/20/2023 1140   MCHC 34.6 09/20/2023 1140   RDW 13.2 09/20/2023 1140   RDW 14.5 09/25/2021 1402   LYMPHSABS 1.2 12/10/2020 1135   MONOABS 0.4 12/10/2020 1135   EOSABS 0.2 12/10/2020 1135   BASOSABS 0.0 12/10/2020 1135    BMET    Component Value Date/Time   NA 138 09/30/2023 1405   NA 138 09/04/2021 1639   K 4.0 09/30/2023 1405   CL 104 09/30/2023 1405   CO2 25 09/30/2023 1405   GLUCOSE 93 09/30/2023 1405   BUN 17 09/30/2023 1405   BUN 16 09/04/2021 1639   CREATININE 1.71 (H) 09/30/2023 1405   CREATININE 1.08 09/05/2013 1516   CALCIUM  9.5 09/30/2023 1405   GFRNONAA 46 (L) 09/30/2023 1405   GFRAA 54 (L) 09/01/2017 0021    BNP    Component Value Date/Time   BNP 26.6 09/20/2023 1142    ProBNP  Component Value Date/Time   PROBNP <30.0 11/10/2008 0225    Imaging: ECHOCARDIOGRAM COMPLETE Result Date: 10/06/2023    ECHOCARDIOGRAM REPORT   Patient Name:   Richard Shepherd Date of Exam: 10/06/2023 Medical Rec #:  [id number]       Height:       71.0 in Accession #:    [id number]      Weight:       230.6 lb Date of Birth:  06/11/1965       BSA:          2.240 m Patient Age:    57 years        BP:           114/70 mmHg Patient Gender: M               HR:           71 bpm. Exam Location:  Outpatient Procedure: 2D Echo (Both Spectral and Color Flow Doppler were utilized  during            procedure). Indications:    congestive heart failure  History:        Patient has prior history of Echocardiogram examinations, most                 recent 03/30/2023. CHF, chronic kidney disease. Sarcoidosis.;                 Risk Factors:Dyslipidemia and Sleep Apnea.  Sonographer:    Tinnie Barefoot RDCS Referring Phys: [id number] DALTON S MCLEAN IMPRESSIONS  1. Left ventricular ejection fraction, by estimation, is 50%. The left ventricle has no regional wall motion abnormalities. Left ventricular diastolic parameters were normal by tissue Doppler.  2. Right ventricular systolic function is normal. The right ventricular size is normal. There is normal pulmonary artery systolic pressure. The estimated right ventricular systolic pressure is 31.1 mmHg.  3. Left atrial size was mild to moderately dilated.  4. Bileaflet mitral valve prolapse with eccentric MR and splay artifact. Upon reviewing TEE 03/30/23, MR is severe. The mitral valve is myxomatous. Severe mitral valve regurgitation, systolic reversals noted in lower pulmonary veins. No evidence of mitral stenosis.  5. The aortic valve is tricuspid. Aortic valve regurgitation is not visualized. No aortic stenosis is present.  6. The inferior vena cava is normal in size with greater than 50% respiratory variability, suggesting right atrial pressure of 3 mmHg. FINDINGS  Left Ventricle: Left ventricular ejection fraction, by estimation, is 50%. The left ventricle has low normal function. The left ventricle has no regional wall motion abnormalities. The left ventricular internal cavity size was normal in size. There is no left ventricular hypertrophy. Left ventricular diastolic parameters were normal. Right Ventricle: The right ventricular size is normal. No increase in right ventricular wall thickness. Right ventricular systolic function is normal. There is normal pulmonary artery systolic pressure. The tricuspid regurgitant velocity is 2.65 m/s, and  with  an assumed right atrial pressure of 3 mmHg, the estimated right ventricular systolic pressure is 31.1 mmHg. Left Atrium: Left atrial size was mild to moderately dilated. Right Atrium: Right atrial size was normal in size. Pericardium: There is no evidence of pericardial effusion. Mitral Valve: Bileaflet mitral valve prolapse with eccentric MR and splay artifact. Upon reviewing TEE 03/30/23, MR is severe. The mitral valve is myxomatous. Severe mitral valve regurgitation. No evidence of mitral valve stenosis. Tricuspid Valve: The tricuspid valve is normal in structure. Tricuspid valve regurgitation is trivial. No evidence of  tricuspid stenosis. Aortic Valve: The aortic valve is tricuspid. Aortic valve regurgitation is not visualized. No aortic stenosis is present. Pulmonic Valve: The pulmonic valve was normal in structure. Pulmonic valve regurgitation is trivial. No evidence of pulmonic stenosis. Aorta: The aortic root is normal in size and structure. Venous: A pattern of systolic flow reversal, suggestive of severe mitral regurgitation is recorded from the right lower pulmonary vein and the left lower pulmonary vein. The inferior vena cava is normal in size with greater than 50% respiratory variability, suggesting right atrial pressure of 3 mmHg. IAS/Shunts: No atrial level shunt detected by color flow Doppler.  LEFT VENTRICLE PLAX 2D LVOT diam:     2.10 cm      Diastology LV SV:         48           LV e' medial:    10.70 cm/s LV SV Index:   22           LV E/e' medial:  9.9 LVOT Area:     3.46 cm     LV e' lateral:   13.30 cm/s                             LV E/e' lateral: 8.0  LV Volumes (MOD) LV vol d, MOD A2C: 175.0 ml LV vol s, MOD A2C: 86.1 ml LV SV MOD A2C:     88.9 ml RIGHT VENTRICLE             IVC RV Basal diam:  3.00 cm     IVC diam: 1.50 cm RV S prime:     12.70 cm/s TAPSE (M-mode): 2.5 cm LEFT ATRIUM              Index        RIGHT ATRIUM           Index LA Vol (A2C):   104.0 ml 46.43 ml/m  RA Area:      16.50 cm LA Vol (A4C):   114.0 ml 50.89 ml/m  RA Volume:   41.70 ml  18.62 ml/m LA Biplane Vol: 118.0 ml 52.68 ml/m  AORTIC VALVE LVOT Vmax:   71.20 cm/s LVOT Vmean:  45.900 cm/s LVOT VTI:    0.140 m  AORTA Ao Root diam: 2.90 cm Ao Asc diam:  3.20 cm MITRAL VALVE                  TRICUSPID VALVE MV Area (PHT): 4.49 cm       TR Peak grad:   28.1 mmHg MV Decel Time: 169 msec       TR Vmax:        265.00 cm/s MR Peak grad:    123.7 mmHg MR Vmax:         556.00 cm/s  SHUNTS MR PISA Nyquist: 0.4 m/s      Systemic VTI:  0.14 m MR PISA:         4.02 cm     Systemic Diam: 2.10 cm MR PISA Radius:  0.80 cm MV E velocity: 106.00 cm/s MV A velocity: 90.80 cm/s MV E/A ratio:  1.17 Soyla Merck MD Electronically signed by Soyla Merck MD Signature Date/Time: 10/06/2023/7:18:21 PM    Final     Administration History     None           No data to display  No results found for: NITRICOXIDE   Assessment & Plan:  Ryun Velez is a 57 y/o male with PMH of asthma, OSA, sarcoidosis, CKD, RA, mitral valve insufficiency, and DVT who presents today for follow up. Reports history of asthma and uncontrolled cough with intermittent dyspnea.  Also not wearing CPAP at night. Assessment & Plan Sarcoidosis - Last CT completed in April 2024. - Calcified mediastinal nodes noted  OSA (obstructive sleep apnea) - Resume use of CPAP nightly; follow-up for compliance download -  Advised importance of continuing with CPAP nightly -  Advised not to drive while sleepy Mild intermittent asthma, unspecified whether complicated - Start Symbicort  2 puffs inhaled twice a day - Continue albuterol  as needed for dyspnea - Plan for pulmonary function testing as his last one was completed in 2021. - May need risk stratification at next appointment if mitral valve replacement is planned in the near future   Return in about 8 weeks (around 12/13/2023) for PFT and CPAP compliance review.  Candis Dandy,  PA-C 10/18/2023

## 2023-10-18 NOTE — Assessment & Plan Note (Signed)
-   Last CT completed in April 2024. - Calcified mediastinal nodes noted

## 2023-10-22 NOTE — Progress Notes (Signed)
 Patient notified. Pt has spoken with [facility].

## 2023-10-29 ENCOUNTER — Telehealth: Payer: Self-pay | Admitting: Internal Medicine

## 2023-10-29 NOTE — Telephone Encounter (Signed)
*  STAT* If patient is at the pharmacy, call can be transferred to refill team.   1. Which medications need to be refilled? (please list name of each medication and dose if known)   amiodarone  (PACERONE ) 200 MG tablet    2. Which pharmacy/location (including street and city if local pharmacy) is medication to be sent to? [facility] - [place], [place] - [address]   3. Do they need a 30 day or 90 day supply? 90 .

## 2023-10-29 NOTE — Telephone Encounter (Signed)
 This is a CHF pt

## 2023-11-01 ENCOUNTER — Other Ambulatory Visit (HOSPITAL_COMMUNITY): Payer: Self-pay

## 2023-11-01 MED ORDER — AMIODARONE HCL 200 MG PO TABS
200.0000 mg | ORAL_TABLET | Freq: Every day | ORAL | 3 refills | Status: AC
Start: 2023-11-01 — End: ?

## 2023-11-04 ENCOUNTER — Other Ambulatory Visit (HOSPITAL_BASED_OUTPATIENT_CLINIC_OR_DEPARTMENT_OTHER): Payer: Self-pay

## 2023-11-04 MED ORDER — BUDESONIDE-FORMOTEROL FUMARATE 160-4.5 MCG/ACT IN AERO: 2.0000 | INHALATION_SPRAY | Freq: Two times a day (BID) | RESPIRATORY_TRACT | 6 refills | Status: AC

## 2023-11-18 NOTE — Progress Notes (Addendum)
 PCP: [facility] Cardiology: Dr. Wendel EP: Dr. Cindie HF Cardiology: Dr. Rolan  Chief complaint: CHF  58 y.o. 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    Sarcoidosis was diagnosed in 1990s by lung biopsy.  He has no family history of sarcoidosis, it was thought to possibly be triggered by exposures during the [place] War (patient served in the army).  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 Back in 10/20, cardiac MRI was concerning for CS => subendocardial diffuse basal LGE not in a coronary distribution.  Echo in 10/21 showed EF 40-45%. Echo in 6/23 at [facility] showed EF 25-30%. Patient had a Zio monitor in 5/23 showing 19% PVCs.  He was seen by Dr. Wendel who was concerned for active cardiac sarcoidosis.  Coronary CTA was done in 7/23 showed no significant CAD. Patient was started on po steroids by Dr. Wendel and was referred to Dr. Cindie for EP evaluation.  Patient has been on adalimumab through his rheumatologist at the [facility]. Prior to this, he was on mycophenolate .  He says that he has not taken methotrexate.   Cardiac PET in 8/23 showed EF 43%, no abnormal metabolism to suggest active sarcoidosis.   Zio 2 day (10/23) Mostly NSR, 1.5 % PVC burden.  cMRI (2/24) showed LVEF 50% with severe dilation, RVEF  51%, mitral valve with evidence of prior endocarditis with mild MR, basal LGE (resolution of prior apical LGE).  Repeat cardiac PET in 11/24 showed EF 38%, no active inflammation.   TEE (1/25) showed EF 45-50%, mild LV dilation, normal RV, posterior mitral leaflet prolapse but also possible anterior leaflet perforation with a posteriorly-directed MR jet, MR appeared severe with PISA ERO 0.42 cm^2 and 3-D vena contracta area 0.58.     LHC/RHC (2/25) showed normal coronaries; mean RA 8, PA 30/15, mean PCWP 15, CI 2.02.   He was seen in structural heart clinic with discussion between Dr. Wendel and [facility]CTS regarding Mitraclip vs surgery for his mitral valve.  Ultimately, it appears that he was referred by someone from the [place] to Dr. Burnie Meeter [facility] for mitral valve repair. Stabilization of his sarcoidosis was recommended before proceeding with surgery. Has been following with Pulmonary and his Ophthalmologist.   He is here today for CHF follow-up. His weight has been stable around 226 lb. His cough and shortness of breath improved after inhalers were recently adjusted by his pulmonologist. He walks on the treadmill for 20 minutes without significant shortness of breath. Main limitation is from chronic right hip and ankle pain. No orthopnea or PND. Notes occasional lower extremity edema. Thinks he may be retaining a little bit of fluid. Taking meds as prescribed. Had been called to cut back lasix  from 40 mg lasix  alternating with 20 mg every other day to just 20 mg daily d/t worse kidney function but never got the message. He is not using CPAP regularly.   PMH: 1. HTN 2. CKD stage 3 3. DVT: Spontaneous, 2007 and again in 2008.  4. OSA 5. 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 Mitral regurgitation:  H/o mitral valve endocarditis in 2018, Strep bacteremia.  - TEE in 1/25 with posterior mitral leaflet prolapse but also possible anterior leaflet perforation with a posteriorly-directed MR jet, MR appeared severe with PISA ERO 0.42 cm^2  and 3-D vena contracta area 0.58. 7. Erectile dysfunction 8. Sarcoidosis: Ocular, pulmonary, cardiac, and hepatic.  Diagnosed in 1990s via lung biopsy.  9. NSVT/PVCs: Zio monitor (5/23) with 19% PVCs and 1 run NSVT.  - Zio monitor (8/23): 13.7% PVCs - Zio 2 day (10/23): Mostly NSR, 1.5 % PVC burden. 10. Chronic systolic CHF: Thought to be due to cardiac sarcoidosis.  - Coronary CTA (7/23): Calcium  score  0, normal coronaries.  - Echo (10/21): EF 40-45% - cMRI (10/20): EF 50%, diffuse basal segment subendocardial LGE, apical inferoseptal LGE => not in coronary distribution, concern for cardiac sarcoidosis.  - Echo (6/23, [facility]): EF 50-55%, mild LVH, RV normal (difficult images, EF probably worse than reported).  - Echo (6/23, [facility]): EF 25-30%, akinesis basal anteroseptal wall.  - Cardiac PET (8/23): EF 43%, no abnormal metabolism to suggest active sarcoidosis. - cMRI (2/24):  LVEF 50% with severe dilation, RVEF  51%, mitral valve with evidence of prior endocarditis with mild MR, basal inferolateral patchy LGE (resolution of prior apical LGE). - Cardiac PET (11/24): EF 38%, no active inflammation.  - TEE (1/25): EF 45-50%, mild LV dilation, normal RV, posterior mitral leaflet prolapse but also possible anterior leaflet perforation with a posteriorly-directed MR jet, MR appeared severe with PISA ERO 0.42 cm^2 and 3-D vena contracta area 0.58.  - LHC/RHC (2/25) showed normal coronaries; mean RA 8, PA 30/15, mean PCWP 15, CI 2.02.   Social History   Socioeconomic History   Marital status: Married    Spouse name: Not on file   Number of children: Not on file   Years of education: Not on file   Highest education level: Not on file  Occupational History   Not on file  Tobacco Use   Smoking status: Former    Current packs/day: 0.12    Average packs/day: 0.1 packs/day for 4.0 years (0.5 ttl pk-yrs)    Types: Cigarettes   Smokeless tobacco: Never   Tobacco comments:    08/21/2017 stopped in the 1990s  Vaping Use   Vaping status: Never Used  Substance and Sexual Activity   Alcohol  use: Yes    Comment: 08/31/2017 might have a margarita once/month; if that   Drug use: Never   Sexual activity: Yes  Other Topics Concern   Not on file  Social History Narrative   Not on file   Social Drivers of Health   Financial Resource Strain: Not on file  Food Insecurity: Not on file   Transportation Needs: Not on file  Physical Activity: Not on file  Stress: Not on file  Social Connections: Not on file  Intimate Partner Violence: Not on file   Family History  Problem Relation Age of Onset   Breast cancer Mother    Multiple sclerosis Father    Multiple sclerosis Brother    Diabetes Brother    ROS: All systems reviewed and negative except as per HPI.   Current Outpatient Medications  Medication Sig Dispense Refill   acetaminophen  (TYLENOL ) 500 MG tablet Take 1,000 mg by mouth 4 (four) times daily as needed for mild pain.     adalimumab (HUMIRA, 2 PEN,) 40 MG/0.4ML pen Inject 40 mg into the skin every 14 (fourteen) days.     albuterol  (PROVENTIL  HFA;VENTOLIN  HFA) 108 (90 Base) MCG/ACT inhaler Inhale 1-2 puffs into the lungs every 6 (six) hours as needed for wheezing or shortness of breath.     amiodarone  (PACERONE ) 200 MG tablet Take 1 tablet (200 mg total)  by mouth daily. 90 tablet 3   ascorbic acid (VITAMIN C) 500 MG tablet Take 500 mg by mouth daily.     azaTHIOprine  (IMURAN ) 50 MG tablet Take 1 tablet (50 mg total) by mouth daily.     budesonide -formoterol  (SYMBICORT ) 160-4.5 MCG/ACT inhaler Inhale 2 puffs into the lungs 2 (two) times daily. 10.2 each 6   Calcium  Carbonate-Vit D-Min (CALCIUM  1200) 1200-1000 MG-UNIT CHEW Chew 1,200 mg by mouth every morning. 30 tablet 5   capsaicin  (ZOSTRIX) 0.025 % cream Apply 1 application topically 2 (two) times daily as needed (skin care).      carboxymethylcellulose (REFRESH PLUS) 0.5 % SOLN Place 1 drop into both eyes 2 (two) times daily.     Cholecalciferol (VITAMIN D3) 50 MCG (2000 UT) capsule Take 2,000 Units by mouth daily.     cyclobenzaprine  (FLEXERIL ) 10 MG tablet Take 10 mg by mouth every 8 (eight) hours as needed for muscle spasms.     cycloSPORINE (RESTASIS) 0.05 % ophthalmic emulsion Place 1 drop into both eyes 2 (two) times daily.     diclofenac Sodium (VOLTAREN) 1 % GEL Apply 2 g topically 2 (two) times daily as  needed (pain).     diphenhydrAMINE  (BENADRYL ) 50 MG capsule Take 50 mg by mouth at bedtime.     doxycycline (VIBRA-TABS) 100 MG tablet Take 1 tablet by mouth 2 (two) times daily.     empagliflozin (JARDIANCE) 25 MG TABS tablet Take 12.5 mg by mouth daily.     furosemide  (LASIX ) 20 MG tablet 40 mg daily, alternating with 20 mg daily. 180 tablet 3   gabapentin  (NEURONTIN ) 400 MG capsule Take 400-800 mg by mouth See admin instructions. 400 mg during the day, 800 mg at bedtime     guaiFENesin (MUCINEX) 600 MG 12 hr tablet Take 1 tablet by mouth every 12 (twelve) hours.     Ipratropium-Albuterol  (COMBIVENT) 20-100 MCG/ACT AERS respimat Inhale 1 puff into the lungs as needed.     metoprolol  succinate (TOPROL  XL) 50 MG 24 hr tablet Take 1.5 tablets (75 mg total) by mouth at bedtime. 90 tablet 3   Multiple Vitamins-Minerals (MULTIVITAMIN GUMMIES ADULT PO) Take 2 each by mouth daily.     nepafenac (ILEVRO) 0.3 % ophthalmic suspension 1 drop daily.     omeprazole  (PRILOSEC) 20 MG capsule Take 1 capsule (20 mg total) by mouth daily. 175 capsule 0   prednisoLONE  acetate (PRED FORTE ) 1 % ophthalmic suspension Place 1 drop into both eyes 2 (two) times daily.     rivaroxaban (XARELTO) 20 MG TABS tablet Take 20 mg by mouth daily.     sacubitril-valsartan (ENTRESTO ) 97-103 MG Take 1 tablet by mouth 2 (two) times daily.     Semaglutide,0.25 or 0.5MG /DOS, 2 MG/3ML SOPN Inject into the skin. TAKES EVERY MONDAY     sildenafil (VIAGRA) 100 MG tablet Take 50 mg by mouth daily as needed for erectile dysfunction.     spironolactone (ALDACTONE) 25 MG tablet Take 25 mg by mouth daily.     testosterone cypionate (DEPOTESTOSTERONE CYPIONATE) 200 MG/ML injection Inject 60 mg into the muscle every 14 (fourteen) days.     timolol  (TIMOPTIC -XR) 0.5 % ophthalmic gel-forming Place 1 drop into both eyes daily.     traMADol  (ULTRAM ) 50 MG tablet Take 50 mg by mouth 3 (three) times daily as needed for moderate pain. Maximum dose= 8  tablets per day For pain     urea 10 % lotion Apply 1 Application topically daily as needed  for dry skin.     valACYclovir  (VALTREX ) 1000 MG tablet Take 1,000 mg by mouth daily.     No current facility-administered medications for this encounter.   Wt Readings from Last 3 Encounters:  11/22/23 103.9 kg (229 lb)  10/18/23 104.3 kg (230 lb)  09/20/23 104.6 kg (230 lb 9.6 oz)   BP 126/62   Pulse 76   Ht 5' 11 (1.803 m)   Wt 103.9 kg (229 lb)   SpO2 97%   BMI 31.94 kg/m  General:  Well appearing. Ambulated into clinic. Neck:JVP 7-8 Cor: Regular rate & rhythm. 2/6 HSM best heard at apex Lungs: clear Abdomen: soft, nontender, nondistended.  Extremities: 1+ edema Neuro: alert & orientedx3. Affect pleasant   Assessment/Plan: 1. Chronic systolic CHF: Nonischemic cardiomyopathy.  Coronary CTA (7/23) with no significant CAD. Strong suspicion for cardiac sarcoidosis.  Patient has tissue diagnosis of sarcoidosis from lung biopsy. He has h/o frequent PVCs/NSVT as well as decreased LV systolic function and LGE on cMRI. Cardiac MRI in 10/20 with EF 50%, diffuse basal segment subendocardial LGE, apical inferoseptal LGE => not in coronary distribution, concern for cardiac sarcoidosis. Echo from [facility] in 6/23 with EF 25-30%.  Cardiac PET in 8/23 showed EF 43%, no abnormal metabolism to suggest active sarcoidosis. cMRI (2/24) showed LVEF improved 50%, basal inferolateral patchy LGE.  Cardiac PET repeated in 11/24 showed EF 38%, no active inflammation.  TEE (1/25) showed EF 45-50%, mild LV dilation, normal RV, posterior mitral leaflet prolapse but also possible anterior leaflet perforation with a posteriorly-directed MR jet, MR appeared severe with PISA ERO 0.42 cm^2 and 3-D vena contracta area 0.58.  LHC/RHC (2/25) showed normal coronaries; mean RA 8, PA 30/15, mean PCWP 15, CI 2.02. Echo 10/06/23: LVEF 50%, RV okay, mitral valve prolapse with severe eccentric MR.  - NYHA II. Volume mildly up  on exam and by ReDS which is 39% - Currently on lasix  40 mg alternating with 20 mg every other day. Will have him increase lasix  to 40 mg daily X 1 week then resume regular dosing - continue Entresto  97/103 bid - continue spironolactone 25 mg daily - continue empagliflozin 12.5 mg daily - continue toprol  xl 75 mg at bedtime - labs today - He needs correction of mitral regurgitation, see below.  - He has seen EP. With minimal LGE and improved EF on last cMRI in 2/24, felt ICD not indicated. 2. Sarcoidosis: Patient has biopsy-diagnosed sarcoidosis with involvement of lungs, hilar lymphadenopathy, liver, and eyes. Strongly suspect cardiac sarcoidosis with concerning LGE pattern on 2020 MRI and PVCs/NSVT.  Diagnosis was made in the 1990s. He has been on mycophenolate  in the past and is now on adalimumab (x 2 years).  He was started on prednisone  due to frequent PVCs and NSVT on monitoring for more aggressive treatment of cardiac sarcoidosis.  He is now off prednisone .  Cardiac PET at [facility] in 8/23 showed EF 43%, no abnormal metabolism to suggest active sarcoidosis, repeat cardiac PET in 11/24 showed EF 38% with no active inflammation suggestive of active sarcoidosis. - Continue rheumatology followup for Humira. Seen at [facility]. - He is now followed by Dr. Kara for pulmonary component of sarcoidosis. - Also sees ophthalmology. Recently started azathioprine . - repeat cardiac PET 11/25, ordered today 3. DVT: Spontaneous, x 2.   - Continue Xarelto.  4. 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  He has been started on amiodarone .  - Continue amiodarone  200 mg daily.  LFTs and TSH. Continue  with regular eye exams 5. Mitral regurgitation: Patient has posterior MV prolapse, but suspect his severe MR is from prior endocarditis (episode of Strep endocarditis in 2018). TEE in 1/25 showed posterior mitral leaflet prolapse but also possible anterior leaflet perforation with a posteriorly-directed MR jet, MR  appeared severe with PISA ERO 0.42 cm^2 and 3-D vena contracta area 0.58 cm^2. EF has improved over time, EF most recently 50% on TTE in 07/25. - Has saw Dr. Burnie at [facility] in 8/25 for consideration of mitral valve repair. Needs better control of his sarcoidosis before proceeding with surgery. Following with multiple specialists as above. 6. OSA: Recommended better adherence with CPAP  Follow up 3 months with Dr. Rolan    [facility], MANUELITA N  11/22/2023

## 2023-11-22 ENCOUNTER — Ambulatory Visit (HOSPITAL_COMMUNITY)
Admission: RE | Admit: 2023-11-22 | Discharge: 2023-11-22 | Disposition: A | Discharge status: Home / Self Care | Source: Ambulatory Visit | Attending: Physician Assistant

## 2023-11-22 ENCOUNTER — Ambulatory Visit (HOSPITAL_COMMUNITY): Payer: Self-pay | Admitting: Physician Assistant

## 2023-11-22 ENCOUNTER — Encounter (HOSPITAL_COMMUNITY): Payer: Self-pay

## 2023-11-22 VITALS — BP 126/62 | HR 76 | Ht 71.0 in | Wt 229.0 lb

## 2023-11-22 DIAGNOSIS — I493 Ventricular premature depolarization: Secondary | ICD-10-CM | POA: Insufficient documentation

## 2023-11-22 DIAGNOSIS — I5022 Chronic systolic (congestive) heart failure: Secondary | ICD-10-CM | POA: Diagnosis present

## 2023-11-22 DIAGNOSIS — M069 Rheumatoid arthritis, unspecified: Secondary | ICD-10-CM | POA: Insufficient documentation

## 2023-11-22 DIAGNOSIS — D8689 Sarcoidosis of other sites: Secondary | ICD-10-CM | POA: Insufficient documentation

## 2023-11-22 DIAGNOSIS — I341 Nonrheumatic mitral (valve) prolapse: Secondary | ICD-10-CM | POA: Insufficient documentation

## 2023-11-22 DIAGNOSIS — Z79624 Long term (current) use of inhibitors of nucleotide synthesis: Secondary | ICD-10-CM | POA: Insufficient documentation

## 2023-11-22 DIAGNOSIS — I340 Nonrheumatic mitral (valve) insufficiency: Secondary | ICD-10-CM | POA: Insufficient documentation

## 2023-11-22 DIAGNOSIS — Z87891 Personal history of nicotine dependence: Secondary | ICD-10-CM | POA: Diagnosis not present

## 2023-11-22 DIAGNOSIS — I428 Other cardiomyopathies: Secondary | ICD-10-CM | POA: Diagnosis present

## 2023-11-22 DIAGNOSIS — I472 Ventricular tachycardia, unspecified: Secondary | ICD-10-CM | POA: Diagnosis not present

## 2023-11-22 DIAGNOSIS — H209 Unspecified iridocyclitis: Secondary | ICD-10-CM | POA: Insufficient documentation

## 2023-11-22 DIAGNOSIS — Z79631 Long term (current) use of antimetabolite agent: Secondary | ICD-10-CM | POA: Diagnosis not present

## 2023-11-22 DIAGNOSIS — Z7901 Long term (current) use of anticoagulants: Secondary | ICD-10-CM | POA: Insufficient documentation

## 2023-11-22 DIAGNOSIS — Z8679 Personal history of other diseases of the circulatory system: Secondary | ICD-10-CM | POA: Diagnosis not present

## 2023-11-22 DIAGNOSIS — I130 Hypertensive heart and chronic kidney disease with heart failure and stage 1 through stage 4 chronic kidney disease, or unspecified chronic kidney disease: Secondary | ICD-10-CM | POA: Diagnosis present

## 2023-11-22 DIAGNOSIS — R590 Localized enlarged lymph nodes: Secondary | ICD-10-CM | POA: Insufficient documentation

## 2023-11-22 DIAGNOSIS — D8685 Sarcoid myocarditis: Secondary | ICD-10-CM | POA: Diagnosis not present

## 2023-11-22 DIAGNOSIS — Z7984 Long term (current) use of oral hypoglycemic drugs: Secondary | ICD-10-CM | POA: Insufficient documentation

## 2023-11-22 DIAGNOSIS — G8929 Other chronic pain: Secondary | ICD-10-CM | POA: Diagnosis not present

## 2023-11-22 DIAGNOSIS — Z7951 Long term (current) use of inhaled steroids: Secondary | ICD-10-CM | POA: Diagnosis not present

## 2023-11-22 DIAGNOSIS — N183 Chronic kidney disease, stage 3 unspecified: Secondary | ICD-10-CM | POA: Insufficient documentation

## 2023-11-22 DIAGNOSIS — D869 Sarcoidosis, unspecified: Secondary | ICD-10-CM | POA: Diagnosis not present

## 2023-11-22 DIAGNOSIS — Z79899 Other long term (current) drug therapy: Secondary | ICD-10-CM | POA: Insufficient documentation

## 2023-11-22 DIAGNOSIS — G4733 Obstructive sleep apnea (adult) (pediatric): Secondary | ICD-10-CM | POA: Diagnosis not present

## 2023-11-22 LAB — COMPREHENSIVE METABOLIC PANEL WITH GFR
ALT: 15 U/L (ref 0–44)
AST: 26 U/L (ref 15–41)
Albumin: 3.5 g/dL (ref 3.5–5.0)
Alkaline Phosphatase: 53 U/L (ref 38–126)
Anion gap: 11 (ref 5–15)
BUN: 17 mg/dL (ref 6–20)
CO2: 25 mmol/L (ref 22–32)
Calcium: 9 mg/dL (ref 8.9–10.3)
Chloride: 101 mmol/L (ref 98–111)
Creatinine, Ser: 1.6 mg/dL — ABNORMAL HIGH (ref 0.61–1.24)
GFR, Estimated: 50 mL/min — ABNORMAL LOW (ref >60)
Glucose, Bld: 93 mg/dL (ref 70–99)
Potassium: 4.3 mmol/L (ref 3.5–5.1)
Sodium: 137 mmol/L (ref 135–145)
Total Bilirubin: 0.5 mg/dL (ref 0.0–1.2)
Total Protein: 7.2 g/dL (ref 6.5–8.1)

## 2023-11-22 LAB — BRAIN NATRIURETIC PEPTIDE: B Natriuretic Peptide: 26.1 pg/mL (ref 0.0–100.0)

## 2023-11-22 LAB — TSH: TSH: 3.967 u[IU]/mL (ref 0.350–4.500)

## 2023-11-22 MED ORDER — AZATHIOPRINE 50 MG PO TABS: 50.0000 mg | ORAL_TABLET | Freq: Every day | ORAL | Status: AC

## 2023-11-22 NOTE — Patient Instructions (Addendum)
 Take Lasix  40 mg daily for one week, then return to 40 mg alternating with 20 mg.  Labs today - will call you if abnormal. We have ordered a Cardiac PET scan for November. We will call you to schedule after prior authorization is obtained. Return to see Dr. Mclean in 3 months - see below. Please call us  at [phone] if any questions or concerns prior to your next appointment.     Cardiac Sarcoidosis/Inflammation PET Scan Patient Instructions  Please report to Radiology at the [facility] Main Entrance 15 minutes early for your test.  [address] [place], [place] [address]  BRING FOOD DIARY WITH YOU TO THIS APPOINTMENT For 24 hours before the test: Do not exercise! Do not eat after 5 pm the day before your test! To make sure that your scanning results are accurate, you MUST follow the sarcoid prep meal diet starting the day before your PET scan. This diet involves eating no carbohydrates 24 hours before the test.  You will keep a log of all that you eat the day before your test. If you have questions or do not understand this diet, please call [phone] for more information. If you are unable to follow this diet, please discuss an alternative strategy with the coordinator.  If you are diabetic, continue your diabetes medications as usual on the day before until you begin to fast. NO DIABETES MEDICATIONS ONCE YOU BEGIN TO FAST. On the morning of your test, please wait until after your test to take your morning medications.  What foods can I eat the day before my test?  Drink only water or black coffee (WITHOUT sugar, artificial sweetener, cream, or milk). Eggs (prepared without milk or cheese)  Meat that is either broiled or pan fried in butter WITHOUT breading (chicken, [place], bacon, meat-only sausage, hamburger, steak, fish) Butter, salt & pepper What foods must I AVOID the day before my test?  Do not consume alcoholic beverages, sodas, fruit juice, coffee creamer,  or sports drinks  Do not eat vegetables, beans, nuts, fruits, juices, bread, grains, rice, pasta, potatoes, or any baked goods Do not eat dairy products (milk, cheese, etc.)  Do not eat mayonnaise, ketchup, tartar sauce, mustard, or other condiments Do not add sugar, artificial sweeteners, or Splenda (sucralose) to foods or drinks  Do not eat breaded foods (like fried chicken)  Do not eat sweets, candy, gum, sweetened cough drops, lozenges, or sugar  Do not eat sweetened, grilled, or cured meats or meat with carbohydrate-containing additives (some sausages, ham, sweetened bacon)  Suggested items for breakfast, lunch, or dinner:  Breakfast  3 to 5 fatty sausage links fried in butter. 3 to 5 bacon strips.  3 eggs pan fried in butter (no milk or cheese).  Lunch/Dinner  2 hamburger patties fried in butter. Chicken or fatty fish pan fried in butter. No breading. 8 oz. fatty steak pan fried in butter.  Beverages  Drink only water or black coffee. DO NOT ADD SUGAR, ARTIFICIAL SWEETENER, CREAM, OR MILK  Please call [facility] to cancel or reschedule your appointment at [phone] For more information and frequently asked questions, please visit our website : [email or web address]     Cardiac Sarcoidosis/Inflammation PET Scan  Food Diary Name: _____________________________ Please fill in EXACTLY what you have eaten and when for 24 hours PRIOR to your test date.  Time Food/Drink Comments  Breakfast  Lunch                Dinner                Snacks                 DO NOT EXERCISE THE DAY BEFORE YOUR TEST DO NOT EAT AFTER 5 PM THE DAY BEFORE YOUR TEST.  ON THE DAY OF YOUR TEST, DO NOT EAT ANY FOOD AND ONLY DRINK CLEAR WATER! PLEASE BRING THIS FOOD DIARY WITH YOU TO YOUR APPOINTMENT

## 2023-11-22 NOTE — Progress Notes (Signed)
 ReDS Vest / Clip - 11/22/23 1106       ReDS Vest / Clip   Station Marker D    Ruler Value 34    ReDS Value Range Moderate volume overload    ReDS Actual Value 39

## 2023-12-10 ENCOUNTER — Ambulatory Visit: Admitting: Pulmonary Disease

## 2023-12-16 ENCOUNTER — Encounter: Payer: Self-pay | Admitting: Pulmonary Disease

## 2023-12-16 ENCOUNTER — Ambulatory Visit: Admitting: Pulmonary Disease

## 2023-12-16 ENCOUNTER — Encounter

## 2023-12-16 NOTE — Progress Notes (Deleted)
 Synopsis: Referred in March 2024 for Sarcoidosis by Derinda Borg, MD  Subjective:   PATIENT ID: Richard Shepherd GENDER: male DOB: 03-Jul-1965, MRN: [id number]  HPI  No chief complaint on file.  Richard Shepherd is a 58 year old male, former smoker with history of sarcoidosis, rheumatoid arthritis, DVT, CKD III, OSA on CPAP and chronic systolic heart failure who is referred to pulmonary clinic for sarcoidosis.   He was diagnosed with Sarcoidosis in the 90s via lung biopsies. He had chest pains, vision changes, shortness of breath and weight loss at the time. He was treated with prednisone  for a year. 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 He was recently treated for sarcoidosis of the heart with extended steroid taper by Dr. Rolan. Most recent echo and cardiac MRI show normal EF. He is on amiodarone  for PVCs.   He has history of rheumatoid arthritis and is on adalimumab.   He denies cough, wheezing or shortness of breath at this time. He was recently sick with the flu and he has had a prolonged recovery but is feeling much better.   PFTs from 2021 at the [facility] show FEV1/FVC 83, FVC 71.6% (3.05L), FEV1 75.5% (2.54L), TLC 60.7%, DLCO 83%.   He is retired from [employer]. He worked for [employer] after [employer]. He lives with his wife. He has 3 children. No family history of sarcoidosis. His father has history of MS and died of an MI. His brother has MS. Mother had breast cancer and multiple family members on her side have RA.   Past Medical History:  Diagnosis Date   Arthritis    all over (08/31/2017)   Asthma    Chest pain    Chronic lower back pain    CKD (chronic kidney disease), stage III (HCC)    thelbert 08/31/2017   DVT (deep venous thrombosis) (HCC) < & 06/2006   a. mainly affect R leg   Endocarditis 2018   Fibromyalgia    Heart murmur    OSA on CPAP    Pericarditis 2017   Positive cardiac stress test    Sarcoidosis    a. eye involvement only     Family History   Problem Relation Age of Onset   Breast cancer Mother    Multiple sclerosis Father    Multiple sclerosis Brother    Diabetes Brother      Social History   Socioeconomic History   Marital status: Married    Spouse name: Not on file   Number of children: Not on file   Years of education: Not on file   Highest education level: Not on file  Occupational History   Not on file  Tobacco Use   Smoking status: Former    Current packs/day: 0.12    Average packs/day: 0.1 packs/day for 4.0 years (0.5 ttl pk-yrs)    Types: Cigarettes   Smokeless tobacco: Never   Tobacco comments:    08/21/2017 stopped in the 1990s  Vaping Use   Vaping status: Never Used  Substance and Sexual Activity   Alcohol  use: Yes    Comment: 08/31/2017 might have a margarita once/month; if that   Drug use: Never   Sexual activity: Yes  Other Topics Concern   Not on file  Social History Narrative   Not on file   Social Drivers of Health   Financial Resource Strain: Not on file  Food Insecurity: Not on file  Transportation Needs: Not on file  Physical Activity:  Not on file  Stress: Not on file  Social Connections: Not on file  Intimate Partner Violence: Not on file     Allergies  Allergen Reactions   Other Other (See Comments)    Dogs and Cats   Humira (2 Pen) [Adalimumab] Rash    Generic causes skin rash      Outpatient Medications Prior to Visit  Medication Sig Dispense Refill   acetaminophen  (TYLENOL ) 500 MG tablet Take 1,000 mg by mouth 4 (four) times daily as needed for mild pain.     adalimumab (HUMIRA, 2 PEN,) 40 MG/0.4ML pen Inject 40 mg into the skin every 14 (fourteen) days.     albuterol  (PROVENTIL  HFA;VENTOLIN  HFA) 108 (90 Base) MCG/ACT inhaler Inhale 1-2 puffs into the lungs every 6 (six) hours as needed for wheezing or shortness of breath.     amiodarone  (PACERONE ) 200 MG tablet Take 1 tablet (200 mg total) by mouth daily. 90 tablet 3   ascorbic acid (VITAMIN C) 500 MG tablet Take  500 mg by mouth daily.     azaTHIOprine  (IMURAN ) 50 MG tablet Take 1 tablet (50 mg total) by mouth daily.     budesonide -formoterol  (SYMBICORT ) 160-4.5 MCG/ACT inhaler Inhale 2 puffs into the lungs 2 (two) times daily. 10.2 each 6   Calcium  Carbonate-Vit D-Min (CALCIUM  1200) 1200-1000 MG-UNIT CHEW Chew 1,200 mg by mouth every morning. 30 tablet 5   capsaicin  (ZOSTRIX) 0.025 % cream Apply 1 application topically 2 (two) times daily as needed (skin care).      carboxymethylcellulose (REFRESH PLUS) 0.5 % SOLN Place 1 drop into both eyes 2 (two) times daily.     Cholecalciferol (VITAMIN D3) 50 MCG (2000 UT) capsule Take 2,000 Units by mouth daily.     cyclobenzaprine  (FLEXERIL ) 10 MG tablet Take 10 mg by mouth every 8 (eight) hours as needed for muscle spasms.     cycloSPORINE (RESTASIS) 0.05 % ophthalmic emulsion Place 1 drop into both eyes 2 (two) times daily.     diclofenac Sodium (VOLTAREN) 1 % GEL Apply 2 g topically 2 (two) times daily as needed (pain).     diphenhydrAMINE  (BENADRYL ) 50 MG capsule Take 50 mg by mouth at bedtime.     doxycycline (VIBRA-TABS) 100 MG tablet Take 1 tablet by mouth 2 (two) times daily.     empagliflozin (JARDIANCE) 25 MG TABS tablet Take 12.5 mg by mouth daily.     furosemide  (LASIX ) 20 MG tablet 40 mg daily, alternating with 20 mg daily. 180 tablet 3   gabapentin  (NEURONTIN ) 400 MG capsule Take 400-800 mg by mouth See admin instructions. 400 mg during the day, 800 mg at bedtime     guaiFENesin (MUCINEX) 600 MG 12 hr tablet Take 1 tablet by mouth every 12 (twelve) hours.     Ipratropium-Albuterol  (COMBIVENT) 20-100 MCG/ACT AERS respimat Inhale 1 puff into the lungs as needed.     metoprolol  succinate (TOPROL  XL) 50 MG 24 hr tablet Take 1.5 tablets (75 mg total) by mouth at bedtime. 90 tablet 3   Multiple Vitamins-Minerals (MULTIVITAMIN GUMMIES ADULT PO) Take 2 each by mouth daily.     nepafenac (ILEVRO) 0.3 % ophthalmic suspension 1 drop daily.     omeprazole   (PRILOSEC) 20 MG capsule Take 1 capsule (20 mg total) by mouth daily. 175 capsule 0   prednisoLONE  acetate (PRED FORTE ) 1 % ophthalmic suspension Place 1 drop into both eyes 2 (two) times daily.     rivaroxaban (XARELTO) 20 MG TABS tablet Take  20 mg by mouth daily.     sacubitril-valsartan (ENTRESTO ) 97-103 MG Take 1 tablet by mouth 2 (two) times daily.     Semaglutide,0.25 or 0.5MG /DOS, 2 MG/3ML SOPN Inject into the skin. TAKES EVERY MONDAY     sildenafil (VIAGRA) 100 MG tablet Take 50 mg by mouth daily as needed for erectile dysfunction.     spironolactone (ALDACTONE) 25 MG tablet Take 25 mg by mouth daily.     testosterone cypionate (DEPOTESTOSTERONE CYPIONATE) 200 MG/ML injection Inject 60 mg into the muscle every 14 (fourteen) days.     timolol  (TIMOPTIC -XR) 0.5 % ophthalmic gel-forming Place 1 drop into both eyes daily.     traMADol  (ULTRAM ) 50 MG tablet Take 50 mg by mouth 3 (three) times daily as needed for moderate pain. Maximum dose= 8 tablets per day For pain     urea 10 % lotion Apply 1 Application topically daily as needed for dry skin.     valACYclovir  (VALTREX ) 1000 MG tablet Take 1,000 mg by mouth daily.     No facility-administered medications prior to visit.   Review of Systems  Constitutional:  Negative for chills, fever, malaise/fatigue and weight loss.  HENT:  Negative for congestion, sinus pain and sore throat.   Eyes: Negative.   Respiratory:  Negative for cough, hemoptysis, sputum production, shortness of breath and wheezing.   Cardiovascular:  Negative for chest pain, palpitations, orthopnea, claudication and leg swelling.  Gastrointestinal:  Negative for abdominal pain, heartburn, nausea and vomiting.  Genitourinary: Negative.   Musculoskeletal:  Negative for joint pain and myalgias.  Skin:  Negative for rash.  Neurological:  Negative for weakness.  Endo/Heme/Allergies: Negative.   Psychiatric/Behavioral: Negative.     Objective:   There were no vitals filed  for this visit.  Physical Exam Constitutional:      General: He is not in acute distress. HENT:     Head: Normocephalic and atraumatic.  Eyes:     Extraocular Movements: Extraocular movements intact.     Conjunctiva/sclera: Conjunctivae normal.     Pupils: Pupils are equal, round, and reactive to light.  Cardiovascular:     Rate and Rhythm: Normal rate and regular rhythm.     Pulses: Normal pulses.     Heart sounds: Normal heart sounds. No murmur heard. Pulmonary:     Effort: Pulmonary effort is normal.     Breath sounds: Normal breath sounds.  Abdominal:     General: Bowel sounds are normal.     Palpations: Abdomen is soft.  Musculoskeletal:     Right lower leg: No edema.     Left lower leg: No edema.  Lymphadenopathy:     Cervical: No cervical adenopathy.  Skin:    General: Skin is warm and dry.  Neurological:     General: No focal deficit present.     Mental Status: He is alert.  Psychiatric:        Mood and Affect: Mood normal.        Behavior: Behavior normal.        Thought Content: Thought content normal.        Judgment: Judgment normal.    CBC    Component Value Date/Time   WBC 5.1 09/20/2023 1140   RBC 4.99 09/20/2023 1140   HGB 16.7 09/20/2023 1140   HGB 18.7 (H) 09/25/2021 1402   HCT 48.3 09/20/2023 1140   HCT 53.0 (H) 09/25/2021 1402   PLT 245 09/20/2023 1140   PLT 160 09/25/2021 1402  MCV 96.8 09/20/2023 1140   MCV 97 09/25/2021 1402   MCH 33.5 09/20/2023 1140   MCHC 34.6 09/20/2023 1140   RDW 13.2 09/20/2023 1140   RDW 14.5 09/25/2021 1402   LYMPHSABS 1.2 12/10/2020 1135   MONOABS 0.4 12/10/2020 1135   EOSABS 0.2 12/10/2020 1135   BASOSABS 0.0 12/10/2020 1135      Latest Ref Rng & Units 11/22/2023   12:01 PM 09/30/2023    2:05 PM 09/20/2023   11:40 AM  BMP  Glucose 70 - 99 mg/dL 93  93  81   BUN 6 - 20 mg/dL 17  17  18    Creatinine 0.61 - 1.24 mg/dL 8.39  8.28  8.46   Sodium 135 - 145 mmol/L 137  138  138   Potassium 3.5 - 5.1 mmol/L  4.3  4.0  4.3   Chloride 98 - 111 mmol/L 101  104  103   CO2 22 - 32 mmol/L 25  25  26    Calcium  8.9 - 10.3 mg/dL 9.0  9.5  9.5    Chest imaging: CT Coronary Scan 09/10/21 Extensive calcified thoracic adenopathy with mild Perifissural nodularity, most consistent with the EMR history of sarcoidosis.   Pulmonary artery enlargement suggests pulmonary arterial hypertension.   Aortic Atherosclerosis  PFT:     No data to display         Labs:  Path:  Echo 09/05/21: LV 50-55% RV size is moderately dilated. RV size and function is normal.   Heart Catheterization:  Cardiac MRI 04/24/22 LVEF 50% with severe LV dilation.   Compared to outside reporting (prior study) Basal LGE persists with resolution of prior apical LGE. Clinically this may represent improvement of cardiac sarcoidosis activity on immunosuppressive therapy.   Mitral valve is suggestive of prior endocarditis. Free breathing study limited accurate MR quantification.  Assessment & Plan:   No diagnosis found.  Discussion: Pius Byrom is a 58 year old male, former smoker with history of sarcoidosis, rheumatoid arthritis, DVT, CKD III, OSA on CPAP and chronic systolic heart failure who is referred to pulmonary clinic for sarcoidosis.   We will check a high resolution CT chest scan to evaluate his lungs for the extent of sarcoidosis involvement. We will check pulmonary function tests at follow up visit.   He appears clinically stable at this time from a pulmonary standpoint. His cardiac function has improved with the extended steroid taper. No plans at this time for further immunosuppression treatment.  Follow up in 3 months with PFTs.  Dorn Chill, MD [facility] Office: [phone]   Current Outpatient Medications:    acetaminophen  (TYLENOL ) 500 MG tablet, Take 1,000 mg by mouth 4 (four) times daily as needed for mild pain., Disp: , Rfl:    adalimumab (HUMIRA, 2 PEN,) 40  MG/0.4ML pen, Inject 40 mg into the skin every 14 (fourteen) days., Disp: , Rfl:    albuterol  (PROVENTIL  HFA;VENTOLIN  HFA) 108 (90 Base) MCG/ACT inhaler, Inhale 1-2 puffs into the lungs every 6 (six) hours as needed for wheezing or shortness of breath., Disp: , Rfl:    amiodarone  (PACERONE ) 200 MG tablet, Take 1 tablet (200 mg total) by mouth daily., Disp: 90 tablet, Rfl: 3   ascorbic acid (VITAMIN C) 500 MG tablet, Take 500 mg by mouth daily., Disp: , Rfl:    azaTHIOprine  (IMURAN ) 50 MG tablet, Take 1 tablet (50 mg total) by mouth daily., Disp: , Rfl:    budesonide -formoterol  (SYMBICORT ) 160-4.5 MCG/ACT inhaler, Inhale  2 puffs into the lungs 2 (two) times daily., Disp: 10.2 each, Rfl: 6   Calcium  Carbonate-Vit D-Min (CALCIUM  1200) 1200-1000 MG-UNIT CHEW, Chew 1,200 mg by mouth every morning., Disp: 30 tablet, Rfl: 5   capsaicin  (ZOSTRIX) 0.025 % cream, Apply 1 application topically 2 (two) times daily as needed (skin care). , Disp: , Rfl:    carboxymethylcellulose (REFRESH PLUS) 0.5 % SOLN, Place 1 drop into both eyes 2 (two) times daily., Disp: , Rfl:    Cholecalciferol (VITAMIN D3) 50 MCG (2000 UT) capsule, Take 2,000 Units by mouth daily., Disp: , Rfl:    cyclobenzaprine  (FLEXERIL ) 10 MG tablet, Take 10 mg by mouth every 8 (eight) hours as needed for muscle spasms., Disp: , Rfl:    cycloSPORINE (RESTASIS) 0.05 % ophthalmic emulsion, Place 1 drop into both eyes 2 (two) times daily., Disp: , Rfl:    diclofenac Sodium (VOLTAREN) 1 % GEL, Apply 2 g topically 2 (two) times daily as needed (pain)., Disp: , Rfl:    diphenhydrAMINE  (BENADRYL ) 50 MG capsule, Take 50 mg by mouth at bedtime., Disp: , Rfl:    doxycycline (VIBRA-TABS) 100 MG tablet, Take 1 tablet by mouth 2 (two) times daily., Disp: , Rfl:    empagliflozin (JARDIANCE) 25 MG TABS tablet, Take 12.5 mg by mouth daily., Disp: , Rfl:    furosemide  (LASIX ) 20 MG tablet, 40 mg daily, alternating with 20 mg daily., Disp: 180 tablet, Rfl: 3    gabapentin  (NEURONTIN ) 400 MG capsule, Take 400-800 mg by mouth See admin instructions. 400 mg during the day, 800 mg at bedtime, Disp: , Rfl:    guaiFENesin (MUCINEX) 600 MG 12 hr tablet, Take 1 tablet by mouth every 12 (twelve) hours., Disp: , Rfl:    Ipratropium-Albuterol  (COMBIVENT) 20-100 MCG/ACT AERS respimat, Inhale 1 puff into the lungs as needed., Disp: , Rfl:    metoprolol  succinate (TOPROL  XL) 50 MG 24 hr tablet, Take 1.5 tablets (75 mg total) by mouth at bedtime., Disp: 90 tablet, Rfl: 3   Multiple Vitamins-Minerals (MULTIVITAMIN GUMMIES ADULT PO), Take 2 each by mouth daily., Disp: , Rfl:    nepafenac (ILEVRO) 0.3 % ophthalmic suspension, 1 drop daily., Disp: , Rfl:    omeprazole  (PRILOSEC) 20 MG capsule, Take 1 capsule (20 mg total) by mouth daily., Disp: 175 capsule, Rfl: 0   prednisoLONE  acetate (PRED FORTE ) 1 % ophthalmic suspension, Place 1 drop into both eyes 2 (two) times daily., Disp: , Rfl:    rivaroxaban (XARELTO) 20 MG TABS tablet, Take 20 mg by mouth daily., Disp: , Rfl:    sacubitril-valsartan (ENTRESTO ) 97-103 MG, Take 1 tablet by mouth 2 (two) times daily., Disp: , Rfl:    Semaglutide,0.25 or 0.5MG /DOS, 2 MG/3ML SOPN, Inject into the skin. TAKES EVERY MONDAY, Disp: , Rfl:    sildenafil (VIAGRA) 100 MG tablet, Take 50 mg by mouth daily as needed for erectile dysfunction., Disp: , Rfl:    spironolactone (ALDACTONE) 25 MG tablet, Take 25 mg by mouth daily., Disp: , Rfl:    testosterone cypionate (DEPOTESTOSTERONE CYPIONATE) 200 MG/ML injection, Inject 60 mg into the muscle every 14 (fourteen) days., Disp: , Rfl:    timolol  (TIMOPTIC -XR) 0.5 % ophthalmic gel-forming, Place 1 drop into both eyes daily., Disp: , Rfl:    traMADol  (ULTRAM ) 50 MG tablet, Take 50 mg by mouth 3 (three) times daily as needed for moderate pain. Maximum dose= 8 tablets per day For pain, Disp: , Rfl:    urea 10 % lotion, Apply  1 Application topically daily as needed for dry skin., Disp: , Rfl:     valACYclovir  (VALTREX ) 1000 MG tablet, Take 1,000 mg by mouth daily., Disp: , Rfl:

## 2024-02-08 ENCOUNTER — Telehealth (HOSPITAL_COMMUNITY): Payer: Self-pay | Admitting: *Deleted

## 2024-02-08 NOTE — Telephone Encounter (Signed)
 Attempted to call patient regarding upcoming cardiac PET appointment. Left message on voicemail with name and callback number  Larey Brick RN Navigator Cardiac Imaging [facility] [phone] Office [phone] Cell

## 2024-02-09 ENCOUNTER — Encounter: Payer: Self-pay | Admitting: Pulmonary Disease

## 2024-02-09 ENCOUNTER — Telehealth (HOSPITAL_COMMUNITY): Payer: Self-pay | Admitting: Emergency Medicine

## 2024-02-09 ENCOUNTER — Ambulatory Visit: Admitting: Pulmonary Disease

## 2024-02-09 VITALS — BP 127/72 | HR 81 | Ht 71.0 in | Wt 217.0 lb

## 2024-02-09 DIAGNOSIS — R590 Localized enlarged lymph nodes: Secondary | ICD-10-CM | POA: Diagnosis not present

## 2024-02-09 DIAGNOSIS — R0982 Postnasal drip: Secondary | ICD-10-CM

## 2024-02-09 DIAGNOSIS — D869 Sarcoidosis, unspecified: Secondary | ICD-10-CM

## 2024-02-09 DIAGNOSIS — G4733 Obstructive sleep apnea (adult) (pediatric): Secondary | ICD-10-CM

## 2024-02-09 DIAGNOSIS — R053 Chronic cough: Secondary | ICD-10-CM | POA: Diagnosis not present

## 2024-02-09 DIAGNOSIS — D860 Sarcoidosis of lung: Secondary | ICD-10-CM | POA: Diagnosis not present

## 2024-02-09 MED ORDER — FLUTICASONE PROPIONATE 50 MCG/ACT NA SUSP: 1.0000 | Freq: Every day | NASAL | 2 refills | Status: AC

## 2024-02-09 NOTE — Patient Instructions (Addendum)
 Continue symbicort  2 puffs twice daily - rinse mouth out after each use  Use albuterol  inhaler 1-2 puffs every 4-6 hours as needed  Start fluticasone nasal spray, 1 spray per nostril daily for the sinus/nose drainage  Schedule pulmonary function tests at the front desk   Follow up in 3 months

## 2024-02-09 NOTE — Telephone Encounter (Signed)
Attempted to call patient regarding upcoming cardiac PET appointment. Left message on voicemail with name and callback number Aidan Moten RN Navigator Cardiac Imaging [facility] [phone] Office [phone] Cell  

## 2024-02-09 NOTE — Assessment & Plan Note (Addendum)
 SABRA

## 2024-02-09 NOTE — Progress Notes (Signed)
 Established Patient Pulmonology Office Visit   Subjective:  Patient ID: Richard Shepherd, male    DOB: [birth year]  MRN: [id number]  CC:  Chief Complaint  Patient presents with   Medical Management of Chronic Issues    Pt states has cold x few days     Discussed the use of AI scribe software for clinical note transcription with the patient, who gave verbal consent to proceed.  History of Present Illness Richard Shepherd is a 58 year old male with sarcoidosis who presents for a follow-up visit.  He has sarcoidosis with high-resolution CT chest in April 2024 showing band-like scarring or atelectasis greatest in the left lower lobe, small benign-appearing fissural nodules, and enlarged calcified mediastinal and hilar lymph nodes consistent with nodal sarcoidosis, with mosaic attenuation suggesting small airways disease.  His breathing is stable. He has cough with phlegm that improved on a steroid inhaler but recurred and has improved again on Symbicort  2 puffs twice daily and albuterol  as needed. The cough worsened after a 3 to 4 day interruption of Symbicort  use during a trip to [place] .  He has no wheezing, exertional dyspnea, heartburn, or reflux. He has sinus congestion with postnasal drainage that he feels contributes to the cough. He is not using nasal sprays.  He is scheduled for a mitral valve procedure and was advised to complete a breathing test beforehand.        ROS    Current Outpatient Medications:    acetaminophen  (TYLENOL ) 500 MG tablet, Take 1,000 mg by mouth 4 (four) times daily as needed for mild pain., Disp: , Rfl:    adalimumab (HUMIRA, 2 PEN,) 40 MG/0.4ML pen, Inject 40 mg into the skin every 14 (fourteen) days., Disp: , Rfl:    albuterol  (PROVENTIL  HFA;VENTOLIN  HFA) 108 (90 Base) MCG/ACT inhaler, Inhale 1-2 puffs into the lungs every 6 (six) hours as needed for wheezing or shortness of breath., Disp: , Rfl:    amiodarone  (PACERONE ) 200 MG tablet, Take 1 tablet  (200 mg total) by mouth daily., Disp: 90 tablet, Rfl: 3   ascorbic acid (VITAMIN C) 500 MG tablet, Take 500 mg by mouth daily., Disp: , Rfl:    azaTHIOprine  (IMURAN ) 50 MG tablet, Take 1 tablet (50 mg total) by mouth daily., Disp: , Rfl:    budesonide -formoterol  (SYMBICORT ) 160-4.5 MCG/ACT inhaler, Inhale 2 puffs into the lungs 2 (two) times daily., Disp: 10.2 each, Rfl: 6   Calcium  Carbonate-Vit D-Min (CALCIUM  1200) 1200-1000 MG-UNIT CHEW, Chew 1,200 mg by mouth every morning., Disp: 30 tablet, Rfl: 5   capsaicin  (ZOSTRIX) 0.025 % cream, Apply 1 application topically 2 (two) times daily as needed (skin care). , Disp: , Rfl:    carboxymethylcellulose (REFRESH PLUS) 0.5 % SOLN, Place 1 drop into both eyes 2 (two) times daily., Disp: , Rfl:    Cholecalciferol (VITAMIN D3) 50 MCG (2000 UT) capsule, Take 2,000 Units by mouth daily., Disp: , Rfl:    cyclobenzaprine  (FLEXERIL ) 10 MG tablet, Take 10 mg by mouth every 8 (eight) hours as needed for muscle spasms., Disp: , Rfl:    cycloSPORINE (RESTASIS) 0.05 % ophthalmic emulsion, Place 1 drop into both eyes 2 (two) times daily., Disp: , Rfl:    diclofenac Sodium (VOLTAREN) 1 % GEL, Apply 2 g topically 2 (two) times daily as needed (pain)., Disp: , Rfl:    diphenhydrAMINE  (BENADRYL ) 50 MG capsule, Take 50 mg by mouth at bedtime., Disp: , Rfl:    empagliflozin (JARDIANCE) 25 MG TABS tablet,  Take 12.5 mg by mouth daily., Disp: , Rfl:    fluticasone  (FLONASE ) 50 MCG/ACT nasal spray, Place 1 spray into both nostrils daily., Disp: 16 g, Rfl: 2   furosemide  (LASIX ) 20 MG tablet, 40 mg daily, alternating with 20 mg daily., Disp: 180 tablet, Rfl: 3   gabapentin  (NEURONTIN ) 400 MG capsule, Take 400-800 mg by mouth See admin instructions. 400 mg during the day, 800 mg at bedtime, Disp: , Rfl:    guaiFENesin (MUCINEX) 600 MG 12 hr tablet, Take 1 tablet by mouth every 12 (twelve) hours., Disp: , Rfl:    metoprolol  succinate (TOPROL  XL) 50 MG 24 hr tablet, Take 1.5  tablets (75 mg total) by mouth at bedtime., Disp: 90 tablet, Rfl: 3   Multiple Vitamins-Minerals (MULTIVITAMIN GUMMIES ADULT PO), Take 2 each by mouth daily., Disp: , Rfl:    nepafenac (ILEVRO) 0.3 % ophthalmic suspension, 1 drop daily., Disp: , Rfl:    omeprazole  (PRILOSEC) 20 MG capsule, Take 1 capsule (20 mg total) by mouth daily., Disp: 175 capsule, Rfl: 0   prednisoLONE  acetate (PRED FORTE ) 1 % ophthalmic suspension, Place 1 drop into both eyes 2 (two) times daily., Disp: , Rfl:    rivaroxaban (XARELTO) 20 MG TABS tablet, Take 20 mg by mouth daily., Disp: , Rfl:    sacubitril-valsartan (ENTRESTO ) 97-103 MG, Take 1 tablet by mouth 2 (two) times daily., Disp: , Rfl:    Semaglutide,0.25 or 0.5MG /DOS, 2 MG/3ML SOPN, Inject into the skin. TAKES EVERY MONDAY, Disp: , Rfl:    sildenafil (VIAGRA) 100 MG tablet, Take 50 mg by mouth daily as needed for erectile dysfunction., Disp: , Rfl:    spironolactone (ALDACTONE) 25 MG tablet, Take 25 mg by mouth daily., Disp: , Rfl:    testosterone cypionate (DEPOTESTOSTERONE CYPIONATE) 200 MG/ML injection, Inject 60 mg into the muscle every 14 (fourteen) days., Disp: , Rfl:    timolol  (TIMOPTIC -XR) 0.5 % ophthalmic gel-forming, Place 1 drop into both eyes daily., Disp: , Rfl:    traMADol  (ULTRAM ) 50 MG tablet, Take 50 mg by mouth 3 (three) times daily as needed for moderate pain. Maximum dose= 8 tablets per day For pain, Disp: , Rfl:    urea 10 % lotion, Apply 1 Application topically daily as needed for dry skin., Disp: , Rfl:    valACYclovir  (VALTREX ) 1000 MG tablet, Take 1,000 mg by mouth daily., Disp: , Rfl:       Objective:  BP 127/72   Pulse 81   Ht 5' 11 (1.803 m) Comment: per pt  Wt 217 lb (98.4 kg)   SpO2 95%   BMI 30.27 kg/m     Physical Exam Constitutional:      General: He is not in acute distress.    Appearance: Normal appearance.  Eyes:     General: No scleral icterus.    Conjunctiva/sclera: Conjunctivae normal.  Cardiovascular:      Rate and Rhythm: Normal rate and regular rhythm.  Pulmonary:     Breath sounds: No wheezing, rhonchi or rales.  Musculoskeletal:     Right lower leg: No edema.     Left lower leg: No edema.  Skin:    General: Skin is warm and dry.  Neurological:     General: No focal deficit present.      Diagnostic Review:  Last CBC Lab Results  Component Value Date   WBC 5.1 09/20/2023   HGB 16.7 09/20/2023   HCT 48.3 09/20/2023   MCV 96.8 09/20/2023   MCH  33.5 09/20/2023   RDW 13.2 09/20/2023   PLT 245 09/20/2023   Last metabolic panel Lab Results  Component Value Date   GLUCOSE 93 11/22/2023   NA 137 11/22/2023   K 4.3 11/22/2023   CL 101 11/22/2023   CO2 25 11/22/2023   BUN 17 11/22/2023   CREATININE 1.60 (H) 11/22/2023   GFRNONAA 50 (L) 11/22/2023   CALCIUM  9.0 11/22/2023   PROT 7.2 11/22/2023   ALBUMIN 3.5 11/22/2023   LABGLOB 3.1 09/04/2021   AGRATIO 1.4 09/04/2021   BILITOT 0.5 11/22/2023   ALKPHOS 53 11/22/2023   AST 26 11/22/2023   ALT 15 11/22/2023   ANIONGAP 11 11/22/2023       Assessment & Plan:   Assessment & Plan Sarcoidosis     OSA (obstructive sleep apnea)     Chronic cough     Post-nasal drip  Orders:   fluticasone (FLONASE) 50 MCG/ACT nasal spray; Place 1 spray into both nostrils daily.   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 CT showed scarring, benign nodules, and calcified lymph nodes consistent with nodal sarcoidosis. No new concerning findings. Symptoms include chronic cough and phlegm, likely related to sarcoidosis and postnasal drip. - Continue Symbicort  inhaler, two puffs twice a day. - Use albuterol  inhaler as needed. - Scheduled breathing tests to assess pulmonary function.  Chronic cough and postnasal drip Likely secondary to sinus congestion and drainage. Cough worsened after  discontinuing Symbicort . No wheezing, exertional dyspnea, heartburn, or reflux. Sinus congestion noted. - Prescribed Flonase nasal spray, one spray per nostril once daily. - Advised to monitor for epistaxis as a potential side effect of Flonase. - Continue Symbicort  inhaler, two puffs twice a day. - Use albuterol  inhaler as needed.      Return in about 3 months (around 05/09/2024) for f/u visit Dr. Kara.   Dorn KATHEE Kara, MD

## 2024-02-10 ENCOUNTER — Encounter (HOSPITAL_COMMUNITY)
Admission: RE | Admit: 2024-02-10 | Discharge: 2024-02-10 | Disposition: A | Source: Ambulatory Visit | Attending: Physician Assistant | Admitting: Physician Assistant

## 2024-02-14 ENCOUNTER — Telehealth (HOSPITAL_COMMUNITY): Payer: Self-pay | Admitting: Cardiology

## 2024-02-14 NOTE — Telephone Encounter (Signed)
 Called to confirm/remind patient of their appointment at [facility] on 02/14/24 .   Appointment:   [] Confirmed  [x] Left mess   [] No answer/No voice mail  [] VM Full/unable to leave message  [] Phone not in service  Patient reminded to bring all medications and/or complete list.  Confirmed patient has transportation. Gave directions, instructed to utilize valet parking.

## 2024-02-15 ENCOUNTER — Ambulatory Visit (HOSPITAL_COMMUNITY): Admitting: Cardiology

## 2024-02-16 ENCOUNTER — Encounter (HOSPITAL_COMMUNITY): Admitting: Cardiology

## 2024-02-24 ENCOUNTER — Telehealth (HOSPITAL_COMMUNITY): Payer: Self-pay | Admitting: Cardiology

## 2024-03-31 ENCOUNTER — Ambulatory Visit (HOSPITAL_COMMUNITY)
Admission: EM | Admit: 2024-03-31 | Discharge: 2024-03-31 | Disposition: A | Discharge status: Home / Self Care | Attending: Emergency Medicine | Admitting: Emergency Medicine

## 2024-03-31 ENCOUNTER — Encounter (HOSPITAL_COMMUNITY): Payer: Self-pay | Admitting: Emergency Medicine

## 2024-03-31 DIAGNOSIS — L234 Allergic contact dermatitis due to dyes: Secondary | ICD-10-CM | POA: Diagnosis not present

## 2024-03-31 DIAGNOSIS — H1031 Unspecified acute conjunctivitis, right eye: Secondary | ICD-10-CM | POA: Diagnosis not present

## 2024-03-31 MED ORDER — CETIRIZINE HCL 10 MG PO TABS
ORAL_TABLET | ORAL | Status: AC
  Filled 2024-03-31: qty 1

## 2024-03-31 MED ORDER — PREDNISONE 10 MG (21) PO TBPK: ORAL_TABLET | Freq: Every day | ORAL | 0 refills | Status: AC

## 2024-03-31 MED ORDER — FAMOTIDINE 20 MG PO TABS
20.0000 mg | ORAL_TABLET | Freq: Once | ORAL | Status: AC
  Administered 2024-03-31: 20 mg via ORAL

## 2024-03-31 MED ORDER — FAMOTIDINE 20 MG PO TABS
ORAL_TABLET | ORAL | Status: AC
  Filled 2024-03-31: qty 1

## 2024-03-31 MED ORDER — CETIRIZINE HCL 10 MG PO TABS
10.0000 mg | ORAL_TABLET | Freq: Every day | ORAL | Status: DC
  Administered 2024-03-31: 10 mg via ORAL

## 2024-03-31 MED ORDER — OFLOXACIN 0.3 % OP SOLN: 1.0000 [drp] | Freq: Four times a day (QID) | OPHTHALMIC | 0 refills | Status: AC

## 2024-03-31 MED ORDER — CETIRIZINE HCL 10 MG PO TABS: 10.0000 mg | ORAL_TABLET | Freq: Every day | ORAL | 0 refills | Status: AC

## 2024-03-31 NOTE — ED Provider Notes (Signed)
 " [facility]    CSN: [id number] Arrival date & time: 03/31/24  1125      History   Chief Complaint Chief Complaint  Patient presents with   Allergic Reaction    HPI Richard Shepherd is a 59 y.o. male.   Patient presents to clinic over concern of allergic reaction to a beard dye.  He got a dye off of Instagram and that was marketed for sensitive skin.  He used it last night wash it off 10 minutes later, as advised.  This morning he woke up and noticed that he is having facial swelling around his beard and intense itching.  Denies trouble breathing or trouble swallowing.  Without shortness of breath.  Has not tried medications or interventions prior to arrival.  Also having right eye redness.  This has been ongoing for the past few weeks.  Does have some vision changes to the eye with a history of iritis and uveitis as well as sarcoidosis.  Feels like the right eye has been bloody for some time now and it is gradually improving.  Has had some drainage.    The history is provided by the patient and medical records.  Allergic Reaction   Past Medical History:  Diagnosis Date   Arthritis    all over (08/31/2017)   Asthma    Chest pain    Chronic lower back pain    CKD (chronic kidney disease), stage III (HCC)    thelbert 08/31/2017   DVT (deep venous thrombosis) (HCC) < & 06/2006   a. mainly affect R leg   Endocarditis 2018   Fibromyalgia    Heart murmur    OSA on CPAP    Pericarditis 2017   Positive cardiac stress test    Sarcoidosis    a. eye involvement only    Patient Active Problem List   Diagnosis Date Noted   Mitral valve insufficiency 03/30/2023   Bleeding from mouth 08/31/2017   History of endocarditis 08/31/2017   Asthma 08/31/2017   GERD (gastroesophageal reflux disease) 08/31/2017   Acute renal failure superimposed on stage 3 chronic kidney disease (HCC) 08/31/2017   Peroneal tendonitis, left 07/29/2017   Anticoagulation adequate 11/06/2015    Mixed hyperlipidemia 11/06/2015   Hyperglycemia 10/28/2015   Morbid obesity due to excess calories (HCC) 10/28/2015   OSA (obstructive sleep apnea) 10/28/2015   Chest pain 08/16/2013   Pericarditis 08/16/2013   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   PCO (posterior capsular opacification) 11/22/2012   Pseudophakia 11/22/2012   LEG EDEMA 10/17/2007   Sarcoidosis 06/22/2006   DVT (deep venous thrombosis) (HCC) 06/22/2006   Chronic kidney disease 06/22/2006    Past Surgical History:  Procedure Laterality Date   CATARACT EXTRACTION W/ INTRAOCULAR LENS  IMPLANT, BILATERAL Bilateral 2012   ENDOVENOUS ABLATION SAPHENOUS VEIN W/ LASER Right 05/29/2020   endovenous laser ablation right greater saphenous vein and stab phlebectomy > 20 incisions by Carlin Haddock MD    EYE SURGERY Bilateral 2015   scraped calcium  from cornea   GLAUCOMA SURGERY Bilateral 2012   RIGHT/LEFT HEART CATH AND CORONARY ANGIOGRAPHY N/A 04/12/2023   Procedure: RIGHT/LEFT HEART CATH AND CORONARY ANGIOGRAPHY;  Surgeon: Rolan Ezra RAMAN, MD;  Location: [facility] INVASIVE CV LAB;  Service: Cardiovascular;  Laterality: N/A;   TRANSESOPHAGEAL ECHOCARDIOGRAM (CATH LAB) N/A 03/30/2023   Procedure: TRANSESOPHAGEAL ECHOCARDIOGRAM;  Surgeon: Rolan Ezra RAMAN, MD;  Location: [facility] INVASIVE CV LAB;  Service: Cardiovascular;  Laterality: N/A;       Home Medications  Prior to Admission medications  Medication Sig Start Date End Date Taking? Authorizing Provider  cetirizine (ZYRTEC) 10 MG tablet Take 1 tablet (10 mg total) by mouth daily. 03/31/24  Yes Ball, Aleem Elza  G, FNP  ofloxacin (OCUFLOX) 0.3 % ophthalmic solution Place 1 drop into the right eye 4 (four) times daily for 5 days. 03/31/24 04/05/24 Yes Ball, Darlean Warmoth  G, FNP  predniSONE  (STERAPRED UNI-PAK 21 TAB) 10 MG (21) TBPK tablet Take by mouth daily. Take as directed. 03/31/24  Yes Ball, Halim Surrette  G, FNP  acetaminophen  (TYLENOL ) 500 MG tablet Take 1,000 mg by mouth 4 (four) times daily as needed for  mild pain.    [provider]  adalimumab (HUMIRA, 2 PEN,) 40 MG/0.4ML pen Inject 40 mg into the skin every 14 (fourteen) days.    [provider]  albuterol  (PROVENTIL  HFA;VENTOLIN  HFA) 108 (90 Base) MCG/ACT inhaler Inhale 1-2 puffs into the lungs every 6 (six) hours as needed for wheezing or shortness of breath.    [provider]  amiodarone  (PACERONE ) 200 MG tablet Take 1 tablet (200 mg total) by mouth daily. 11/01/23   Glena Harlene HERO, FNP  ascorbic acid (VITAMIN C) 500 MG tablet Take 500 mg by mouth daily.    [provider]  azaTHIOprine  (IMURAN ) 50 MG tablet Take 1 tablet (50 mg total) by mouth daily. 11/22/23   Colletta Manuelita Garre, PA-C  budesonide -formoterol  (SYMBICORT ) 160-4.5 MCG/ACT inhaler Inhale 2 puffs into the lungs 2 (two) times daily. 11/04/23   Charley Conger, PA-C  Calcium  Carbonate-Vit D-Min (CALCIUM  1200) 1200-1000 MG-UNIT CHEW Chew 1,200 mg by mouth every morning. 09/10/21 11/21/24  Cindie Ole DASEN, MD  capsaicin  (ZOSTRIX) 0.025 % cream Apply 1 application topically 2 (two) times daily as needed (skin care).     [provider]  carboxymethylcellulose (REFRESH PLUS) 0.5 % SOLN Place 1 drop into both eyes 2 (two) times daily.    [provider]  Cholecalciferol (VITAMIN D3) 50 MCG (2000 UT) capsule Take 2,000 Units by mouth daily.    [provider]  cyclobenzaprine  (FLEXERIL ) 10 MG tablet Take 10 mg by mouth every 8 (eight) hours as needed for muscle spasms.    [provider]  cycloSPORINE (RESTASIS) 0.05 % ophthalmic emulsion Place 1 drop into both eyes 2 (two) times daily. 12/18/20   [provider]  diclofenac Sodium (VOLTAREN) 1 % GEL Apply 2 g topically 2 (two) times daily as needed (pain). 09/16/20   [provider]  diphenhydrAMINE  (BENADRYL ) 50 MG capsule Take 50 mg by mouth at bedtime.    [provider]  empagliflozin (JARDIANCE) 25 MG TABS tablet Take 12.5 mg by mouth  daily. 01/03/21   [provider]  fluticasone  (FLONASE ) 50 MCG/ACT nasal spray Place 1 spray into both nostrils daily. 02/09/24   Kara Dorn NOVAK, MD  furosemide  (LASIX ) 20 MG tablet 40 mg daily, alternating with 20 mg daily. 09/20/23   Rolan Ezra RAMAN, MD  gabapentin  (NEURONTIN ) 400 MG capsule Take 400-800 mg by mouth See admin instructions. 400 mg during the day, 800 mg at bedtime 04/08/20   [provider]  guaiFENesin (MUCINEX) 600 MG 12 hr tablet Take 1 tablet by mouth every 12 (twelve) hours. 07/20/23   [provider]  metoprolol  succinate (TOPROL  XL) 50 MG 24 hr tablet Take 1.5 tablets (75 mg total) by mouth at bedtime. 01/28/23   Rolan Ezra RAMAN, MD  Multiple Vitamins-Minerals (MULTIVITAMIN GUMMIES ADULT PO) Take 2 each by mouth daily.  [provider]  nepafenac (ILEVRO) 0.3 % ophthalmic suspension 1 drop daily. 04/05/20   [provider]  omeprazole  (PRILOSEC) 20 MG capsule Take 1 capsule (20 mg total) by mouth daily. 09/10/21 11/21/24  Cindie Ole DASEN, MD  prednisoLONE  acetate (PRED FORTE ) 1 % ophthalmic suspension Place 1 drop into both eyes 2 (two) times daily.    [provider]  rivaroxaban (XARELTO) 20 MG TABS tablet Take 20 mg by mouth daily. 06/29/22   [provider]  sacubitril-valsartan (ENTRESTO ) 97-103 MG Take 1 tablet by mouth 2 (two) times daily. 06/10/23   [provider]  Semaglutide,0.25 or 0.5MG /DOS, 2 MG/3ML SOPN Inject into the skin. TAKES EVERY MONDAY 05/04/23   [provider]  sildenafil (VIAGRA) 100 MG tablet Take 50 mg by mouth daily as needed for erectile dysfunction.    [provider]  spironolactone (ALDACTONE) 25 MG tablet Take 25 mg by mouth daily. 07/17/20   [provider]  testosterone cypionate (DEPOTESTOSTERONE CYPIONATE) 200 MG/ML injection Inject 60 mg into the muscle every 14 (fourteen) days. 12/03/20   [provider]  timolol  (TIMOPTIC -XR) 0.5 %  ophthalmic gel-forming Place 1 drop into both eyes daily.    [provider]  traMADol  (ULTRAM ) 50 MG tablet Take 50 mg by mouth 3 (three) times daily as needed for moderate pain. Maximum dose= 8 tablets per day For pain    [provider]  urea 10 % lotion Apply 1 Application topically daily as needed for dry skin. 01/12/21   [provider]  valACYclovir  (VALTREX ) 1000 MG tablet Take 1,000 mg by mouth daily.    [provider]    Family History Family History  Problem Relation Age of Onset   Breast cancer Mother    Multiple sclerosis Father    Multiple sclerosis Brother    Diabetes Brother     Social History Social History[1]   Allergies   Other and Humira (2 pen) [adalimumab]   Review of Systems Review of Systems  Per HPI  Physical Exam Triage Vital Signs ED Triage Vitals [03/31/24 1300]  Encounter Vitals Group     BP 110/72     Girls Systolic BP Percentile      Girls Diastolic BP Percentile      Boys Systolic BP Percentile      Boys Diastolic BP Percentile      Pulse Rate 76     Resp 18     Temp 98.1 F (36.7 C)     Temp Source Oral     SpO2 94 %     Weight      Height      Head Circumference      Peak Flow      Pain Score      Pain Loc      Pain Education      Exclude from Growth Chart    No data found.  Updated Vital Signs BP 110/72 (BP Location: Right Arm)   Pulse 76   Temp 98.1 F (36.7 C) (Oral)   Resp 18   SpO2 94%   Visual Acuity Right Eye Distance:   Left Eye Distance:   Bilateral Distance:    Right Eye Near:   Left Eye Near:    Bilateral Near:     Physical Exam Vitals and nursing note reviewed.  Constitutional:      Appearance: Normal appearance.  HENT:     Head: Normocephalic and atraumatic.  Right Ear: External ear normal.     Left Ear: External ear normal.     Nose: Nose normal.     Mouth/Throat:     Mouth: Mucous membranes are moist.  Eyes:     General: Lids are normal. Lids are  everted, no foreign bodies appreciated. Vision grossly intact. Gaze aligned appropriately.     Conjunctiva/sclera:     Right eye: Right conjunctiva is injected. Hemorrhage present.  Cardiovascular:     Rate and Rhythm: Normal rate.  Pulmonary:     Effort: Pulmonary effort is normal. No respiratory distress.  Musculoskeletal:        General: Normal range of motion.  Skin:    General: Skin is warm and dry.     Findings: Rash present.      Neurological:     General: No focal deficit present.     Mental Status: He is alert.  Psychiatric:        Mood and Affect: Mood normal.      UC Treatments / Results  Labs (all labs ordered are listed, but only abnormal results are displayed) Labs Reviewed - No data to display  EKG   Radiology No results found.  Procedures Procedures (including critical care time)  Medications Ordered in UC Medications  cetirizine (ZYRTEC) tablet 10 mg (has no administration in time range)  famotidine (PEPCID) tablet 20 mg (has no administration in time range)    Initial Impression / Assessment and Plan / UC Course  I have reviewed the triage vital signs and the nursing notes.  Pertinent labs & imaging results that were available during my care of the patient were reviewed by me and considered in my medical decision making (see chart for details).  Vitals and triage reviewed, patient is hemodynamically stable.  Right conjunctival with injection and hemorrhage.  Without obvious discharge and drainage.  PERRLA.  Will trial ofloxacin to cover for bacterial etiology and encourage strict ophthalmology follow-up if no improvement.  Contact dermatitis around beard and mustache area.  Given Pepcid and cetirizine in clinic.  Steroid taper prescribed.   advised avoidance of skin irritants in the future.  Allergic reaction is localized.  Without systemic involvement.  Plan of care, follow-up care, and return precautions given, no questions at this time.     Final Clinical Impressions(s) / UC Diagnoses   Final diagnoses:  Allergic contact dermatitis due to dyes  Acute conjunctivitis of right eye, unspecified acute conjunctivitis type     Discharge Instructions      Take the cetirizine daily to help with itching.  Start the steroid Dosepak today and take it as prescribed.  Ideally in the morning with breakfast because this can increase energy and appetite. Avoid any further dye to the beard. Avoid itching as it can lead to infection.   It is unclear what is causing the redness to your eye.  Use the eyedrops 4 times daily for 5 days.  If no improvement in the eye redness or any changes follow-up with your eye doctor on Monday.       ED Prescriptions     Medication Sig Dispense Auth. Provider   ofloxacin (OCUFLOX) 0.3 % ophthalmic solution Place 1 drop into the right eye 4 (four) times daily for 5 days. 5 mL Ball, Dontrae Morini  G, FNP   cetirizine (ZYRTEC) 10 MG tablet Take 1 tablet (10 mg total) by mouth daily. 30 tablet Ball, Elyana Grabski  G, FNP   predniSONE  (STERAPRED UNI-PAK 21 TAB) 10 MG (  21) TBPK tablet Take by mouth daily. Take as directed. 21 tablet Ball, Tokiko Diefenderfer  G, FNP      PDMP not reviewed this encounter.     [1]  Social History Tobacco Use   Smoking status: Former    Current packs/day: 0.12    Average packs/day: 0.1 packs/day for 4.0 years (0.5 ttl pk-yrs)    Types: Cigarettes   Smokeless tobacco: Never   Tobacco comments:    08/21/2017 stopped in the 1990s  Vaping Use   Vaping status: Never Used  Substance Use Topics   Alcohol  use: Yes    Comment: 08/31/2017 might have a margarita once/month; if that   Drug use: Never     Mercer Louann MATSU, FNP 03/31/24 1355  "

## 2024-03-31 NOTE — ED Triage Notes (Addendum)
 Patient presents for red and swollen area on beard area x 1 day.  Patient thinks he is having a allergic reaction to a hair dye he used last night.  Patient denies throat swelling.  Patient has not taken any medication.

## 2024-03-31 NOTE — Discharge Instructions (Signed)
 Take the cetirizine daily to help with itching.  Start the steroid Dosepak today and take it as prescribed.  Ideally in the morning with breakfast because this can increase energy and appetite. Avoid any further dye to the beard. Avoid itching as it can lead to infection.   It is unclear what is causing the redness to your eye.  Use the eyedrops 4 times daily for 5 days.  If no improvement in the eye redness or any changes follow-up with your eye doctor on Monday.

## 2024-05-24 ENCOUNTER — Encounter

## 2024-05-24 ENCOUNTER — Ambulatory Visit: Admitting: Pulmonary Disease

## 2024-05-25 ENCOUNTER — Ambulatory Visit: Admitting: Pulmonary Disease
